# Patient Record
Sex: Male | Born: 1985
Health system: Southern US, Community
[De-identification: ages and names within clinical notes are randomized; demographics above are authoritative.]

## PROBLEM LIST (undated history)

## (undated) ENCOUNTER — Emergency Department (HOSPITAL_COMMUNITY): Admission: EM | Payer: Self-pay | Source: Home / Self Care

## (undated) DIAGNOSIS — S3992XA Unspecified injury of lower back, initial encounter: Secondary | ICD-10-CM

## (undated) DIAGNOSIS — R569 Unspecified convulsions: Secondary | ICD-10-CM

## (undated) HISTORY — DX: Unspecified convulsions: R56.9

---
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
## 2010-07-06 ENCOUNTER — Encounter: Payer: Self-pay | Admitting: Sports Medicine

## 2011-05-10 ENCOUNTER — Emergency Department (HOSPITAL_BASED_OUTPATIENT_CLINIC_OR_DEPARTMENT_OTHER)
Admission: EM | Admit: 2011-05-10 | Discharge: 2011-05-10 | Disposition: A | Discharge status: Home / Self Care | Payer: BC Managed Care – PPO | Attending: Emergency Medicine | Admitting: Emergency Medicine

## 2011-05-10 ENCOUNTER — Encounter: Payer: Self-pay | Admitting: *Deleted

## 2011-05-10 DIAGNOSIS — R112 Nausea with vomiting, unspecified: Secondary | ICD-10-CM | POA: Insufficient documentation

## 2011-05-10 DIAGNOSIS — R109 Unspecified abdominal pain: Secondary | ICD-10-CM | POA: Insufficient documentation

## 2011-05-10 LAB — URINALYSIS, ROUTINE W REFLEX MICROSCOPIC
Bilirubin Urine: NEGATIVE
Glucose, UA: NEGATIVE mg/dL
Ketones, ur: NEGATIVE mg/dL
Leukocytes, UA: NEGATIVE
Nitrite: NEGATIVE
Specific Gravity, Urine: 1.028 (ref 1.005–1.030)
pH: 7.5 (ref 5.0–8.0)

## 2011-05-10 LAB — BASIC METABOLIC PANEL
GFR calc Af Amer: >90 mL/min (ref >90)
GFR calc non Af Amer: >90 mL/min (ref >90)
Potassium: 3.8 mEq/L (ref 3.5–5.1)
Sodium: 135 mEq/L (ref 135–145)

## 2011-05-10 MED ORDER — SODIUM CHLORIDE 0.9 % IV BOLUS (SEPSIS)
1000.0000 mL | Freq: Once | INTRAVENOUS | Status: AC
  Administered 2011-05-10: 1000 mL via INTRAVENOUS

## 2011-05-10 MED ORDER — ONDANSETRON HCL 4 MG/2ML IJ SOLN
4.0000 mg | Freq: Once | INTRAMUSCULAR | Status: AC
  Administered 2011-05-10: 4 mg via INTRAVENOUS
  Filled 2011-05-10: qty 2

## 2011-05-10 MED ORDER — ONDANSETRON 4 MG PO TBDP: 4.0000 mg | ORAL_TABLET | Freq: Three times a day (TID) | ORAL | Status: AC | PRN

## 2011-05-10 NOTE — ED Notes (Signed)
Patient states that the nausea has subsided and does not feel dizzy

## 2011-05-10 NOTE — ED Notes (Signed)
Patient drinking ginger ale w/o nausea or vomiting

## 2011-05-10 NOTE — ED Provider Notes (Signed)
Medical screening examination/treatment/procedure(s) were performed by non-physician practitioner and as supervising physician I was immediately available for consultation/collaboration.   Geoffery Lyons, MD 05/10/11 [phone]

## 2011-05-10 NOTE — ED Provider Notes (Signed)
History     CSN: [id number] Arrival date & time: 05/10/2011  3:57 PM   First MD Initiated Contact with Patient 05/10/11 1601      Chief Complaint  Patient presents with  . Abdominal Pain    (Consider location/radiation/quality/duration/timing/severity/associated sxs/prior treatment) HPI Comments: Pt states that she is having some generalized abdominal cramping:pt states that he is having decreased vomiting over the last couple of hours  Patient is a 25 y.o. male presenting with abdominal pain. The history is provided by the patient. No language interpreter was used.  Abdominal Pain The primary symptoms of the illness include abdominal pain, nausea, vomiting and diarrhea. The primary symptoms of the illness do not include fever or dysuria. The current episode started yesterday. The onset of the illness was sudden. The problem has been gradually improving.  The patient has not had a change in bowel habit. Symptoms associated with the illness do not include chills, frequency or back pain.    History reviewed. No pertinent past medical history.  History reviewed. No pertinent past surgical history.  History reviewed. No pertinent family history.  History  Substance Use Topics  . Smoking status: Not on file  . Smokeless tobacco: Not on file  . Alcohol Use: Not on file      Review of Systems  Constitutional: Negative for fever and chills.  Gastrointestinal: Positive for nausea, vomiting, abdominal pain and diarrhea.  Genitourinary: Negative for dysuria and frequency.  Musculoskeletal: Negative for back pain.  All other systems reviewed and are negative.    Allergies  Review of patient's allergies indicates no known allergies.  Home Medications  No current outpatient prescriptions on file.  BP 139/82  Pulse 96  Temp(Src) 99 F (37.2 C) (Oral)  Resp 20  Ht 6\' 2"  (1.88 m)  Wt 281 lb 9 oz (127.716 kg)  BMI 36.15 kg/m2  SpO2 99%  Physical Exam  Nursing note and  vitals reviewed. Constitutional: He is oriented to person, place, and time. He appears well-developed and well-nourished.  HENT:  Head: Normocephalic and atraumatic.  Neck: Normal range of motion.  Cardiovascular: Normal rate and regular rhythm.   Pulmonary/Chest: Effort normal and breath sounds normal.  Abdominal: Soft. Bowel sounds are normal.  Musculoskeletal: Normal range of motion.  Neurological: He is alert and oriented to person, place, and time.  Skin: Skin is warm and dry.  Psychiatric: He has a normal mood and affect.    ED Course  Procedures (including critical care time)   Labs Reviewed  URINALYSIS, ROUTINE W REFLEX MICROSCOPIC  BASIC METABOLIC PANEL   No results found.   1. Nausea vomiting and diarrhea       MDM  Pt is tolerating po at this time:pt feeling better:abdomen non tender with palpation:symptoms likely viral        Teressa Lower, NP 05/10/11 1813

## 2011-05-10 NOTE — ED Notes (Signed)
Pt states he has had abd pain, N/V/D since last p.m.

## 2011-09-07 ENCOUNTER — Encounter (HOSPITAL_BASED_OUTPATIENT_CLINIC_OR_DEPARTMENT_OTHER): Payer: Self-pay | Admitting: *Deleted

## 2011-09-07 ENCOUNTER — Emergency Department (HOSPITAL_BASED_OUTPATIENT_CLINIC_OR_DEPARTMENT_OTHER)
Admission: EM | Admit: 2011-09-07 | Discharge: 2011-09-07 | Disposition: A | Discharge status: Home / Self Care | Payer: Self-pay | Attending: Emergency Medicine | Admitting: Emergency Medicine

## 2011-09-07 DIAGNOSIS — K529 Noninfective gastroenteritis and colitis, unspecified: Secondary | ICD-10-CM

## 2011-09-07 DIAGNOSIS — K5289 Other specified noninfective gastroenteritis and colitis: Secondary | ICD-10-CM | POA: Insufficient documentation

## 2011-09-07 MED ORDER — ONDANSETRON 8 MG PO TBDP
8.0000 mg | ORAL_TABLET | Freq: Once | ORAL | Status: AC
  Administered 2011-09-07: 8 mg via ORAL
  Filled 2011-09-07: qty 1

## 2011-09-07 MED ORDER — ONDANSETRON 8 MG PO TBDP: 8.0000 mg | ORAL_TABLET | Freq: Three times a day (TID) | ORAL | Status: AC | PRN

## 2011-09-07 MED ORDER — DIPHENOXYLATE-ATROPINE 2.5-0.025 MG PO TABS
2.0000 | ORAL_TABLET | Freq: Once | ORAL | Status: AC
  Administered 2011-09-07: 2 via ORAL
  Filled 2011-09-07: qty 2

## 2011-09-07 MED ORDER — DIPHENOXYLATE-ATROPINE 2.5-0.025 MG PO TABS: 1.0000 | ORAL_TABLET | Freq: Four times a day (QID) | ORAL | Status: AC | PRN

## 2011-09-07 NOTE — ED Notes (Signed)
Patient states he has had N/V/D since yesterday. Unable to keep anything down.

## 2011-09-07 NOTE — ED Provider Notes (Signed)
History     CSN: [id number]  Arrival date & time 09/07/11  0304   First MD Initiated Contact with Patient 09/07/11 [phone]      Chief Complaint  Patient presents with  . Nausea, vomiting and diarrhea     (Consider location/radiation/quality/duration/timing/severity/associated sxs/prior treatment) HPI Is a 26 year old black male with a history of nausea, vomiting and diarrhea since yesterday. He has not been able to keep anything down. He has not taken anything to treat this. He has had some abdominal cramping but this is improved. The symptoms overall are moderate in severity. He states the symptoms have kept him from sleeping this morning and that is why he is here. He requests only treatment by mouth and does not wish an IV.  History reviewed. No pertinent past medical history.  History reviewed. No pertinent past surgical history.  No family history on file.  History  Substance Use Topics  . Smoking status: Not on file  . Smokeless tobacco: Not on file  . Alcohol Use: Not on file      Review of Systems  All other systems reviewed and are negative.    Allergies  Review of patient's allergies indicates no known allergies.  Home Medications  No current outpatient prescriptions on file.  BP 142/75  Pulse 96  Temp(Src) 98.3 F (36.8 C) (Oral)  Resp 20  SpO2 98%  Physical Exam General: Well-developed, well-nourished male in no acute distress; appearance consistent with age of record HENT: normocephalic, atraumatic; mucous membranes moist Eyes: pupils equal round and reactive to light; extraocular muscles intact Neck: supple Heart: regular rate and rhythm Lungs: clear to auscultation bilaterally Abdomen: soft; nondistended; nontender; no masses or hepatosplenomegaly; bowel sounds present Extremities: No deformity; full range of motion; pulses normal Neurologic: Awake, alert and oriented; motor function intact in all extremities and symmetric; no facial  droop Skin: Warm and dry     ED Course  Procedures (including critical care time)     MDM  4:28 AM Drinking fluids without emesis.        Hanley Seamen, MD 09/07/11 [phone]

## 2011-09-19 ENCOUNTER — Encounter (HOSPITAL_BASED_OUTPATIENT_CLINIC_OR_DEPARTMENT_OTHER): Payer: Self-pay | Admitting: Emergency Medicine

## 2011-09-19 ENCOUNTER — Emergency Department (HOSPITAL_BASED_OUTPATIENT_CLINIC_OR_DEPARTMENT_OTHER)
Admission: EM | Admit: 2011-09-19 | Discharge: 2011-09-19 | Disposition: A | Discharge status: Home / Self Care | Payer: Self-pay | Attending: Emergency Medicine | Admitting: Emergency Medicine

## 2011-09-19 ENCOUNTER — Emergency Department (INDEPENDENT_AMBULATORY_CARE_PROVIDER_SITE_OTHER): Payer: Self-pay

## 2011-09-19 DIAGNOSIS — M546 Pain in thoracic spine: Secondary | ICD-10-CM | POA: Insufficient documentation

## 2011-09-19 DIAGNOSIS — M549 Dorsalgia, unspecified: Secondary | ICD-10-CM

## 2011-09-19 DIAGNOSIS — M545 Low back pain, unspecified: Secondary | ICD-10-CM | POA: Insufficient documentation

## 2011-09-19 DIAGNOSIS — M8448XA Pathological fracture, other site, initial encounter for fracture: Secondary | ICD-10-CM

## 2011-09-19 HISTORY — DX: Unspecified injury of lower back, initial encounter: S39.92XA

## 2011-09-19 MED ORDER — NAPROXEN 500 MG PO TABS: 500.0000 mg | ORAL_TABLET | Freq: Two times a day (BID) | ORAL | Status: AC

## 2011-09-19 MED ORDER — CYCLOBENZAPRINE HCL 5 MG PO TABS: 5.0000 mg | ORAL_TABLET | Freq: Three times a day (TID) | ORAL | Status: AC | PRN

## 2011-09-19 NOTE — ED Provider Notes (Signed)
History     CSN: [id number]  Arrival date & time 09/19/11  0865   First MD Initiated Contact with Patient 09/19/11 [phone]      Chief Complaint  Patient presents with  . Back Pain    (Consider location/radiation/quality/duration/timing/severity/associated sxs/prior treatment) HPI Pt states he has had pain in his back four days ago.  No known recent injury.  No numbness or tingling.  No difficulty with elimination.  The pain increases with movement.  NO trouble breathing, no  Shortness of breath or cough.  Pt has history of stress fractures in his vertebrae.   Pt was told it could have been from an accident in the past.  He had been seeing a chiropractor who gave him the diagnosis.  The pain is a 6/10 Past Medical History  Diagnosis Date  . Back injuries     History reviewed. No pertinent past surgical history.  History reviewed. No pertinent family history.  History  Substance Use Topics  . Smoking status: Current Some Day Smoker    Types: Cigars  . Smokeless tobacco: Not on file  . Alcohol Use: Yes     occasional      Review of Systems  All other systems reviewed and are negative.    Allergies  Review of patient's allergies indicates no known allergies.  Home Medications  No current outpatient prescriptions on file.  BP 132/82  Pulse 83  Temp(Src) 98.4 F (36.9 C) (Oral)  Resp 18  SpO2 99%  Physical Exam  Nursing note and vitals reviewed. Constitutional: He appears well-developed and well-nourished. No distress.  HENT:  Head: Normocephalic and atraumatic. Head is without raccoon's eyes and without Battle's sign.  Right Ear: External ear normal.  Left Ear: External ear normal.  Eyes: Conjunctivae and lids are normal. Right eye exhibits no discharge. Left eye exhibits no discharge. Right conjunctiva has no hemorrhage. Left conjunctiva has no hemorrhage. No scleral icterus.  Neck: Neck supple. No spinous process tenderness present. No tracheal deviation and no  edema present.  Pulmonary/Chest: Effort normal and breath sounds normal. No stridor. No respiratory distress. He exhibits no tenderness, no crepitus and no deformity.  Abdominal: Soft. Normal appearance and bowel sounds are normal. He exhibits no distension and no mass. There is no tenderness.  Musculoskeletal: He exhibits no edema and no tenderness.       Cervical back: He exhibits no tenderness, no swelling and no deformity.       Thoracic back: He exhibits tenderness. He exhibits no swelling and no deformity.       Lumbar back: He exhibits tenderness. He exhibits no swelling.       Back:       Pelvis stable, no ttp  Neurological: He is alert. He has normal strength. No cranial nerve deficit (no gross deficits) or sensory deficit. He exhibits normal muscle tone. GCS eye subscore is 4. GCS verbal subscore is 5. GCS motor subscore is 6.       Able to move all extremities, sensation intact throughout  Skin: Skin is warm and dry. No rash noted. He is not diaphoretic.  Psychiatric: He has a normal mood and affect. His speech is normal and behavior is normal.    ED Course  Procedures (including critical care time)  Labs Reviewed - No data to display Dg Thoracic Spine 2 View  09/19/2011  *RADIOLOGY REPORT*  Clinical Data: Back pain for 4 days, no recent injury  THORACIC SPINE - 2 VIEW  Comparison:  None.  Findings: Four views of thoracic spine submitted. Alignment and disc space are preserved.  There is mild decreased height of probable T8 and T10 vertebral body of indeterminate age.  Clinical correlation is necessary.  If recent compression deformities are suspected further evaluation with MRI is recommended.  IMPRESSION: Alignment and disc space are preserved.  There is mild decreased height of probable T8 and T10 vertebral body of indeterminate age. Clinical correlation is necessary.  If recent compression deformities are suspected further evaluation with MRI is recommended.  Original Report  Authenticated By: Natasha Mead, M.D.   Dg Lumbar Spine Complete  09/19/2011  *RADIOLOGY REPORT*  Clinical Data: 26 year old male with mid and low back pain.  Prior stress fractures.  LUMBAR SPINE - COMPLETE 4+ VIEW  Comparison: [facility] MRI 08/13/2005. Lumbar radiographs 07/30/2005.  Findings: Normal lumbar segmentation.  Chronic bilateral L5 pars fractures.  Stable lumbar height and alignment, no significant spondylolisthesis at L5-S1.  Relatively preserved disc space. Bone mineralization is within normal limits.  No other pars fracture. Negative SI joints.  IMPRESSION: No acute osseous abnormality in the lumbar spine.  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  Original Report Authenticated By: Harley Hallmark, M.D.     No diagnosis found.    MDM  The patient is noted to have compression fractures of T8 and T10. Patient is somewhat tenderness area every he states he has history of compression fractures in the same spot. I suspect these findings on the plain films are related to his prior diagnosis. Patient does have a sports medicine doctor that he sees.  I recommended he followup with his doctor so that they can review his old films and compared them to the ones performed today.       Celene Kras, MD 09/19/11 1102

## 2011-09-19 NOTE — ED Notes (Signed)
Pt having mid back pain x 4 days.  No known injury.  No difficulty with elimination.  No numbness or tingling.

## 2011-09-19 NOTE — Discharge Instructions (Signed)
Back, Compression Fracture A compression fracture happens when a force is put upon the length of your spine. Slipping and falling on your bottom are examples of such a force. When this happens, sometimes the force is great enough to compress the building blocks (vertebral bodies) of your spine. Although this causes a lot of pain, this can usually be treated at home, unless your caregiver feels hospitalization is needed for pain control. Your backbone (spinal column) is made up of 24 main vertebral bodies in addition to the sacrum and coccyx (see illustration). These are held together by tough fibrous tissues (ligaments) and by support of your muscles. Nerve roots pass through the openings between the vertebrae. A sudden wrenching move, injury, or a fall may cause a compression fracture of one of the vertebral bodies. This may result in back pain or spread of pain into the belly (abdomen), the buttocks, and down the leg into the foot. Pain may also be created by muscle spasm alone. Large studies have been undertaken to determine the best possible course of action to help your back following injury and also to prevent future problems. The recommendations are as follows. FOLLOWING A COMPRESSION FRACTURE: Do the following only if advised by your caregiver.   If a back brace has been suggested or provided, wear it as directed.   DO NOT stop wearing the back brace unless instructed by your caregiver.   When allowed to return to regular activities, avoid a sedentary life style. Actively exercise. Sporadic weekend binges of tennis, racquetball, water skiing, may actually aggravate or create problems, especially if you are not in condition for that activity.   Avoid sports requiring sudden body movements until you are in condition for them. Swimming and walking are safer activities.   Maintain good posture.   Avoid obesity.   If not already done, you should have a DEXA scan. Based on the results, be  treated for osteoporosis.  FOLLOWING ACUTE (SUDDEN) INJURY:  Only take over-the-counter or prescription medicines for pain, discomfort, or fever as directed by your caregiver.   Use bed rest for only the most extreme acute episode. Prolonged bed rest may aggravate your condition. Ice used for acute conditions is effective. Use a large plastic bag filled with ice. Wrap it in a towel. This also provides excellent pain relief. This may be continuous. Or use it for 30 minutes every 2 hours during acute phase, then as needed. Heat for 30 minutes prior to activities is helpful.   As soon as the acute phase (the time when your back is too painful for you to do normal activities) is over, it is important to resume normal activities and work Arboriculturist. Back injuries can cause potentially marked changes in lifestyle. So it is important to attack these problems aggressively.   See your caregiver for continued problems. He or she can help or refer you for appropriate exercises, physical therapy and work hardening if needed.   If you are given narcotic medications for your condition, for the next 24 hours DO NOT:   Drive   Operate machinery or power tools.   Sign legal documents.   DO NOT drink alcohol, take sleeping pills or other medications that may interfere with treatment.  If your caregiver has given you a follow-up appointment, it is very important to keep that appointment. Not keeping the appointment could result in a chronic or permanent injury, pain, and disability. If there is any problem keeping the appointment, you must call  back to this facility for assistance.  SEEK IMMEDIATE MEDICAL CARE IF:  You develop numbness, tingling, weakness, or problems with the use of your arms or legs.   You develop severe back pain not relieved with medications.   You have changes in bowel or bladder control.   You have increasing pain in any areas of the body.  Document Released: 06/01/2005  Document Revised: 05/21/2011 Document Reviewed: 01/04/2008 [facility] Patient Information 2012 [place], [place].

## 2011-11-03 ENCOUNTER — Emergency Department (HOSPITAL_BASED_OUTPATIENT_CLINIC_OR_DEPARTMENT_OTHER)
Admission: EM | Admit: 2011-11-03 | Discharge: 2011-11-03 | Disposition: A | Discharge status: Home / Self Care | Payer: Self-pay | Attending: Emergency Medicine | Admitting: Emergency Medicine

## 2011-11-03 ENCOUNTER — Encounter (HOSPITAL_BASED_OUTPATIENT_CLINIC_OR_DEPARTMENT_OTHER): Payer: Self-pay | Admitting: *Deleted

## 2011-11-03 DIAGNOSIS — S0502XA Injury of conjunctiva and corneal abrasion without foreign body, left eye, initial encounter: Secondary | ICD-10-CM

## 2011-11-03 DIAGNOSIS — S058X9A Other injuries of unspecified eye and orbit, initial encounter: Secondary | ICD-10-CM | POA: Insufficient documentation

## 2011-11-03 DIAGNOSIS — X58XXXA Exposure to other specified factors, initial encounter: Secondary | ICD-10-CM | POA: Insufficient documentation

## 2011-11-03 MED ORDER — FLUORESCEIN SODIUM 1 MG OP STRP
1.0000 | ORAL_STRIP | Freq: Once | OPHTHALMIC | Status: AC
  Administered 2011-11-03: 1 via OPHTHALMIC
  Filled 2011-11-03: qty 1

## 2011-11-03 MED ORDER — TRAMADOL HCL 50 MG PO TABS: 50.0000 mg | ORAL_TABLET | Freq: Four times a day (QID) | ORAL | Status: AC | PRN

## 2011-11-03 MED ORDER — TETRACAINE HCL 0.5 % OP SOLN
2.0000 [drp] | Freq: Once | OPHTHALMIC | Status: AC
  Administered 2011-11-03: 2 [drp] via OPHTHALMIC
  Filled 2011-11-03: qty 2

## 2011-11-03 MED ORDER — ERYTHROMYCIN 5 MG/GM OP OINT: TOPICAL_OINTMENT | OPHTHALMIC | Status: AC

## 2011-11-03 NOTE — Discharge Instructions (Signed)
Corneal Abrasion The cornea is the clear covering at the front and center of the eye. It is a thin tissue made up of layers. The top layer is the most sensitive layer. A corneal abrasion happens if this layer is scratched or an injury causes it to come off.  HOME CARE  You may be given drops or a medicated cream. Use the medicine as told by your doctor.   A pressure patch may be put over the eye. If this is done, follow your doctor's instructions for when to remove the patch. Do not drive or use machines while the eye patch is on. Judging distances is hard to do with a patch on.   See your doctor for a follow-up exam if you are told to do so.  GET HELP RIGHT AWAY IF:   The pain is getting worse or is very bad.   The eye is very sensitive to light.   Any liquid comes out of the injured eye after treatment.   Your vision suddenly gets worse.   You have a sudden loss of vision or blindness.  MAKE SURE YOU:   Understand these instructions.   Will watch your condition.   Will get help right away if you are not doing well or get worse.  Document Released: 11/18/2007 Document Revised: 05/21/2011 Document Reviewed: 11/18/2007 [employer] Patient Information 2012 [employer]. 

## 2011-11-03 NOTE — ED Notes (Signed)
Patient states he developed right eye redness 2 days ago.  Started as both eyes being sensitive to light, now right is worse than left.  Denies any drainage or injury

## 2011-11-03 NOTE — ED Provider Notes (Addendum)
History     CSN: [id number]  Arrival date & time 11/03/11  1012   First MD Initiated Contact with Patient 11/03/11 1057      Chief Complaint  Patient presents with  . Conjunctivitis    (Consider location/radiation/quality/duration/timing/severity/associated sxs/prior treatment) HPI Pt p/w l eye redness, pain, photophobia x 2 days. No fever chills, discharge, visual changes, trauma. No URI symptoms Past Medical History  Diagnosis Date  . Back injuries     History reviewed. No pertinent past surgical history.  No family history on file.  History  Substance Use Topics  . Smoking status: Former Games developer  . Smokeless tobacco: Not on file  . Alcohol Use: Not on file     occasional      Review of Systems  Constitutional: Negative for fever and chills.  HENT: Negative for congestion, sore throat, rhinorrhea, neck pain and sinus pressure.   Eyes: Positive for photophobia, pain and redness. Negative for discharge, itching and visual disturbance.    Allergies  Review of patient's allergies indicates no known allergies.  Home Medications   Current Outpatient Rx  Name Route Sig Dispense Refill  . ACYCLOVIR 200 MG PO CAPS Oral Take by mouth 3 (three) times daily.    . ERYTHROMYCIN 5 MG/GM OP OINT  Place a 1/2 inch ribbon of ointment into the lower eyelid 4 times daily for 5 days 1 g 0  . NAPROXEN 500 MG PO TABS Oral Take 1 tablet (500 mg total) by mouth 2 (two) times daily with a meal. As needed for pain 20 tablet 0  . TRAMADOL HCL 50 MG PO TABS Oral Take 1 tablet (50 mg total) by mouth every 6 (six) hours as needed for pain. 15 tablet 0    BP 136/86  Pulse 77  Temp(Src) 98.2 F (36.8 C) (Oral)  Resp 20  Ht 6\' 2"  (1.88 m)  Wt 313 lb (141.976 kg)  BMI 40.19 kg/m2  SpO2 98%  Physical Exam  Nursing note and vitals reviewed. Constitutional: He appears well-developed and well-nourished.  HENT:  Head: Normocephalic and atraumatic.  Mouth/Throat: Oropharynx is clear and  moist.  Eyes: EOM are normal. Pupils are equal, round, and reactive to light. Right eye exhibits no discharge. Left eye exhibits no discharge.       Conjunctival erythema L eye. Consensual photophobia. PERRL. EOMI, No visible FB.    L scleral uptake of flourocein stain possibly indicating abrasion  Neck: Normal range of motion. Neck supple.    ED Course  Procedures (including critical care time)  Labs Reviewed - No data to display No results found.   1. Corneal abrasion, left       MDM  Will treat with abx eye drops, pain meds and opthalmology f/u         Loren Racer, MD 11/03/11 1514  Loren Racer, MD 11/03/11 1515

## 2011-11-05 ENCOUNTER — Emergency Department (HOSPITAL_BASED_OUTPATIENT_CLINIC_OR_DEPARTMENT_OTHER)
Admission: EM | Admit: 2011-11-05 | Discharge: 2011-11-05 | Disposition: A | Discharge status: Home / Self Care | Payer: Self-pay | Attending: Emergency Medicine | Admitting: Emergency Medicine

## 2011-11-05 ENCOUNTER — Encounter (HOSPITAL_BASED_OUTPATIENT_CLINIC_OR_DEPARTMENT_OTHER): Payer: Self-pay | Admitting: *Deleted

## 2011-11-05 DIAGNOSIS — H109 Unspecified conjunctivitis: Secondary | ICD-10-CM | POA: Insufficient documentation

## 2011-11-05 DIAGNOSIS — H1589 Other disorders of sclera: Secondary | ICD-10-CM | POA: Insufficient documentation

## 2011-11-05 DIAGNOSIS — H159 Unspecified disorder of sclera: Secondary | ICD-10-CM

## 2011-11-05 MED ORDER — TOBRAMYCIN 0.3 % OP SOLN: 1.0000 [drp] | OPHTHALMIC | Status: AC

## 2011-11-05 MED ORDER — FLUORESCEIN SODIUM 1 MG OP STRP
1.0000 | ORAL_STRIP | Freq: Once | OPHTHALMIC | Status: AC
  Administered 2011-11-05: 1 via OPHTHALMIC
  Filled 2011-11-05: qty 1

## 2011-11-05 MED ORDER — NAPROXEN 375 MG PO TABS: 375.0000 mg | ORAL_TABLET | Freq: Two times a day (BID) | ORAL | Status: AC

## 2011-11-05 MED ORDER — TETRACAINE HCL 0.5 % OP SOLN
2.0000 [drp] | Freq: Once | OPHTHALMIC | Status: AC
  Administered 2011-11-05: 2 [drp] via OPHTHALMIC
  Filled 2011-11-05: qty 2

## 2011-11-05 NOTE — ED Provider Notes (Signed)
History     CSN: [id number]  Arrival date & time 11/05/11  0235   First MD Initiated Contact with Patient 11/05/11 0244      Chief Complaint  Patient presents with  . Eye Pain    (Consider location/radiation/quality/duration/timing/severity/associated sxs/prior treatment) Patient is a 26 y.o. male presenting with eye pain and eye problem. The history is provided by the patient and a relative. No language interpreter was used.  Eye Pain This is a chronic problem. The current episode started more than 2 days ago. The problem occurs constantly. The problem has not changed since onset.Pertinent negatives include no headaches. The symptoms are aggravated by nothing. The symptoms are relieved by nothing. Treatments tried: erythromycin and ultram. The treatment provided no relief.  Eye Problem  This is a chronic problem. The current episode started more than 2 days ago. The problem occurs constantly. The problem has not changed since onset.There is pain in the left eye. There was no injury mechanism. The pain is at a severity of 10/10. The pain is severe. There is no history of trauma to the eye. There is no known exposure to pink eye. He does not wear contacts. Associated symptoms include photophobia and eye redness. Pertinent negatives include no discharge, no double vision and no vomiting. Treatments tried: erythromycin and ultram. The treatment provided no relief.    Past Medical History  Diagnosis Date  . Back injuries     History reviewed. No pertinent past surgical history.  History reviewed. No pertinent family history.  History  Substance Use Topics  . Smoking status: Former Games developer  . Smokeless tobacco: Not on file  . Alcohol Use: Not on file     occasional      Review of Systems  Constitutional: Negative for fever.  HENT: Negative for neck pain.   Eyes: Positive for photophobia, pain and redness. Negative for double vision, discharge and visual disturbance.    Gastrointestinal: Negative for vomiting.  Skin: Negative.   Neurological: Negative for headaches.  All other systems reviewed and are negative.    Allergies  Review of patient's allergies indicates no known allergies.  Home Medications   Current Outpatient Rx  Name Route Sig Dispense Refill  . ACYCLOVIR 200 MG PO CAPS Oral Take by mouth 3 (three) times daily.    . ERYTHROMYCIN 5 MG/GM OP OINT  Place a 1/2 inch ribbon of ointment into the lower eyelid 4 times daily for 5 days 1 g 0  . NAPROXEN 500 MG PO TABS Oral Take 1 tablet (500 mg total) by mouth 2 (two) times daily with a meal. As needed for pain 20 tablet 0  . TRAMADOL HCL 50 MG PO TABS Oral Take 1 tablet (50 mg total) by mouth every 6 (six) hours as needed for pain. 15 tablet 0    BP 137/70  Pulse 81  Temp(Src) 98.3 F (36.8 C) (Oral)  Resp 18  SpO2 98%  Physical Exam  Constitutional: He is oriented to person, place, and time. He appears well-developed and well-nourished.  HENT:  Head: Normocephalic and atraumatic.  Eyes: EOM are normal. Pupils are equal, round, and reactive to light. Right eye exhibits no chemosis, no discharge and no exudate. Left eye exhibits no chemosis, no discharge and no exudate. No foreign body present in the left eye. Left conjunctiva is injected. Left conjunctiva has no hemorrhage. No scleral icterus. Left eye exhibits normal extraocular motion. Left pupil is reactive.  Fundoscopic exam:  The left eye shows no arteriolar narrowing and no exudate.  Slit lamp exam:      The left eye shows no corneal flare, no corneal ulcer and no hyphema.       Abrasion at 4 oclock position but on the sclera and not corneal.  Cornea not hazy pupil not middilated.  Tonopen reading 7  Neck: Normal range of motion. Neck supple.  Cardiovascular: Normal rate and regular rhythm.   Pulmonary/Chest: Effort normal and breath sounds normal.  Abdominal: Soft. Bowel sounds are normal.  Neurological: He is alert and  oriented to person, place, and time.  Skin: Skin is warm and dry.  Psychiatric: He has a normal mood and affect.    ED Course  Procedures (including critical care time)  Labs Reviewed - No data to display No results found.   No diagnosis found.    MDM  Scleral abrasion.  Conjunctivitis.  Will change antibiotics.  Patient informed it is imperative that he follow up with ophtholmology (eye doctor) for further evaluation and care as this is a specialist and has testing not available in the ED.  Patient and family verbalize understanding and agree to follow up        Avory Mimbs Smitty Cords, MD 11/05/11 [phone]

## 2011-11-05 NOTE — ED Notes (Signed)
Pt c/o continued pain after being diagnosed with corneal abrasion

## 2011-11-05 NOTE — Discharge Instructions (Signed)
Conjunctivitis Conjunctivitis is commonly called "pink eye." Conjunctivitis can be caused by bacterial or viral infection, allergies, or injuries. There is usually redness of the lining of the eye, itching, discomfort, and sometimes discharge. There may be deposits of matter along the eyelids. A viral infection usually causes a watery discharge, while a bacterial infection causes a yellowish, thick discharge. Pink eye is very contagious and spreads by direct contact. You may be given antibiotic eyedrops as part of your treatment. Before using your eye medicine, remove all drainage from the eye by washing gently with warm water and cotton balls. Continue to use the medication until you have awakened 2 mornings in a row without discharge from the eye. Do not rub your eye. This increases the irritation and helps spread infection. Use separate towels from other household members. Wash your hands with soap and water before and after touching your eyes. Use cold compresses to reduce pain and sunglasses to relieve irritation from light. Do not wear contact lenses or wear eye makeup until the infection is gone. SEEK MEDICAL CARE IF:   Your symptoms are not better after 3 days of treatment.   You have increased pain or trouble seeing.   The outer eyelids become very red or swollen.  Document Released: 07/09/2004 Document Revised: 05/21/2011 Document Reviewed: 06/01/2005 Exit[employer] Patient Information 2012 [employer]. 

## 2011-11-05 NOTE — ED Notes (Signed)
MD at bedside, eye exams performed using woods lamp and slit lamp. Pt tolerated well.

## 2011-11-05 NOTE — ED Notes (Signed)
Performed an eye acuity exam as results were: Left Eye: 20/50 Right Eye: 20/25 Both Eyes: 20/30

## 2012-08-07 ENCOUNTER — Emergency Department (HOSPITAL_BASED_OUTPATIENT_CLINIC_OR_DEPARTMENT_OTHER)
Admission: EM | Admit: 2012-08-07 | Discharge: 2012-08-07 | Disposition: A | Discharge status: Home / Self Care | Payer: No Typology Code available for payment source | Attending: Emergency Medicine | Admitting: Emergency Medicine

## 2012-08-07 ENCOUNTER — Encounter (HOSPITAL_BASED_OUTPATIENT_CLINIC_OR_DEPARTMENT_OTHER): Payer: Self-pay | Admitting: *Deleted

## 2012-08-07 DIAGNOSIS — Z87828 Personal history of other (healed) physical injury and trauma: Secondary | ICD-10-CM | POA: Insufficient documentation

## 2012-08-07 DIAGNOSIS — B9789 Other viral agents as the cause of diseases classified elsewhere: Secondary | ICD-10-CM | POA: Insufficient documentation

## 2012-08-07 DIAGNOSIS — R112 Nausea with vomiting, unspecified: Secondary | ICD-10-CM | POA: Insufficient documentation

## 2012-08-07 DIAGNOSIS — R059 Cough, unspecified: Secondary | ICD-10-CM | POA: Insufficient documentation

## 2012-08-07 DIAGNOSIS — R05 Cough: Secondary | ICD-10-CM | POA: Insufficient documentation

## 2012-08-07 DIAGNOSIS — B349 Viral infection, unspecified: Secondary | ICD-10-CM

## 2012-08-07 DIAGNOSIS — IMO0001 Reserved for inherently not codable concepts without codable children: Secondary | ICD-10-CM | POA: Insufficient documentation

## 2012-08-07 DIAGNOSIS — R6889 Other general symptoms and signs: Secondary | ICD-10-CM | POA: Insufficient documentation

## 2012-08-07 DIAGNOSIS — J069 Acute upper respiratory infection, unspecified: Secondary | ICD-10-CM | POA: Insufficient documentation

## 2012-08-07 DIAGNOSIS — R5381 Other malaise: Secondary | ICD-10-CM | POA: Insufficient documentation

## 2012-08-07 DIAGNOSIS — J029 Acute pharyngitis, unspecified: Secondary | ICD-10-CM | POA: Insufficient documentation

## 2012-08-07 DIAGNOSIS — M2550 Pain in unspecified joint: Secondary | ICD-10-CM | POA: Insufficient documentation

## 2012-08-07 DIAGNOSIS — Z87891 Personal history of nicotine dependence: Secondary | ICD-10-CM | POA: Insufficient documentation

## 2012-08-07 DIAGNOSIS — Z79899 Other long term (current) drug therapy: Secondary | ICD-10-CM | POA: Insufficient documentation

## 2012-08-07 DIAGNOSIS — R509 Fever, unspecified: Secondary | ICD-10-CM | POA: Insufficient documentation

## 2012-08-07 DIAGNOSIS — R197 Diarrhea, unspecified: Secondary | ICD-10-CM | POA: Insufficient documentation

## 2012-08-07 MED ORDER — PROMETHAZINE HCL 25 MG/ML IJ SOLN
25.0000 mg | Freq: Once | INTRAMUSCULAR | Status: DC
  Filled 2012-08-07: qty 1

## 2012-08-07 MED ORDER — PROMETHAZINE HCL 25 MG PO TABS: 25.0000 mg | ORAL_TABLET | Freq: Four times a day (QID) | ORAL | Status: DC | PRN

## 2012-08-07 MED ORDER — KETOROLAC TROMETHAMINE 60 MG/2ML IM SOLN
60.0000 mg | Freq: Once | INTRAMUSCULAR | Status: AC
  Administered 2012-08-07: 60 mg via INTRAMUSCULAR
  Filled 2012-08-07: qty 2

## 2012-08-07 NOTE — ED Provider Notes (Signed)
History     CSN: [id number]  Arrival date & time 08/07/12  1111   First MD Initiated Contact with Patient 08/07/12 1148      Chief Complaint  Patient presents with  . Headache    (Consider location/radiation/quality/duration/timing/severity/associated sxs/prior treatment) HPI Comments: Patient presents here with a several day history of headaches, body aches, chills, n/v/d, sore throat, and cough that is non-productive.  He denies ill contacts.  Low grade fevers at home.    Patient is a 27 y.o. male presenting with URI. The history is provided by the patient.  URI Presenting symptoms: cough, fatigue, fever and sore throat   Severity:  Moderate Duration:  4 days Timing:  Constant Progression:  Worsening Chronicity:  New Relieved by:  Nothing Worsened by:  Nothing tried Ineffective treatments:  None tried Associated symptoms: arthralgias, headaches and myalgias   Associated symptoms: no neck pain and no sinus pain     Past Medical History  Diagnosis Date  . Back injuries     History reviewed. No pertinent past surgical history.  History reviewed. No pertinent family history.  History  Substance Use Topics  . Smoking status: Former Games developer  . Smokeless tobacco: Not on file  . Alcohol Use: Not on file     Comment: occasional      Review of Systems  Constitutional: Positive for fever, chills and fatigue.  HENT: Positive for sore throat. Negative for neck pain.   Respiratory: Positive for cough.   Gastrointestinal: Positive for nausea, vomiting and diarrhea.  Genitourinary: Negative for dysuria.  Musculoskeletal: Positive for myalgias and arthralgias.  Neurological: Positive for headaches.  All other systems reviewed and are negative.    Allergies  Review of patient's allergies indicates no known allergies.  Home Medications   Current Outpatient Rx  Name  Route  Sig  Dispense  Refill  . acyclovir (ZOVIRAX) 200 MG capsule   Oral   Take by mouth 3  (three) times daily.         . naproxen (NAPROSYN) 375 MG tablet   Oral   Take 1 tablet (375 mg total) by mouth 2 (two) times daily with a meal.   14 tablet   0   . naproxen (NAPROSYN) 500 MG tablet   Oral   Take 1 tablet (500 mg total) by mouth 2 (two) times daily with a meal. As needed for pain   20 tablet   0     BP 158/58  Pulse 77  Temp(Src) 98.2 F (36.8 C) (Oral)  Resp 18  Ht 6\' 2"  (1.88 m)  Wt 308 lb (139.708 kg)  BMI 39.53 kg/m2  SpO2 98%  Physical Exam  Nursing note and vitals reviewed. Constitutional: He is oriented to person, place, and time. He appears well-developed and well-nourished. No distress.  HENT:  Head: Normocephalic and atraumatic.  Mouth/Throat: Oropharynx is clear and moist. No oropharyngeal exudate.  Neck: Normal range of motion. Neck supple.  Cardiovascular: Normal rate and regular rhythm.   No murmur heard. Pulmonary/Chest: Effort normal and breath sounds normal. No respiratory distress. He has no wheezes.  Abdominal: Soft. Bowel sounds are normal. He exhibits no distension. There is no tenderness.  Musculoskeletal: Normal range of motion. He exhibits no edema.  Neurological: He is alert and oriented to person, place, and time.  Skin: Skin is warm and dry. He is not diaphoretic.    ED Course  Procedures (including critical care time)  Labs Reviewed - No data to display  No results found.   No diagnosis found.    MDM  Seems viral in nature.  Offered IV hydration and labs, however patient does no want this.  He says it made him feel worse the last time he had this.  Will give toradol and phenergan, discharge with po phenergan.  Return prn.        Geoffery Lyons, MD 08/07/12 1158

## 2012-08-07 NOTE — ED Notes (Signed)
Pt describes H/A, V/D, sneezing, coughing, sore throat, weakness since Tuesday

## 2012-08-07 NOTE — ED Notes (Signed)
Patient lives in [place] and drove himself to the ER. Phenergan not given.

## 2012-12-15 ENCOUNTER — Inpatient Hospital Stay: Admit: 2012-12-15 | Payer: Self-pay | Admitting: Orthopedic Surgery

## 2012-12-15 ENCOUNTER — Encounter (HOSPITAL_COMMUNITY): Admission: EM | Disposition: A | Discharge status: Home / Self Care | Payer: Self-pay | Source: Home / Self Care

## 2012-12-15 ENCOUNTER — Emergency Department (HOSPITAL_BASED_OUTPATIENT_CLINIC_OR_DEPARTMENT_OTHER)
Admission: EM | Admit: 2012-12-15 | Discharge: 2012-12-15 | Disposition: A | Discharge status: Home / Self Care | Payer: BC Managed Care – PPO | Source: Home / Self Care | Attending: Emergency Medicine | Admitting: Emergency Medicine

## 2012-12-15 ENCOUNTER — Emergency Department (HOSPITAL_BASED_OUTPATIENT_CLINIC_OR_DEPARTMENT_OTHER): Payer: BC Managed Care – PPO

## 2012-12-15 ENCOUNTER — Observation Stay (HOSPITAL_COMMUNITY)
Admission: EM | Admit: 2012-12-15 | Discharge: 2012-12-16 | Disposition: A | Discharge status: Home / Self Care | Payer: BC Managed Care – PPO | Attending: Orthopedic Surgery | Admitting: Orthopedic Surgery

## 2012-12-15 ENCOUNTER — Encounter (HOSPITAL_COMMUNITY): Payer: Self-pay | Admitting: Anesthesiology

## 2012-12-15 ENCOUNTER — Encounter (HOSPITAL_COMMUNITY): Payer: Self-pay | Admitting: *Deleted

## 2012-12-15 ENCOUNTER — Encounter (HOSPITAL_BASED_OUTPATIENT_CLINIC_OR_DEPARTMENT_OTHER): Payer: Self-pay | Admitting: *Deleted

## 2012-12-15 ENCOUNTER — Emergency Department (HOSPITAL_COMMUNITY): Payer: BC Managed Care – PPO | Admitting: Anesthesiology

## 2012-12-15 ENCOUNTER — Encounter (HOSPITAL_BASED_OUTPATIENT_CLINIC_OR_DEPARTMENT_OTHER)
Admission: EM | Disposition: A | Discharge status: Home / Self Care | Payer: Self-pay | Source: Home / Self Care | Attending: Emergency Medicine

## 2012-12-15 DIAGNOSIS — S62109A Fracture of unspecified carpal bone, unspecified wrist, initial encounter for closed fracture: Secondary | ICD-10-CM | POA: Insufficient documentation

## 2012-12-15 DIAGNOSIS — Z87828 Personal history of other (healed) physical injury and trauma: Secondary | ICD-10-CM | POA: Insufficient documentation

## 2012-12-15 DIAGNOSIS — S5410XA Injury of median nerve at forearm level, unspecified arm, initial encounter: Secondary | ICD-10-CM | POA: Insufficient documentation

## 2012-12-15 DIAGNOSIS — Z87891 Personal history of nicotine dependence: Secondary | ICD-10-CM | POA: Insufficient documentation

## 2012-12-15 DIAGNOSIS — G5600 Carpal tunnel syndrome, unspecified upper limb: Secondary | ICD-10-CM | POA: Insufficient documentation

## 2012-12-15 DIAGNOSIS — S63095A Other dislocation of left wrist and hand, initial encounter: Secondary | ICD-10-CM

## 2012-12-15 DIAGNOSIS — R296 Repeated falls: Secondary | ICD-10-CM | POA: Insufficient documentation

## 2012-12-15 DIAGNOSIS — Y9367 Activity, basketball: Secondary | ICD-10-CM | POA: Insufficient documentation

## 2012-12-15 DIAGNOSIS — S62123A Displaced fracture of lunate [semilunar], unspecified wrist, initial encounter for closed fracture: Principal | ICD-10-CM | POA: Insufficient documentation

## 2012-12-15 DIAGNOSIS — W19XXXA Unspecified fall, initial encounter: Secondary | ICD-10-CM | POA: Insufficient documentation

## 2012-12-15 DIAGNOSIS — Y929 Unspecified place or not applicable: Secondary | ICD-10-CM | POA: Insufficient documentation

## 2012-12-15 HISTORY — PX: ORIF WRIST FRACTURE: SHX2133

## 2012-12-15 LAB — CBC
Hemoglobin: 15 g/dL (ref 13.0–17.0)
MCHC: 35.7 g/dL (ref 30.0–36.0)
RBC: 4.92 MIL/uL (ref 4.22–5.81)
WBC: 9.5 10*3/uL (ref 4.0–10.5)

## 2012-12-15 LAB — BASIC METABOLIC PANEL
CO2: 27 mEq/L (ref 19–32)
GFR calc non Af Amer: 60 mL/min — ABNORMAL LOW (ref >90)
Glucose, Bld: 108 mg/dL — ABNORMAL HIGH (ref 70–99)
Potassium: 4.7 mEq/L (ref 3.5–5.1)
Sodium: 140 mEq/L (ref 135–145)

## 2012-12-15 SURGERY — OPEN REDUCTION INTERNAL FIXATION (ORIF) WRIST FRACTURE
Anesthesia: General | Site: Wrist | Laterality: Left | Wound class: Clean

## 2012-12-15 SURGERY — OPEN REDUCTION INTERNAL FIXATION (ORIF) WRIST FRACTURE
Anesthesia: General | Laterality: Left

## 2012-12-15 MED ORDER — METHOCARBAMOL 500 MG PO TABS
ORAL_TABLET | ORAL | Status: AC
  Filled 2012-12-15: qty 1

## 2012-12-15 MED ORDER — HYDROMORPHONE HCL PF 1 MG/ML IJ SOLN
0.2500 mg | INTRAMUSCULAR | Status: DC | PRN
  Administered 2012-12-15 (×4): 0.5 mg via INTRAVENOUS

## 2012-12-15 MED ORDER — METHOCARBAMOL 500 MG PO TABS
500.0000 mg | ORAL_TABLET | Freq: Four times a day (QID) | ORAL | Status: DC | PRN
  Administered 2012-12-15 – 2012-12-16 (×3): 500 mg via ORAL
  Filled 2012-12-15 (×3): qty 1

## 2012-12-15 MED ORDER — PROPOFOL 10 MG/ML IV BOLUS
INTRAVENOUS | Status: DC | PRN
  Administered 2012-12-15: 150 mg via INTRAVENOUS

## 2012-12-15 MED ORDER — HYDROMORPHONE HCL PF 1 MG/ML IJ SOLN
0.5000 mg | Freq: Once | INTRAMUSCULAR | Status: AC
  Administered 2012-12-15: 0.5 mg via INTRAVENOUS
  Filled 2012-12-15: qty 1

## 2012-12-15 MED ORDER — ONDANSETRON HCL 4 MG/2ML IJ SOLN: 4.0000 mg | Freq: Once | INTRAMUSCULAR | Status: AC | PRN

## 2012-12-15 MED ORDER — CEFAZOLIN SODIUM-DEXTROSE 2-3 GM-% IV SOLR
INTRAVENOUS | Status: DC | PRN
  Administered 2012-12-15: 2 g via INTRAVENOUS

## 2012-12-15 MED ORDER — OXYCODONE HCL 5 MG PO TABS: 5.0000 mg | ORAL_TABLET | Freq: Once | ORAL | Status: AC | PRN

## 2012-12-15 MED ORDER — METHOCARBAMOL 100 MG/ML IJ SOLN: 500.0000 mg | Freq: Four times a day (QID) | INTRAVENOUS | Status: DC | PRN

## 2012-12-15 MED ORDER — NEOSTIGMINE METHYLSULFATE 1 MG/ML IJ SOLN
INTRAMUSCULAR | Status: DC | PRN
  Administered 2012-12-15: 4.25 mg via INTRAVENOUS

## 2012-12-15 MED ORDER — SUCCINYLCHOLINE CHLORIDE 20 MG/ML IJ SOLN
INTRAMUSCULAR | Status: DC | PRN
  Administered 2012-12-15: 200 mg via INTRAVENOUS

## 2012-12-15 MED ORDER — HYDROMORPHONE HCL PF 1 MG/ML IJ SOLN
INTRAMUSCULAR | Status: AC
  Filled 2012-12-15: qty 2

## 2012-12-15 MED ORDER — FENTANYL CITRATE 0.05 MG/ML IJ SOLN
INTRAMUSCULAR | Status: DC | PRN
  Administered 2012-12-15: 50 ug via INTRAVENOUS
  Administered 2012-12-15: 100 ug via INTRAVENOUS
  Administered 2012-12-15: 50 ug via INTRAVENOUS

## 2012-12-15 MED ORDER — ONDANSETRON HCL 4 MG/2ML IJ SOLN
INTRAMUSCULAR | Status: DC | PRN
  Administered 2012-12-15: 4 mg via INTRAVENOUS

## 2012-12-15 MED ORDER — DEXTROSE 5 % IV SOLN
INTRAVENOUS | Status: DC | PRN
  Administered 2012-12-15: 21:00:00 via INTRAVENOUS

## 2012-12-15 MED ORDER — MIDAZOLAM HCL 5 MG/5ML IJ SOLN
INTRAMUSCULAR | Status: DC | PRN
  Administered 2012-12-15: 2 mg via INTRAVENOUS

## 2012-12-15 MED ORDER — ONDANSETRON HCL 4 MG/2ML IJ SOLN
4.0000 mg | Freq: Once | INTRAMUSCULAR | Status: AC
  Administered 2012-12-15: 4 mg via INTRAVENOUS
  Filled 2012-12-15: qty 2

## 2012-12-15 MED ORDER — BUPIVACAINE HCL (PF) 0.25 % IJ SOLN
INTRAMUSCULAR | Status: DC | PRN
  Administered 2012-12-15: 10 mL

## 2012-12-15 MED ORDER — MEPERIDINE HCL 25 MG/ML IJ SOLN: 6.2500 mg | INTRAMUSCULAR | Status: DC | PRN

## 2012-12-15 MED ORDER — ROCURONIUM BROMIDE 100 MG/10ML IV SOLN
INTRAVENOUS | Status: DC | PRN
  Administered 2012-12-15: 20 mg via INTRAVENOUS
  Administered 2012-12-15: 15 mg via INTRAVENOUS

## 2012-12-15 MED ORDER — 0.9 % SODIUM CHLORIDE (POUR BTL) OPTIME
TOPICAL | Status: DC | PRN
  Administered 2012-12-15: 1000 mL

## 2012-12-15 MED ORDER — LACTATED RINGERS IV SOLN
INTRAVENOUS | Status: DC | PRN
  Administered 2012-12-15 (×2): via INTRAVENOUS

## 2012-12-15 MED ORDER — SODIUM CHLORIDE 0.9 % IV SOLN
INTRAVENOUS | Status: DC
  Administered 2012-12-15: 17:00:00 via INTRAVENOUS

## 2012-12-15 MED ORDER — GLYCOPYRROLATE 0.2 MG/ML IJ SOLN
INTRAMUSCULAR | Status: DC | PRN
  Administered 2012-12-15: .6 mg via INTRAVENOUS

## 2012-12-15 MED ORDER — OXYCODONE HCL 5 MG/5ML PO SOLN: 5.0000 mg | Freq: Once | ORAL | Status: AC | PRN

## 2012-12-15 MED ORDER — OXYCODONE HCL 5 MG PO TABS
5.0000 mg | ORAL_TABLET | ORAL | Status: DC | PRN
  Administered 2012-12-15 – 2012-12-16 (×4): 10 mg via ORAL
  Filled 2012-12-15 (×3): qty 2

## 2012-12-15 MED ORDER — OXYCODONE HCL 5 MG PO TABS
ORAL_TABLET | ORAL | Status: AC
  Filled 2012-12-15: qty 2

## 2012-12-15 MED ORDER — LIDOCAINE HCL (CARDIAC) 20 MG/ML IV SOLN
INTRAVENOUS | Status: DC | PRN
  Administered 2012-12-15: 100 mg via INTRAVENOUS

## 2012-12-15 MED ORDER — LIDOCAINE HCL 4 % MT SOLN
OROMUCOSAL | Status: DC | PRN
  Administered 2012-12-15: 4 mL via TOPICAL

## 2012-12-15 SURGICAL SUPPLY — 61 items
ANCHOR JUGGERKNOT 1.0 1DR 2-0 (Anchor) ×2 IMPLANT
ANCHOR JUGGERKNOT SHT 1.4 SOFT (Anchor) IMPLANT
BANDAGE ELASTIC 3 VELCRO ST LF (GAUZE/BANDAGES/DRESSINGS) ×4 IMPLANT
BANDAGE ELASTIC 4 VELCRO ST LF (GAUZE/BANDAGES/DRESSINGS) ×6 IMPLANT
BANDAGE GAUZE ELAST BULKY 4 IN (GAUZE/BANDAGES/DRESSINGS) ×2 IMPLANT
BLADE SURG ROTATE 9660 (MISCELLANEOUS) IMPLANT
BNDG ESMARK 4X9 LF (GAUZE/BANDAGES/DRESSINGS) ×2 IMPLANT
CANISTER SUCTION 2500CC (MISCELLANEOUS) ×2 IMPLANT
CLOTH BEACON ORANGE TIMEOUT ST (SAFETY) ×2 IMPLANT
CORDS BIPOLAR (ELECTRODE) ×2 IMPLANT
COVER SURGICAL LIGHT HANDLE (MISCELLANEOUS) ×2 IMPLANT
CUFF TOURNIQUET SINGLE 18IN (TOURNIQUET CUFF) IMPLANT
CUFF TOURNIQUET SINGLE 24IN (TOURNIQUET CUFF) ×2 IMPLANT
DRAIN TLS ROUND 10FR (DRAIN) IMPLANT
DRAPE OEC MINIVIEW 54X84 (DRAPES) ×2 IMPLANT
DRAPE SURG 17X23 STRL (DRAPES) ×4 IMPLANT
DRSG ADAPTIC 3X8 NADH LF (GAUZE/BANDAGES/DRESSINGS) ×4 IMPLANT
GAUZE XEROFORM 1X8 LF (GAUZE/BANDAGES/DRESSINGS) ×2 IMPLANT
GAUZE XEROFORM 5X9 LF (GAUZE/BANDAGES/DRESSINGS) ×4 IMPLANT
GLOVE BIOGEL M STRL SZ7.5 (GLOVE) ×2 IMPLANT
GLOVE SS BIOGEL STRL SZ 8 (GLOVE) ×1 IMPLANT
GLOVE SUPERSENSE BIOGEL SZ 8 (GLOVE) ×1
GOWN PREVENTION PLUS XLARGE (GOWN DISPOSABLE) ×2 IMPLANT
GOWN STRL NON-REIN LRG LVL3 (GOWN DISPOSABLE) ×6 IMPLANT
GOWN STRL REIN XL XLG (GOWN DISPOSABLE) ×4 IMPLANT
JUGGERKNOT SHORT SET 1.4MM (KITS) IMPLANT
K-WIRE DBL TROCAR .045X4 ×10 IMPLANT
K-WIRE DBL TROCAR .062X4 ×6 IMPLANT
KIT BASIN OR (CUSTOM PROCEDURE TRAY) ×2 IMPLANT
KIT ROOM TURNOVER OR (KITS) ×2 IMPLANT
KWIRE DBL TROCAR .045X4 ×5 IMPLANT
KWIRE DBL TROCAR .062X4 ×3 IMPLANT
LOOP VESSEL MAXI BLUE (MISCELLANEOUS) IMPLANT
MANIFOLD NEPTUNE II (INSTRUMENTS) IMPLANT
NEEDLE 22X1 1/2 (OR ONLY) (NEEDLE) ×2 IMPLANT
NEEDLE HYPO 25GX1X1/2 BEV (NEEDLE) ×2 IMPLANT
NS IRRIG 1000ML POUR BTL (IV SOLUTION) ×2 IMPLANT
PACK ORTHO EXTREMITY (CUSTOM PROCEDURE TRAY) ×2 IMPLANT
PAD ARMBOARD 7.5X6 YLW CONV (MISCELLANEOUS) ×4 IMPLANT
PAD CAST 3X4 CTTN HI CHSV (CAST SUPPLIES) ×2 IMPLANT
PAD CAST 4YDX4 CTTN HI CHSV (CAST SUPPLIES) ×3 IMPLANT
PADDING CAST COTTON 3X4 STRL (CAST SUPPLIES) ×2
PADDING CAST COTTON 4X4 STRL (CAST SUPPLIES) ×3
SET JUGGERKNOT DISP 1.4MM ×2 IMPLANT
SPLINT FIBERGLASS 4X30 (CAST SUPPLIES) ×4 IMPLANT
SPONGE GAUZE 4X4 12PLY (GAUZE/BANDAGES/DRESSINGS) ×2 IMPLANT
SPONGE LAP 4X18 X RAY DECT (DISPOSABLE) ×2 IMPLANT
SUT FIBERWIRE 2-0 18 17.9 3/8 (SUTURE) ×2
SUT MNCRL AB 4-0 PS2 18 (SUTURE) ×2 IMPLANT
SUT PROLENE 3 0 PS 2 (SUTURE) IMPLANT
SUT PROLENE 4 0 P 3 18 (SUTURE) ×2 IMPLANT
SUT PROLENE 4 0 PS 2 18 (SUTURE) ×2 IMPLANT
SUT VIC AB 3-0 FS2 27 (SUTURE) IMPLANT
SUTURE FIBERWR 2-0 18 17.9 3/8 (SUTURE) ×1 IMPLANT
SYR CONTROL 10ML LL (SYRINGE) ×2 IMPLANT
SYSTEM CHEST DRAIN TLS 7FR (DRAIN) ×4 IMPLANT
TOWEL OR 17X24 6PK STRL BLUE (TOWEL DISPOSABLE) ×2 IMPLANT
TOWEL OR 17X26 10 PK STRL BLUE (TOWEL DISPOSABLE) ×2 IMPLANT
TUBE CONNECTING 12X1/4 (SUCTIONS) ×2 IMPLANT
TUBE EVACUATION TLS (MISCELLANEOUS) IMPLANT
WATER STERILE IRR 1000ML POUR (IV SOLUTION) IMPLANT

## 2012-12-15 NOTE — ED Notes (Signed)
Paged dr Amanda Pea, he is aware pt is in ed and will be coming to see the pt.

## 2012-12-15 NOTE — Anesthesia Procedure Notes (Signed)
Procedure Name: Intubation Date/Time: 12/15/2012 8:52 PM Performed by: Crit Obremski S Pre-anesthesia Checklist: Patient identified, Timeout performed, Emergency Drugs available, Suction available and Patient being monitored Patient Re-evaluated:Patient Re-evaluated prior to inductionOxygen Delivery Method: Circle system utilized Preoxygenation: Pre-oxygenation with 100% oxygen Intubation Type: IV induction, Rapid sequence and Cricoid Pressure applied Laryngoscope Size: Mac and 4 Grade View: Grade I Tube type: Oral Tube size: 7.5 mm Number of attempts: 1 Airway Equipment and Method: Stylet Placement Confirmation: ETT inserted through vocal cords under direct vision,  positive ETCO2 and breath sounds checked- equal and bilateral Secured at: 22 cm Tube secured with: Tape Dental Injury: Teeth and Oropharynx as per pre-operative assessment

## 2012-12-15 NOTE — Transfer of Care (Signed)
Immediate Anesthesia Transfer of Care Note  Patient: Donald Mayer  Procedure(s) Performed: Procedure(s): OPEN REDUCTION INTERNAL FIXATION (ORIF) WRIST FRACTURE; Left.  Reduction, repair, and reconnstruction of perilunate fracture dislocation (Left)  Patient Location: PACU  Anesthesia Type:General  Level of Consciousness: awake, alert  and oriented  Airway & Oxygen Therapy: Patient Spontanous Breathing and Patient connected to nasal cannula oxygen  Post-op Assessment: Report given to PACU RN and Post -op Vital signs reviewed and stable  Post vital signs: Reviewed and stable  Complications: No apparent anesthesia complications

## 2012-12-15 NOTE — Preoperative (Signed)
Beta Blockers   Reason not to administer Beta Blockers:Not Applicable 

## 2012-12-15 NOTE — ED Provider Notes (Signed)
History    CSN: [id number] Arrival date & time 12/15/12  1305  First MD Initiated Contact with Patient 12/15/12 1334     Chief Complaint  Patient presents with  . Wrist Pain   (Consider location/radiation/quality/duration/timing/severity/associated sxs/prior Treatment) Patient is a 27 y.o. male presenting with wrist pain. No language interpreter was used.  Wrist Pain This is a new problem. The current episode started today. The problem occurs constantly. The problem has been unchanged. Associated symptoms include myalgias. Nothing aggravates the symptoms. He has tried nothing for the symptoms.  Pt fell onto left hand and wrist.   Pt complains of swelling and pain. Past Medical History  Diagnosis Date  . Back injuries    History reviewed. No pertinent past surgical history. No family history on file. History  Substance Use Topics  . Smoking status: Former Games developer  . Smokeless tobacco: Not on file  . Alcohol Use: No     Comment: occasional    Review of Systems  Musculoskeletal: Positive for myalgias.  Skin: Negative for wound.  All other systems reviewed and are negative.    Allergies  Review of patient's allergies indicates no known allergies.  Home Medications   Current Outpatient Rx  Name  Route  Sig  Dispense  Refill  . acyclovir (ZOVIRAX) 200 MG capsule   Oral   Take by mouth 3 (three) times daily.         . promethazine (PHENERGAN) 25 MG tablet   Oral   Take 1 tablet (25 mg total) by mouth every 6 (six) hours as needed for nausea.   12 tablet   0    BP 156/89  Pulse 100  Temp(Src) 99.4 F (37.4 C) (Oral)  Resp 16  Ht 6\' 2"  (1.88 m)  Wt 315 lb 2 oz (142.94 kg)  BMI 40.44 kg/m2  SpO2 98% Physical Exam  Nursing note and vitals reviewed. Constitutional: He is oriented to person, place, and time. He appears well-developed and well-nourished.  Musculoskeletal: He exhibits tenderness.  Swollen tender left wrist  Pain with range of motion,  nv and ns  intact  Neurological: He is alert and oriented to person, place, and time. He has normal reflexes.  Skin: Skin is warm.  Psychiatric: He has a normal mood and affect.    ED Course  Procedures (including critical care time) Labs Reviewed - No data to display Dg Wrist Complete Left  12/15/2012   *RADIOLOGY REPORT*  Clinical Data: The wrist pain playing basketball.  LEFT WRIST - COMPLETE 3+ VIEW  Comparison: 12/15/2012  Findings: Four views study of the wrist shows evidence of perilunate dislocation.  Linear lucency associated with the proximal articular surface of the lunate raises the question of associated lunate fracture there is a tiny sliver of bone seen between the lunate and triquetrum which may be related to the associated avulsion fragment.  IMPRESSION: Imaging features consistent with perilunate dislocation.  Concern for associated lunate fracture and potentially avulsion fracture involving the triquetrum.  CT imaging could be used to further assess, as clinically warranted.  I personally called the results of the study to Rolan Bucco, in the emergency department, at 1350 hours on 12/15/2012.   Original Report Authenticated By: Kennith Center, M.D.   1. Wrist fracture, closed, left, closed, initial encounter     MDM  Call to Dr. Amanda Pea  He advised to have pt go to [facility] ED.   Nothing to eat or dring.    Pt will have  surgery.   Pt placed in a splint, ice,     Lonia Skinner [place], [place] 12/15/12 1433

## 2012-12-15 NOTE — ED Provider Notes (Signed)
Medical screening examination/treatment/procedure(s) were conducted as a shared visit with non-physician practitioner(s) and myself.  I personally evaluated the patient during the encounter   Rolan Bucco, MD 12/15/12 1437

## 2012-12-15 NOTE — ED Notes (Signed)
Pt sent here from [facility] for left wrist fx, was told he needed to have surgery. Pain reports severe pain to his wrist. Splint in place pta.

## 2012-12-15 NOTE — ED Notes (Signed)
Paged Dr. Amanda Pea to [phone]

## 2012-12-15 NOTE — Anesthesia Postprocedure Evaluation (Signed)
Anesthesia Post Note  Patient: Donald Mayer  Procedure(s) Performed: Procedure(s) (LRB): OPEN REDUCTION INTERNAL FIXATION (ORIF) WRIST FRACTURE; Left.  Reduction, repair, and reconnstruction of perilunate fracture dislocation (Left)  Anesthesia type: general  Patient location: PACU  Post pain: Pain level controlled  Post assessment: Patient's Cardiovascular Status Stable  Last Vitals:  Filed Vitals:   12/15/12 2345  BP: 165/98  Pulse: 87  Temp: 36.7 C  Resp: 17    Post vital signs: Reviewed and stable  Level of consciousness: sedated  Complications: No apparent anesthesia complications

## 2012-12-15 NOTE — ED Provider Notes (Signed)
MSE was initiated and I personally evaluated the patient and placed orders (if any) at  4:44 PM on December 15, 2012.  The patient appears stable so that the remainder of the MSE may be completed by another provider.  Transferred from [facility] to see hand surgery. Due to have surgery for a perilunate dislocation. Will give patient pain medicines. [employer]. Rubin Payor, MD 12/15/12 [phone]

## 2012-12-15 NOTE — Anesthesia Preprocedure Evaluation (Addendum)
Anesthesia Evaluation  Patient identified by MRN, date of birth, ID band Patient awake    Reviewed: Allergy & Precautions, H&P , NPO status , Patient's Chart, lab work & pertinent test results  History of Anesthesia Complications Negative for: history of anesthetic complications  Airway Mallampati: I TM Distance: >3 FB Neck ROM: Full    Dental  (+) Teeth Intact and Dental Advisory Given   Pulmonary Current Smoker,          Cardiovascular negative cardio ROS      Neuro/Psych negative neurological ROS     GI/Hepatic negative GI ROS, Neg liver ROS,   Endo/Other  Morbid obesity  Renal/GU negative Renal ROS     Musculoskeletal negative musculoskeletal ROS (+)   Abdominal   Peds  Hematology negative hematology ROS (+)   Anesthesia Other Findings   Reproductive/Obstetrics                         Anesthesia Physical Anesthesia Plan  ASA: II and emergent  Anesthesia Plan: General   Post-op Pain Management:    Induction: Intravenous, Rapid sequence and Cricoid pressure planned  Airway Management Planned: Oral ETT  Additional Equipment:   Intra-op Plan:   Post-operative Plan: Extubation in OR  Informed Consent: I have reviewed the patients History and Physical, chart, labs and discussed the procedure including the risks, benefits and alternatives for the proposed anesthesia with the patient or authorized representative who has indicated his/her understanding and acceptance.   Dental advisory given  Plan Discussed with: CRNA and Surgeon  Anesthesia Plan Comments:        Anesthesia Quick Evaluation

## 2012-12-15 NOTE — H&P (Signed)
Donald Mayer is an 27 y.o. male.   Chief Complaint: Fracture dislocation left wrist after a fall HPI: Patient presents with a perilunate fracture dislocation after a fall. He has numbness in the median nerve distribution. He notes pain with any movement. Seen [facility]. I reviewed his x-rays in the [facility] website and immediately asked him to be transferred to [facility]. I reviewed his examination at length and discussed with he and his family all issues. At present time I recommend open reduction, carpal tunnel release, internal/external fixation is necessary and repair reconstruction. We are trying to get to the operating room as soon as possible.  He denies neck back chest or abdominal pain.  Past Medical History  Diagnosis Date  . Back injuries     History reviewed. No pertinent past surgical history.  History reviewed. No pertinent family history. Social History:  reports that he has quit smoking. He does not have any smokeless tobacco history on file. He reports that he does not drink alcohol or use illicit drugs.  Allergies: No Known Allergies   (Not in a hospital admission)  Results for orders placed during the hospital encounter of 12/15/12 (from the past 48 hour(s))  CBC     Status: None   Collection Time    12/15/12  4:27 PM      Result Value Range   WBC 9.5  4.0 - 10.5 K/uL   RBC 4.92  4.22 - 5.81 MIL/uL   Hemoglobin 15.0  13.0 - 17.0 g/dL   HCT 09.8  11.9 - 14.7 %   MCV 85.4  78.0 - 100.0 fL   MCH 30.5  26.0 - 34.0 pg   MCHC 35.7  30.0 - 36.0 g/dL   RDW 82.9  56.2 - 13.0 %   Platelets 215  150 - 400 K/uL  BASIC METABOLIC PANEL     Status: Abnormal   Collection Time    12/15/12  4:27 PM      Result Value Range   Sodium 140  135 - 145 mEq/L   Potassium 4.7  3.5 - 5.1 mEq/L   Chloride 105  96 - 112 mEq/L   CO2 27  19 - 32 mEq/L   Glucose, Bld 108 (*) 70 - 99 mg/dL   BUN 13  6 - 23 mg/dL   Creatinine, Ser 8.65 (*) 0.50 - 1.35 mg/dL    Calcium 9.3  8.4 - 78.4 mg/dL   GFR calc non Af Amer 60 (*) >90 mL/min   GFR calc Af Amer 70 (*) >90 mL/min   Comment:            The eGFR has been calculated     using the CKD EPI equation.     This calculation has not been     validated in all clinical     situations.     eGFR's persistently     <90 mL/min signify     possible Chronic Kidney Disease.   Dg Wrist Complete Left  12/15/2012   *RADIOLOGY REPORT*  Clinical Data: The wrist pain playing basketball.  LEFT WRIST - COMPLETE 3+ VIEW  Comparison: 12/15/2012  Findings: Four views study of the wrist shows evidence of perilunate dislocation.  Linear lucency associated with the proximal articular surface of the lunate raises the question of associated lunate fracture there is a tiny sliver of bone seen between the lunate and triquetrum which may be related to the associated avulsion fragment.  IMPRESSION:  Imaging features consistent with perilunate dislocation.  Concern for associated lunate fracture and potentially avulsion fracture involving the triquetrum.  CT imaging could be used to further assess, as clinically warranted.  I personally called the results of the study to Rolan Bucco, in the emergency department, at 1350 hours on 12/15/2012.   Original Report Authenticated By: Kennith Center, M.D.   Dg Hand Complete Left  12/15/2012   *RADIOLOGY REPORT*  Clinical Data: Fall playing basketball.  Wrist pain.  LEFT HAND - COMPLETE 3+ VIEW  Comparison: None.  Findings: Three-view exam of the scan shows evidence of perilunate dislocation.  This was better evaluated on the wrist exam which was performed at the same time and reported separately.  No evidence for fracture or dislocation involving the bones of the hand.  IMPRESSION: Perilunate dislocation.  Please see the report for the wrist exam which was performed at the same time.   Original Report Authenticated By: Kennith Center, M.D.    Review of Systems  Constitutional: Negative.   HENT:  Negative.   Eyes: Negative.   Cardiovascular: Negative.   Gastrointestinal: Negative.   Genitourinary: Negative.   Skin: Negative.   Neurological: Negative.   Endo/Heme/Allergies: Negative.   Psychiatric/Behavioral: Negative.     Blood pressure 118/78, pulse 90, temperature 98.2 F (36.8 C), temperature source Oral, resp. rate 18, height 6\' 2"  (1.88 m), weight 142.883 kg (315 lb), SpO2 98.00%. Physical Exam patient has a left wrist with swelling deformity and numbness in the median nerve distribution. Ulnar nerve is intact. Elbow is nontender shoulder stable. Patient has a intact refill exam in terms of his vascular capabilities about the extremity. He notes no history of prior injury to the hand/wrist  ..The patient is alert and oriented in no acute distress the patient complains of pain in the affected upper extremity.  The patient is noted to have a normal HEENT exam.  Lung fields show equal chest expansion and no shortness of breath  abdomen exam is nontender without distention.  Lower extremity examination does not show any fracture dislocation or blood clot symptoms.  Pelvis is stable neck and back are stable and nontender  Assessment/Plan I would recommend open reduction, carpal tunnel release, repair reconstruction of ligamentous architecture and pinning of the associated midcarpal and radiocarpal joint is necessary. He understands this.  Marland Kitchen.We are planning surgery for your upper extremity. The risk and benefits of surgery include risk of bleeding infection anesthesia damage to normal structures and failure of the surgery to accomplish its intended goals of relieving symptoms and restoring function with this in mind we'll going to proceed. I have specifically discussed with the patient the pre-and postoperative regime and the does and don'ts and risk and benefits in great detail. Risk and benefits of surgery also include risk of dystrophy chronic nerve pain failure of the healing  process to go onto completion and other inherent risks of surgery The relavent the pathophysiology of the disease/injury process, as well as the alternatives for treatment and postoperative course of action has been discussed in great detail with the patient who desires to proceed.  We will do everything in our power to help you (the patient) restore function to the upper extremity. Is a pleasure to see this patient today.   Nyshawn Gowdy III,Dejia Ebron M 12/15/2012, 7:30 PM

## 2012-12-15 NOTE — ED Notes (Signed)
Donald Mayer while playing basketball and landed on his left arm. Pain in his wrist and hand.

## 2012-12-16 MED ORDER — CEPHALEXIN 500 MG PO CAPS: 500.0000 mg | ORAL_CAPSULE | Freq: Four times a day (QID) | ORAL | Status: DC

## 2012-12-16 MED ORDER — PROMETHAZINE HCL 25 MG RE SUPP: 12.5000 mg | Freq: Four times a day (QID) | RECTAL | Status: DC | PRN

## 2012-12-16 MED ORDER — DOCUSATE SODIUM 100 MG PO CAPS
100.0000 mg | ORAL_CAPSULE | Freq: Two times a day (BID) | ORAL | Status: DC
  Administered 2012-12-16 (×2): 100 mg via ORAL
  Filled 2012-12-16 (×2): qty 1

## 2012-12-16 MED ORDER — PROMETHAZINE HCL 12.5 MG RE SUPP: 25.0000 mg | Freq: Four times a day (QID) | RECTAL | Status: DC | PRN

## 2012-12-16 MED ORDER — FAMOTIDINE 20 MG PO TABS
20.0000 mg | ORAL_TABLET | Freq: Two times a day (BID) | ORAL | Status: DC | PRN
  Filled 2012-12-16: qty 1

## 2012-12-16 MED ORDER — OXYCODONE HCL 5 MG PO TABS: 5.0000 mg | ORAL_TABLET | ORAL | Status: DC | PRN

## 2012-12-16 MED ORDER — CEFAZOLIN SODIUM 1-5 GM-% IV SOLN
1.0000 g | Freq: Three times a day (TID) | INTRAVENOUS | Status: DC
  Administered 2012-12-16 (×2): 1 g via INTRAVENOUS
  Filled 2012-12-16 (×4): qty 50

## 2012-12-16 MED ORDER — ALPRAZOLAM 0.5 MG PO TABS
0.5000 mg | ORAL_TABLET | Freq: Four times a day (QID) | ORAL | Status: DC | PRN
  Administered 2012-12-16: 0.5 mg via ORAL
  Filled 2012-12-16: qty 1

## 2012-12-16 MED ORDER — METHOCARBAMOL 500 MG PO TABS: 500.0000 mg | ORAL_TABLET | Freq: Four times a day (QID) | ORAL | Status: DC | PRN

## 2012-12-16 MED ORDER — ONDANSETRON HCL 4 MG/2ML IJ SOLN: 4.0000 mg | Freq: Four times a day (QID) | INTRAMUSCULAR | Status: DC | PRN

## 2012-12-16 MED ORDER — ONDANSETRON HCL 4 MG PO TABS: 4.0000 mg | ORAL_TABLET | Freq: Four times a day (QID) | ORAL | Status: DC | PRN

## 2012-12-16 MED ORDER — LACTATED RINGERS IV SOLN
INTRAVENOUS | Status: DC
  Administered 2012-12-16: 02:00:00 via INTRAVENOUS

## 2012-12-16 MED ORDER — CEFAZOLIN SODIUM 1-5 GM-% IV SOLN
1.0000 g | INTRAVENOUS | Status: AC
  Administered 2012-12-16: 1 g via INTRAVENOUS
  Filled 2012-12-16: qty 50

## 2012-12-16 MED ORDER — VITAMIN C 500 MG PO TABS
1000.0000 mg | ORAL_TABLET | Freq: Every day | ORAL | Status: DC
  Administered 2012-12-16: 1000 mg via ORAL
  Filled 2012-12-16: qty 2

## 2012-12-16 MED ORDER — HYDROMORPHONE HCL PF 1 MG/ML IJ SOLN
0.5000 mg | INTRAMUSCULAR | Status: DC | PRN
  Administered 2012-12-16 (×3): 1 mg via INTRAVENOUS
  Filled 2012-12-16 (×3): qty 1

## 2012-12-16 NOTE — Op Note (Signed)
NAMEMASSON, NALEPA           ACCOUNT NO.:  [id number]  MEDICAL RECORD NO.:  [id number]  LOCATION:  5N08C                        FACILITY:  [facility]  PHYSICIAN:  Dionne Ano. Jullianna Gabor, M.D.DATE OF BIRTH:  February 06, 1986  DATE OF PROCEDURE:  12/15/2012 DATE OF DISCHARGE:                              OPERATIVE REPORT   PREOPERATIVE DIAGNOSES:  Left wrist perilunate fracture dislocation with median nerve contusion/acute carpal tunnel syndrome.  POSTOPERATIVE DIAGNOSES:  Left wrist perilunate fracture dislocation with median nerve contusion/acute carpal tunnel syndrome.  PROCEDURES: 1. Open reduction perilunate fracture dislocation, left wrist. 2. EPL decompression and anterior transposition, left wrist. 3. Internal and external fixation with Kirschner wire radiocarpal and     midcarpal joint, left wrist. 4. Scapholunate interosseous ligament reconstruction with capsulodesis     and capsulorrhaphy (interosseous ligament reconstruction,) left     wrist. 5. Stress radiography, left wrist. 6. Posterior interosseous nerve neurectomy, left wrist. 7. Left open carpal tunnel release.  SURGEON:  Dionne Ano. Amanda Pea, M.D.  ASSISTANT:  None.  COMPLICATIONS:  None.  ANESTHESIA:  General.  TOURNIQUET TIME:  Less than 90 minutes.  DRAINS:  One in the carpal tunnel release site, one in the dorsal wrist wound.  ESTIMATED BLOOD LOSS:  Minimal.  DESCRIPTION OF PROCEDURE:  The patient is a 27 year old male who fell sustaining a perilunate fracture dislocation.  This was a very severe injury.  He complains of obvious pain and deformity and has numbness and tingling in the median nerve distribution.  I have counseled him and his family in regard to risks and benefits of surgery and they desired to proceed.  All questions have been encouraged and answered preoperatively.  OPERATIVE PROCEDURE:  The patient was seen by myself and anesthesia, taken to the operative suite, underwent smooth induction  of general anesthetic, laid supine and fully padded, prepped and draped in a sterile fashion, Betadine scrub paint about the left upper extremity. Once this done, final time-out was called.  Pre and postop check list complete and the patient underwent an incision dorsally about the wrist. Incision was carried down to the area of the extensor retinaculum.  The patient tolerated this well.  The retinaculum was identified.  Following this, it was Z lengthen, opened and next the EPL tendon (extensor pollicis longus tendon),  underwent a decompression and transposition to the soft tissues.  Following this, we then identified the posterior interosseous nerve. This underwent a crushing cauterization technique with posterior interosseous nerve neurectomy.  Following this, interval between the fourth and second dorsal compartments was created.  I opened the capsule so as to create a capsular sleeve for the capsulodesis.  This was a reverse Blatt capsulodesis, which was pre planned.  I then had the joint reduced, the perilunate fracture dislocation, and then placed joysticks in the scaphoid and the lunate.  I then reduced this, irrigated, cleaned at the midcarpal and radiocarpal joints and then placed 2.062 K-wires from radial to ulna engaging the scaphoid and lunate.  This was done under direct vision with the reduction held.  Following this, the lunotriquetral joint was reduced and pinned with 2.045 K-wires.  In addition to this, I placed a scaphoid capitate pin to hold the midcarpal  joint still.  Total of 5 K-wires were placed.  These were placed in anatomic position to recreate the normal anatomy.  AP, lateral, and oblique x-rays were taken to ensure correct position and all looked well.  Following this, I irrigated copiously.  I then placed 2 JuggerKnot, one in the area just off of Lister's tubercle and one in the lunate.  I then repaired the scapholunate ligament.  This was a direct  repair of the scapholunate ligament without difficulty.  Following this, I imbricated this.  Once this was complete, I then performed reverse capsulodesis. This was done without difficulty utilizing the drug are not in the area just off of Lister's.  These were tied down nicely and there were no complicating features.  I was pleased with this.  We then performed capsular closure.  Following this, portion of the retinaculum was placed over the dorsal wrist capsule to provide a buffer between the wrist and the tendons.  Following this, we irrigated copiously again with tourniquet deflated and ultimately closed the wound with Prolene over TLS drain.  Stress radiography looked excellent.  The internal and external fixation apparatus looked excellent.  Pins were all clipped below the skin surface and I will remove these at a later date of course.  Following this, attention was turned towards the carpal tunnel.  A 1- inch incision was made, dissecting the carpal ligament coursing proximally.  Following this, palmar fascia was incised distally as the transcarpal ligament was identified, released, and this was confirmed. I very carefully protected the median nerve and the superficial palmar arch.  I then dissected in a distal proximal direction until I could slide the Jarit scissors.  The patient tolerated this well.  The area was copiously irrigated.  The median nerve was intact, hyperemic, and contused.  Following this, I deflated the tourniquet, which was set and secured hemostasis.  Small drain was placed in carpal tunnel release site followed by closed of the wound with 4-0 Prolene.  I should note that the dorsal wound was closed over TLS drain as well. I placed Adaptic Xeroform gauze, Webril, and a thumb spica splint on the area and the patient tolerated this well.  There were no complicating features.  The patient had a sugar-tong thumb spica splint applied.  There were  no complications.  Compartments were soft and all looked well.  The patient good refill, good radial pulse.  He will be admitted for IV antibiotics, general postop observation.  I have discussed with he and his family. Our goals are 50% of active wrist motion, no pain and hopefully a stable ligamentous reconstruction that will allow the prevention of arthritis in the future.  I have discussed him these are horrible injuries and often times there is permanent disability including loss of motion, as well as propensity towards arthritis in the future.  Nevertheless, we were able to get a good repair, solid, and sound.  He will be monitored closely.  We will see him back in 2 weeks.  Sutures to be removed.  Steri-Strips applied.  Sugar-tong cast, thumb spike in nature.  We are going to keep him completely still for 6 weeks.  Finger to short-arm and I consider pin removal between 7-8 weeks roughly depending on how he is looking. We may perform this earlier depending on many issues, but typically like to keep the pins in for up to 8 weeks.  Certainly, this patient had devastating injury.  After the pins removed, we will begin  motion around 10-12 weeks.  No strengthening before 14-16 weeks.  I have discussed with the patient all issues do's and don'ts etc.  He will be admitted overnight.  We went over all findings with he and his family, and questions were encouraged and answered.     Dionne Ano. Amanda Pea, M.D.     [facility]  D:  12/16/2012  T:  12/16/2012  Job:  [id number]

## 2012-12-16 NOTE — Progress Notes (Signed)
UR completed 

## 2012-12-16 NOTE — Progress Notes (Signed)
Pt discharged to home accompanied by family. Discharge instructions and rx given and explained and pt stated understanding. Pts IV was removed prior to discharge. Pt left unit in a stable condition via wheelchair.  

## 2012-12-16 NOTE — Op Note (Signed)
See Dictation# [id number] Dominica Severin MD

## 2012-12-16 NOTE — Evaluation (Addendum)
Occupational Therapy Evaluation Patient Details Name: Donald Mayer MRN: [id number] DOB: 01-20-1986 Today's Date: 12/16/2012 Time: 8295-6213 OT Time Calculation (min): 35 min  OT Assessment / Plan / Recommendation History of present illness 27 y.o. s/p ORIF left wrist fracture; Left.  Reduction, repair, and reconnstruction of perilunate fracture dislocation (Left)   Clinical Impression   Pt s/p ORIF left wrist fracture, reduction, repair, and reconstruction of perilunate fracture dislocation. Pt did well during session. OT reviewed importance of elevating LUE and moving digits to decrease edema. Applied ice at end of session. Pt planning to d/c home with family/friends to assist intermittently.     OT Assessment  Patient does not need any further OT services    Follow Up Recommendations  No OT follow up;Supervision - Intermittent    Barriers to Discharge      Equipment Recommendations  None recommended by OT    Recommendations for Other Services    Frequency       Precautions / Restrictions Precautions Precaution Comments: Elevation, Edema Control, ADLs Restrictions Weight Bearing Restrictions: Yes LUE Weight Bearing: Non weight bearing   Pertinent Vitals/Pain Pain 9/10 after activity at end of session. Iced, elevated.     ADL  Eating/Feeding: Independent Where Assessed - Eating/Feeding: Edge of bed Grooming: Wash/dry hands;Modified independent Where Assessed - Grooming: Unsupported standing Upper Body Dressing: Performed;Set up;Supervision/safety Where Assessed - Upper Body Dressing: Unsupported sitting Lower Body Dressing: Supervision/safety;Set up Where Assessed - Lower Body Dressing: Unsupported sitting Toilet Transfer: Modified independent Toilet Transfer Method: Sit to stand Toilet Transfer Equipment: Regular height toilet Toileting - Clothing Manipulation and Hygiene: Modified independent Where Assessed - Toileting Clothing Manipulation and Hygiene:  Standing (stood to urinate) Tub/Shower Transfer: Landscape architect Method: Science writer: Other (comment) (practiced stepping over) Equipment Used: Gait belt Transfers/Ambulation Related to ADLs: Supervision level for ambulation due to pt reporting dizziness. Mod I for transfers. Pt did slightly lose balance on 1 occassion but self corrected. ADL Comments: Pt donned socks while sitting on bed. Pt able to don shirt with supervision/setup. Practiced simualted shower transfer and pt at supervision level. Educated pt to use shower chair at home and also to have someone with him when he gets in/out of tub. Encouraged pt to massage fingers to decrease edema and also Ice and elevate LUE. Cues to not put weight on arm during session. Pt c/o dizziness at times during session. BP was 130/89 sitting EOB.    OT Diagnosis:    OT Problem List:   OT Treatment Interventions:     OT Goals(Current goals can be found in the care plan section)    Visit Information  Last OT Received On: 12/16/12 Assistance Needed: +1 History of Present Illness: 27 y.o. s/p ORIF left wrist fracture; Left.  Reduction, repair, and reconnstruction of perilunate fracture dislocation (Left)       Prior Functioning     Home Living Family/patient expects to be discharged to:: Private residence Living Arrangements: Parent Available Help at Discharge: Family;Friend(s);Available PRN/intermittently Type of Home: Apartment Home Access: Stairs to enter Entrance Stairs-Number of Steps: 3 Entrance Stairs-Rails: None Home Layout: One level Home Equipment: None Prior Function Level of Independence: Independent Communication Communication: No difficulties Dominant Hand: Left         Vision/Perception     Cognition  Cognition Arousal/Alertness: Awake/alert Behavior During Therapy: WFL for tasks assessed/performed Overall Cognitive Status: Within Functional Limits  for tasks assessed    Extremity/Trunk Assessment Upper Extremity Assessment Upper Extremity  Assessment: LUE deficits/detail LUE Deficits / Details: Unable to fully assess due to ORIF of left wrist. Left.  Reduction, repair, and reconnstruction of perilunate fracture dislocation (Left)     Mobility Bed Mobility Bed Mobility: Supine to Sit;Sit to Supine Supine to Sit: 5: Supervision;6: Modified independent (Device/Increase time) Sit to Supine: 5: Supervision;6: Modified independent (Device/Increase time) Details for Bed Mobility Assistance: Initially supervision/cues to maintain NWB status on LUE. Transfers Transfers: Sit to Stand;Stand to Sit Sit to Stand: From toilet;From bed;6: Modified independent (Device/Increase time);5: Supervision Stand to Sit: 6: Modified independent (Device/Increase time);To toilet;To bed;5: Supervision Details for Transfer Assistance: Initally supervision from bed due to dizziness.      Exercise     Balance     End of Session OT - End of Session Equipment Utilized During Treatment: Gait belt Activity Tolerance: Patient tolerated treatment well Patient left: in bed;with call bell/phone within reach Nurse Communication: Mobility status  GO Functional Assessment Tool Used: clinical judgment Functional Limitation: Self care Self Care Current Status (Z6109): At least 1 percent but less than 20 percent impaired, limited or restricted Self Care Goal Status (U0454): At least 1 percent but less than 20 percent impaired, limited or restricted Self Care Discharge Status [phone]): At least 1 percent but less than 20 percent impaired, limited or restricted   Earlie Raveling OTR/L [phone] 12/16/2012, 4:04 PM

## 2012-12-16 NOTE — Progress Notes (Signed)
Subjective:2 1 Day Post-Op Procedure(s) (LRB): OPEN REDUCTION INTERNAL FIXATION (ORIF) WRIST FRACTURE; Left.  Reduction, repair, and reconnstruction of perilunate fracture dislocation (Left) Patient reports pain as moderate.  Patient is tolerating his diet. Patient notes no new complaints. He is alert and oriented.  He is urinating without difficulty. He is been up and out of bed. His arm is in a dependent position. I elevated it and discussed with him range of motion and other issues which are very important to his recovery. We spent greater than 20-30 minutes discussing elevation move and massage fingers  He denies neck back chest or nominal pain. His HEENT is within normal limits.  Objective: Vital signs in last 24 hours: Temp:  [98 F (36.7 C)-99.4 F (37.4 C)] 98.3 F (36.8 C) (07/04 0526) Pulse Rate:  [84-103] 103 (07/04 0526) Resp:  [15-22] 18 (07/04 0526) BP: (118-189)/(68-109) 149/93 mmHg (07/04 0526) SpO2:  [96 %-99 %] 97 % (07/04 0526) Weight:  [142.883 kg (315 lb)-142.94 kg (315 lb 2 oz)] 142.883 kg (315 lb) (07/03 1540)  Intake/Output from previous day: 07/03 0701 - 07/04 0700 In: 2067.5 [I.V.:2017.5; IV Piggyback:50] Out: -  Intake/Output this shift:     Recent Labs  12/15/12 1627  HGB 15.0    Recent Labs  12/15/12 1627  WBC 9.5  RBC 4.92  HCT 42.0  PLT 215    Recent Labs  12/15/12 1627  NA 140  K 4.7  CL 105  CO2 27  BUN 13  CREATININE 1.57*  GLUCOSE 108*  CALCIUM 9.3   No results found for this basename: LABPT, INR,  in the last 72 hours Physical exam: Patient still has some slight numbness in the median nerve distribution due to the neuropraxia to the median nerve. We performed carpal tunnel release. His ulnar nerve is sensate. He can tell which finger sign touching but the median nerve feels somewhat less in terms of acuity as opposed the ulnar nerve which feels normal. This is consistent with his injury and the contusive injury to the median  nerve. We performed a carpal tunnel release at the time of surgery. He can move his fingers well thumb index middle ring and small finger have good flexion and extension after I worked with him at bedside. He has no evidence of compartment syndrome. He has no evidence of dystrophy.  HEENT stay normal limits. Neck and back are stable. Lower stem examination is benign. I discussed and the need for mobilization. I spent a long time with him at bedside going over that pertinent issues postop. ABD soft Intact pulses distally No cellulitis present Compartment soft  Assessment/Plan: 1 Day Post-Op Procedure(s) (LRB): OPEN REDUCTION INTERNAL FIXATION (ORIF) WRIST FRACTURE; Left.  Reduction, repair, and reconnstruction of perilunate fracture dislocation (Left) with carpal tunnel release and stabilization of the dislocation.  We will plan for IV antibiotics. We will plan tentatively for discharge this afternoon after the antibiotics. I discussed him the importance of elevation range of motion and other measures. Should any problems arise from notify us. At present juncture he is quite stable. We will have to wait and see how he does and to the future. I discussed with him the median nerve contusion issues as well as the expected loss of motion and a high propensity towards arthritis with this devastating injury to the wrist.  He'll be discharged home on Keflex, Phenergan, oxycodone, Robaxin.  He'll notify me should any problems occur. He will return to see [place] in our office  in 12-14 days and I discussed with him the followup care plan.  His mother and grandmother understand the plan of action and the necessary postop measures as does the patient.    Karen Chafe 12/16/2012, 9:40 AM

## 2012-12-16 NOTE — Discharge Summary (Signed)
  Please see postop day 1 progress note. There is some abnormal sensation in the median nerve distribution do to the contusive injury with his predicament.Marland KitchenMarland KitchenPatient has been seen and examined. Patient has pain appropriate to his injury/process. Patient denies new complaints at this present time. I have discussed his care with nursing staff. Patient is appropriate and alert.  We reviewed vital signs and intake output which are stable.  The upper extremity is neurovascularly intact. Refill is normal. There is no signs of compartment syndrome. There is no signs of dystrophy.    We have discussed techniques to decrease edema and promote flexion extension of the fingers. Patient understands the importance of elevation range of motion massage and other measures to lessen pain and prevent swelling.  We have discussed with the patient shoulder range of motion to prevent adhesive capsulitis.  The remainder of the examination is normal today without complicating feature.  Drain was removed without difficulty  Patient will be discharged home. Will plan to see the patient back in the off as per discharge instructions (please see discharge instructions).  Patient had an uneventful hospital course. At the time of discharge patient is stable awake alert and oriented in no acute distress. Regular diet will be continued and has been tolerated. Patient will notify should have problems occur. There is no signs of DVT infection or other complication at this juncture.  All questions have been incurred and answered.  Demar Shad MD

## 2012-12-16 NOTE — Progress Notes (Signed)
Orthopedic Tech Progress Note Patient Details:  Donald Mayer 01/26/1986 [id number]  Ortho Devices Type of Ortho Device: Arm sling Ortho Device/Splint Interventions: Application   Cammer, Mickie Bail 12/16/2012, 3:51 PM

## 2012-12-19 ENCOUNTER — Encounter (HOSPITAL_COMMUNITY): Payer: Self-pay | Admitting: Orthopedic Surgery

## 2013-06-22 ENCOUNTER — Encounter (HOSPITAL_BASED_OUTPATIENT_CLINIC_OR_DEPARTMENT_OTHER): Payer: Self-pay | Admitting: Emergency Medicine

## 2013-06-22 ENCOUNTER — Emergency Department (HOSPITAL_BASED_OUTPATIENT_CLINIC_OR_DEPARTMENT_OTHER)
Admission: EM | Admit: 2013-06-22 | Discharge: 2013-06-22 | Disposition: A | Discharge status: Home / Self Care | Payer: BC Managed Care – PPO | Attending: Emergency Medicine | Admitting: Emergency Medicine

## 2013-06-22 DIAGNOSIS — R1110 Vomiting, unspecified: Secondary | ICD-10-CM | POA: Insufficient documentation

## 2013-06-22 DIAGNOSIS — J111 Influenza due to unidentified influenza virus with other respiratory manifestations: Secondary | ICD-10-CM | POA: Insufficient documentation

## 2013-06-22 DIAGNOSIS — Z87891 Personal history of nicotine dependence: Secondary | ICD-10-CM | POA: Insufficient documentation

## 2013-06-22 DIAGNOSIS — R69 Illness, unspecified: Secondary | ICD-10-CM

## 2013-06-22 DIAGNOSIS — Z792 Long term (current) use of antibiotics: Secondary | ICD-10-CM | POA: Insufficient documentation

## 2013-06-22 DIAGNOSIS — Z87828 Personal history of other (healed) physical injury and trauma: Secondary | ICD-10-CM | POA: Insufficient documentation

## 2013-06-22 DIAGNOSIS — R197 Diarrhea, unspecified: Secondary | ICD-10-CM | POA: Insufficient documentation

## 2013-06-22 MED ORDER — OSELTAMIVIR PHOSPHATE 75 MG PO CAPS: 75.0000 mg | ORAL_CAPSULE | Freq: Two times a day (BID) | ORAL | Status: DC

## 2013-06-22 NOTE — Discharge Instructions (Signed)
Return to the ED with any concerns including difficulty breathing, vomiting and not able to keep down liquids, decreased urine output, decreased level of alertness/lethargy, or any other alarming symptoms  °

## 2013-06-22 NOTE — ED Provider Notes (Signed)
CSN: [id number]     Arrival date & time 06/22/13  1443 History   First MD Initiated Contact with Patient 06/22/13 1500     Chief Complaint  Patient presents with  . Influenza   (Consider location/radiation/quality/duration/timing/severity/associated sxs/prior Treatment) HPI Pt presenting with c/o diffuse body aches, vomiting, diarrhea, nasal congestion and mild cough.  Symptoms began yesterday.  No further vomiting today, continues to have loose bowel movements but is able to keep down liquids.  No difficulty breathing.  Had fever yesterday of 101.  No flu shot this season.  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  Has been taking ibuprofen, theraflu and mucinex with some relief.   Immunizations are up to date.  No recent travel. There are no other associated systemic symptoms, there are no other alleviating or modifying factors.   Past Medical History  Diagnosis Date  . Back injuries    Past Surgical History  Procedure Laterality Date  . Orif wrist fracture Left 12/15/2012    Procedure: OPEN REDUCTION INTERNAL FIXATION (ORIF) WRIST FRACTURE; Left.  Reduction, repair, and reconnstruction of perilunate fracture dislocation;  Surgeon: Roseanne Kaufman, MD;  Location: [place];  Service: Orthopedics;  Laterality: Left;   History reviewed. No pertinent family history. History  Substance Use Topics  . Smoking status: Former Research scientist (life sciences)  . Smokeless tobacco: Not on file  . Alcohol Use: No     Comment: occasional    Review of Systems ROS reviewed and all otherwise negative except for mentioned in HPI  Allergies  Review of patient's allergies indicates no known allergies.  Home Medications   Current Outpatient Rx  Name  Route  Sig  Dispense  Refill  . cephALEXin (KEFLEX) 500 MG capsule   Oral   Take 1 capsule (500 mg total) by mouth 4 (four) times daily.   40 capsule   0   . methocarbamol (ROBAXIN) 500 MG tablet   Oral   Take 1 tablet (500 mg total) by mouth  every 6 (six) hours as needed.   60 tablet   0   . oseltamivir (TAMIFLU) 75 MG capsule   Oral   Take 1 capsule (75 mg total) by mouth every 12 (twelve) hours.   10 capsule   0   . oxyCODONE (OXY IR/ROXICODONE) 5 MG immediate release tablet   Oral   Take 1-2 tablets (5-10 mg total) by mouth every 4 (four) hours as needed.   60 tablet   0   . promethazine (PHENERGAN) 12.5 MG suppository   Rectal   Place 2 suppositories (25 mg total) rectally every 6 (six) hours as needed.   12 each   0    BP 144/77  Pulse 93  Temp(Src) 98.7 F (37.1 C) (Oral)  Resp 18  Ht 6\' 2"  (1.88 m)  Wt 308 lb 4 oz (139.821 kg)  BMI 39.56 kg/m2  SpO2 98% Vitals reviewed Physical Exam Physical Examination: General appearance - alert, well appearing, and in no distress Mental status - alert, oriented to person, place, and time Eyes - no conjunctival injection, no scleral icterus Mouth - mucous membranes moist, pharynx normal without lesions Neck - supple, no significant adenopathy Chest - clear to auscultation, no wheezes, rales or rhonchi, symmetric air entry Heart - normal rate, regular rhythm, normal S1, S2, no murmurs, rubs, clicks or gallops Abdomen - soft, nontender, nondistended, no masses or organomegaly, nabs Extremities - peripheral pulses normal, no pedal edema, no clubbing or cyanosis  Skin - normal coloration and turgor, no rashes  ED Course  Procedures (including critical care time) Labs Review Labs Reviewed - No data to display Imaging Review No results found.  EKG Interpretation   None       MDM   1. Influenza-like illness    Pt presenting with c/o body aches, vomiting and diarrhea, fever.  Pt appears well hydrated and nontoxic.  Abdominal exam is benign.  Will start on tamiflu for flu like illness.  Discharged with strict return precautions.  Pt agreeable with plan.    Threasa Beards, MD 06/22/13 [phone]

## 2013-06-22 NOTE — ED Notes (Signed)
Pt c/o n/v/d and body aches x 1 day

## 2013-08-06 ENCOUNTER — Encounter (HOSPITAL_BASED_OUTPATIENT_CLINIC_OR_DEPARTMENT_OTHER): Payer: Self-pay | Admitting: Emergency Medicine

## 2013-08-06 ENCOUNTER — Emergency Department (HOSPITAL_BASED_OUTPATIENT_CLINIC_OR_DEPARTMENT_OTHER)
Admission: EM | Admit: 2013-08-06 | Discharge: 2013-08-06 | Disposition: A | Discharge status: Home / Self Care | Payer: BC Managed Care – PPO | Attending: Emergency Medicine | Admitting: Emergency Medicine

## 2013-08-06 ENCOUNTER — Emergency Department (HOSPITAL_BASED_OUTPATIENT_CLINIC_OR_DEPARTMENT_OTHER): Payer: BC Managed Care – PPO

## 2013-08-06 DIAGNOSIS — S6992XA Unspecified injury of left wrist, hand and finger(s), initial encounter: Secondary | ICD-10-CM

## 2013-08-06 DIAGNOSIS — M8708 Idiopathic aseptic necrosis of bone, other site: Secondary | ICD-10-CM | POA: Insufficient documentation

## 2013-08-06 DIAGNOSIS — S59909A Unspecified injury of unspecified elbow, initial encounter: Secondary | ICD-10-CM | POA: Insufficient documentation

## 2013-08-06 DIAGNOSIS — Y939 Activity, unspecified: Secondary | ICD-10-CM | POA: Insufficient documentation

## 2013-08-06 DIAGNOSIS — Z792 Long term (current) use of antibiotics: Secondary | ICD-10-CM | POA: Insufficient documentation

## 2013-08-06 DIAGNOSIS — F17200 Nicotine dependence, unspecified, uncomplicated: Secondary | ICD-10-CM | POA: Insufficient documentation

## 2013-08-06 DIAGNOSIS — W010XXA Fall on same level from slipping, tripping and stumbling without subsequent striking against object, initial encounter: Secondary | ICD-10-CM | POA: Insufficient documentation

## 2013-08-06 DIAGNOSIS — S59919A Unspecified injury of unspecified forearm, initial encounter: Principal | ICD-10-CM

## 2013-08-06 DIAGNOSIS — Y929 Unspecified place or not applicable: Secondary | ICD-10-CM | POA: Insufficient documentation

## 2013-08-06 DIAGNOSIS — M87039 Idiopathic aseptic necrosis of unspecified carpus: Secondary | ICD-10-CM

## 2013-08-06 DIAGNOSIS — S6990XA Unspecified injury of unspecified wrist, hand and finger(s), initial encounter: Principal | ICD-10-CM | POA: Insufficient documentation

## 2013-08-06 NOTE — Discharge Instructions (Signed)
Call Dr. Vanetta Shawl office tomorrow for further evaluation as it appears you may have avascular necrosis of a bone in your wrist.     Avascular Necrosis Avascular necrosis is a disease resulting from the temporary or permanent loss of the blood supply to the bones. Without blood, the bone tissue dies and causes the bone to become soft. If the process involves the bone near a joint, it may lead to collapse of the joint surface. This disease is also known as:  Osteonecrosis.  Aseptic necrosis.  Ischemic bone necrosis. Avascular necrosis most commonly affects the ends (epiphysis) of long bones. The femur, the bone extending from the knee joint to the hip joint, is the bone most commonly involved. The disease may affect 1 bone, more than 1 bone at the same time, more than 1 bone at different times. It affects men and women equally. Avascular necrosis occurs at any age. But it is more common between the ages of 25 and 17 years. SYMPTOMS  In early stages patients may not have any symptoms. But as the disease progresses, joint pain generally develops. At first there is pain when putting weight on the affected joint, and then when resting. Pain usually develops gradually. It may be mild or severe. As the disease progresses and the bone and surrounding joint surface collapses, pain may develop or increase dramatically. Pain may be severe enough to limit range of motion in the affected joint. The period of time between the first symptoms and loss of joint function is different for each patient. This can range from several months to more than a year. Disability depends on:  What part of the bone is affected.  How large an area is involved.  How effectively the bone repairs itself.  If other illnesses are present.  If you are being treated for cancer with medications (chemotherapy).  Radiation.  The cause of the avascular necrosis. DIAGNOSIS  The diagnosis of aseptic necrosis is usually made  by:  Taking a history.  Doing an exam.  Taking X-rays. (If X-rays are normal, an MRI may be required.)  Sometimes further blood work and specialized studies may be necessary. TREATMENT  Treatment for this disease is necessary to maintain joint function. If untreated, most patients will suffer severe pain and limitation in movement within 2 years. Several treatments are available that help prevent further bone and joint damage. They can also reduce pain. To determine the most appropriate treatment, the caregiver considers the following aspects of a patient's disease:  The age of the patient.  The stage of the disease (early or late).  The location and amount of bone affected. It may be a small or large area.  The underlying cause of avascular necrosis. The goals in treatment are to:  Improve the patient's use of the affected joint.  Stop further damage to the bone.  Improve bone and joint survival. Your caregiver may use one or more of the following treatments:  Reduced weight bearing. If avascular necrosis is diagnosed early, the caregiver may begin treatment by having the patient limit weight on the affected joint. The caregiver may recommend limiting activities or using crutches. In some cases, reduced weight bearing can slow the damage caused by the disease and permit natural healing. When combined with medication to reduce pain, reduced weight bearing can be an effective way to avoid or delay surgery for some patients. Most patients eventually will need surgery to reconstruct the joint.  Core decompression. Core decompression works best in  people who are in the earliest stages of avascular necrosis, before the collapse of the joint. This procedure often can reduce pain and slow the progression of bone and joint destruction in these patients. This surgical procedure removes the inner layer of bone, which:  Reduces pressure within the bone.  Increases blood flow to the  bone.  Allows more blood vessels to form.  Reduces pain.  Osteotomy. This surgical procedure re-shapes the bone to reduce stress on the affected area of the joint. There is a lengthy recovery period. The patient's activities are very limited for 3 to 12 months after an osteotomy. This procedure is most effective for younger patients with advanced avascular necrosis, and those with a large area of affected bone.  Bone Graft. A bone graft may be used to support a joint after core decompression. Bone grafting is surgery that transplants healthy bone from one part of the patient, such as the leg, to the diseased area. Sometimes the bone is taken with it's blood vessels which are attached to local blood vessels near the area of bone collapse. This is called a vascularized bone graft. There is a lengthy recovery period after a bone graft, usually from 6 to 12 months. This procedure is technically complex.  Arthroplasty. Arthroplasty is also known as total joint replacement. Total joint replacement is used in late-stage avascular necrosis, and when the joint is deformed. In this surgery, the diseased joint is replaced with artificial parts. It may be recommended for people who are not good candidates for other treatments, such as patients who may not do well with repeated attempts to preserve the joint. Various types of replacements are available, and patients should discuss specific needs with their caregiver. New treatments being tried include:  The use of medications.  Electrical stimulation.  Combination therapies to increase the growth of new bone and blood vessels. Document Released: 11/21/2001 Document Revised: 08/24/2011 Document Reviewed: 01/22/2009 [facility] Patient Information 2014 [place].

## 2013-08-06 NOTE — ED Provider Notes (Signed)
CSN: [id number]     Arrival date & time 08/06/13  1456 History  This chart was scribed for Shaune Pollack, MD by Vernell Barrier, ED scribe. This patient was seen in room MH10/MH10 and the patient's care was started at 3:25 PM.    Chief Complaint  Patient presents with  . Hand Injury   Patient is a 28 y.o. male presenting with hand injury. The history is provided by the patient. No language interpreter was used.  Hand Injury Location:  Wrist Injury: yes   Mechanism of injury: fall   Fall:    Fall occurred:  Tripped   Impact surface:  Hard floor   Point of impact:  Outstretched arms Wrist location:  L wrist Pain details:    Radiates to:  Does not radiate   Severity:  Moderate   Onset quality:  Gradual   Timing:  Constant   Progression:  Worsening Handedness:  Left-handed Prior injury to area:  Yes Relieved by:  Ice Worsened by:  Movement HPI Comments: Donald Mayer is a 28 y.o. male w/hx of left wrist fracture presents to the Emergency Department complaining of constant left wrist pain with associated swelling following a fall occuring this morning; pt states caught himself with his left hand. States he took ibuprofen and iced the site of injury earlier with mild relief. Pt had surgery July and September 2014 for a left wrist fracture. Pt works for [employer] and [employer]. Denies any allergies or regular medications. Pt is left handed. Denies numbness or tingling.  Past Medical History  Diagnosis Date  . Back injuries    Past Surgical History  Procedure Laterality Date  . Orif wrist fracture Left 12/15/2012    Procedure: OPEN REDUCTION INTERNAL FIXATION (ORIF) WRIST FRACTURE; Left.  Reduction, repair, and reconnstruction of perilunate fracture dislocation;  Surgeon: Roseanne Kaufman, MD;  Location: [facility];  Service: Orthopedics;  Laterality: Left;   No family history on file. History  Substance Use Topics  . Smoking status: Current Every Day Smoker  .  Smokeless tobacco: Not on file  . Alcohol Use: No     Comment: occasional    Review of Systems  Musculoskeletal: Positive for joint swelling.       L wrist pain  All other systems reviewed and are negative.   Allergies  Review of patient's allergies indicates no known allergies.  Home Medications   Current Outpatient Rx  Name  Route  Sig  Dispense  Refill  . cephALEXin (KEFLEX) 500 MG capsule   Oral   Take 1 capsule (500 mg total) by mouth 4 (four) times daily.   40 capsule   0   . methocarbamol (ROBAXIN) 500 MG tablet   Oral   Take 1 tablet (500 mg total) by mouth every 6 (six) hours as needed.   60 tablet   0   . oseltamivir (TAMIFLU) 75 MG capsule   Oral   Take 1 capsule (75 mg total) by mouth every 12 (twelve) hours.   10 capsule   0   . oxyCODONE (OXY IR/ROXICODONE) 5 MG immediate release tablet   Oral   Take 1-2 tablets (5-10 mg total) by mouth every 4 (four) hours as needed.   60 tablet   0   . promethazine (PHENERGAN) 12.5 MG suppository   Rectal   Place 2 suppositories (25 mg total) rectally every 6 (six) hours as needed.   12 each   0    Triage Vitals:  BP 144/78  Pulse 88  Temp(Src) 97.9 F (36.6 C) (Oral)  Resp 22  Ht 6\' 2"  (1.88 m)  Wt 306 lb (138.801 kg)  BMI 39.27 kg/m2  SpO2 98%  Physical Exam  Nursing note and vitals reviewed. Constitutional: He is oriented to person, place, and time. He appears well-developed and well-nourished. No distress.  HENT:  Head: Normocephalic and atraumatic.  Right Ear: Tympanic membrane and external ear normal.  Left Ear: Tympanic membrane and external ear normal.  Nose: Nose normal. Right sinus exhibits no maxillary sinus tenderness and no frontal sinus tenderness. Left sinus exhibits no maxillary sinus tenderness and no frontal sinus tenderness.  Eyes: Conjunctivae and EOM are normal. Pupils are equal, round, and reactive to light. Right eye exhibits no discharge. Left eye exhibits no discharge. Right  eye exhibits no nystagmus. Left eye exhibits no nystagmus.  Neck: Normal range of motion. Neck supple. No tracheal deviation present.  Cardiovascular: Normal rate, regular rhythm, normal heart sounds and intact distal pulses.   Pulmonary/Chest: Effort normal and breath sounds normal. No respiratory distress. He exhibits no tenderness.  Abdominal: Soft. Bowel sounds are normal. He exhibits no distension and no mass. There is no tenderness.  Musculoskeletal: Normal range of motion. He exhibits no edema and no tenderness.       Left wrist: He exhibits bony tenderness. He exhibits no swelling.       Arms: Neurological: He is alert and oriented to person, place, and time. He has normal strength and normal reflexes. No sensory deficit. He displays a negative Romberg sign. GCS eye subscore is 4. GCS verbal subscore is 5. GCS motor subscore is 6.  Reflex Scores:      Tricep reflexes are 2+ on the right side and 2+ on the left side.      Bicep reflexes are 2+ on the right side and 2+ on the left side.      Brachioradialis reflexes are 2+ on the right side and 2+ on the left side.      Patellar reflexes are 2+ on the right side and 2+ on the left side.      Achilles reflexes are 2+ on the right side and 2+ on the left side. Patient with normal gait without ataxia, shuffling, spasm, or antalgia. Speech is normal without dysarthria, dysphasia, or aphasia.   Skin: Skin is warm and dry. No rash noted.  Psychiatric: He has a normal mood and affect. His behavior is normal. Judgment and thought content normal.    ED Course  Procedures  DIAGNOSTIC STUDIES: Oxygen Saturation is 98% on room air, normal by my interpretation.    COORDINATION OF CARE: At 3:30 PM: Discussed treatment plan with patient which includes applying ice and a splint to the left wrist and immobilization of the left wrist as well. Pt is advised he needs to follow up with the doctor who repaired the initial fracture back in July 2014 due  to concerns of avascular necrosis. Patient agrees.   Labs Review Labs Reviewed - No data to display Imaging Review Dg Wrist Complete Left  08/06/2013   CLINICAL DATA:  Fall.  Injury  EXAM: LEFT WRIST - COMPLETE 3+ VIEW  COMPARISON:  12/15/2012  FINDINGS: Sclerotic and cystic changes in the proximal pole of the navicular suggestive of avascular necrosis which was not present previously. No acute fracture of the navicular.  Well corticated calcification posterior to the wrist may be due to old triquetral fracture.  Several small densities in  the soft tissues adjacent to the radial styloid may be foreign bodies. These were not present previously.  No definite acute fracture  IMPRESSION: Findings consistent with AVN of the proximal pole of the navicular. Consider MRI for further evaluation.  No definite acute fracture  Several small foreign bodies in the soft tissues as marked.   Electronically Signed   By: Franchot Gallo M.D.   On: 08/06/2013 15:25    EKG Interpretation   None       MDM   Final diagnoses:  Left wrist injury  Avascular necrosis of lunate   I personally performed the services described in this documentation, which was scribed in my presence. The recorded information has been reviewed and considered.     Shaune Pollack, MD 08/06/13 [phone]

## 2013-08-06 NOTE — ED Notes (Signed)
Patient fell and caught himself with his left hand. Already damaged from surgery in Sept. Now c/o mild swelling and pain in wrist area.

## 2014-05-23 ENCOUNTER — Encounter (HOSPITAL_BASED_OUTPATIENT_CLINIC_OR_DEPARTMENT_OTHER): Payer: Self-pay | Admitting: *Deleted

## 2014-05-23 ENCOUNTER — Emergency Department (HOSPITAL_BASED_OUTPATIENT_CLINIC_OR_DEPARTMENT_OTHER)
Admission: EM | Admit: 2014-05-23 | Discharge: 2014-05-23 | Disposition: A | Discharge status: Home / Self Care | Payer: BC Managed Care – PPO | Attending: Emergency Medicine | Admitting: Emergency Medicine

## 2014-05-23 DIAGNOSIS — Z87828 Personal history of other (healed) physical injury and trauma: Secondary | ICD-10-CM | POA: Insufficient documentation

## 2014-05-23 DIAGNOSIS — Z87891 Personal history of nicotine dependence: Secondary | ICD-10-CM | POA: Insufficient documentation

## 2014-05-23 DIAGNOSIS — J029 Acute pharyngitis, unspecified: Secondary | ICD-10-CM | POA: Insufficient documentation

## 2014-05-23 DIAGNOSIS — R51 Headache: Secondary | ICD-10-CM | POA: Insufficient documentation

## 2014-05-23 LAB — RAPID STREP SCREEN (MED CTR MEBANE ONLY): STREPTOCOCCUS, GROUP A SCREEN (DIRECT): NEGATIVE

## 2014-05-23 MED ORDER — LIDOCAINE VISCOUS 2 % MT SOLN
15.0000 mL | Freq: Once | OROMUCOSAL | Status: AC
  Administered 2014-05-23: 15 mL via OROMUCOSAL
  Filled 2014-05-23: qty 15

## 2014-05-23 MED ORDER — LIDOCAINE VISCOUS 2 % MT SOLN: 20.0000 mL | OROMUCOSAL | Status: DC | PRN

## 2014-05-23 MED ORDER — IBUPROFEN 800 MG PO TABS: 800.0000 mg | ORAL_TABLET | Freq: Three times a day (TID) | ORAL | Status: DC

## 2014-05-23 MED ORDER — ACETAMINOPHEN 500 MG PO TABS: 500.0000 mg | ORAL_TABLET | Freq: Four times a day (QID) | ORAL | Status: DC | PRN

## 2014-05-23 NOTE — ED Notes (Signed)
Sore throat x 2 days

## 2014-05-23 NOTE — Discharge Instructions (Signed)
Your rapid strep test was negative for a bacterial infection. Please follow up with your primary care physician in 1-2 days. If you do not have one please call the [facility] number listed above. Please alternate between Motrin and Tylenol every three hours for fevers and pain. Please read all discharge instructions and return precautions.   Pharyngitis Pharyngitis is redness, pain, and swelling (inflammation) of your pharynx.  CAUSES  Pharyngitis is usually caused by infection. Most of the time, these infections are from viruses (viral) and are part of a cold. However, sometimes pharyngitis is caused by bacteria (bacterial). Pharyngitis can also be caused by allergies. Viral pharyngitis may be spread from person to person by coughing, sneezing, and personal items or utensils (cups, forks, spoons, toothbrushes). Bacterial pharyngitis may be spread from person to person by more intimate contact, such as kissing.  SIGNS AND SYMPTOMS  Symptoms of pharyngitis include:   Sore throat.   Tiredness (fatigue).   Low-grade fever.   Headache.  Joint pain and muscle aches.  Skin rashes.  Swollen lymph nodes.  Plaque-like film on throat or tonsils (often seen with bacterial pharyngitis). DIAGNOSIS  Your health care provider will ask you questions about your illness and your symptoms. Your medical history, along with a physical exam, is often all that is needed to diagnose pharyngitis. Sometimes, a rapid strep test is done. Other lab tests may also be done, depending on the suspected cause.  TREATMENT  Viral pharyngitis will usually get better in 3-4 days without the use of medicine. Bacterial pharyngitis is treated with medicines that kill germs (antibiotics).  HOME CARE INSTRUCTIONS   Drink enough water and fluids to keep your urine clear or pale yellow.   Only take over-the-counter or prescription medicines as directed by your health care provider:   If you are  prescribed antibiotics, make sure you finish them even if you start to feel better.   Do not take aspirin.   Get lots of rest.   Gargle with 8 oz of salt water ( tsp of salt per 1 qt of water) as often as every 1-2 hours to soothe your throat.   Throat lozenges (if you are not at risk for choking) or sprays may be used to soothe your throat. SEEK MEDICAL CARE IF:   You have large, tender lumps in your neck.  You have a rash.  You cough up green, yellow-brown, or bloody spit. SEEK IMMEDIATE MEDICAL CARE IF:   Your neck becomes stiff.  You drool or are unable to swallow liquids.  You vomit or are unable to keep medicines or liquids down.  You have severe pain that does not go away with the use of recommended medicines.  You have trouble breathing (not caused by a stuffy nose). MAKE SURE YOU:   Understand these instructions.  Will watch your condition.  Will get help right away if you are not doing well or get worse. Document Released: 06/01/2005 Document Revised: 03/22/2013 Document Reviewed: 02/06/2013 [facility] Patient Information 2015 [place], [place]. This information is not intended to replace advice given to you by your health care provider. Make sure you discuss any questions you have with your health care provider.

## 2014-05-23 NOTE — ED Provider Notes (Signed)
CSN: [id number]     Arrival date & time 05/23/14  1940 History   First MD Initiated Contact with Patient 05/23/14 1946     Chief Complaint  Patient presents with  . Sore Throat     (Consider location/radiation/quality/duration/timing/severity/associated sxs/prior Treatment) HPI Comments: Patient is a 28 year old male presented to emergency department for 2 day history of sore throat generalized headache. Patient tried multiple over-the-counter medications with no improvement. No modifying factors identified. No sick contacts. Patient denies any fevers, chills, nausea, vomiting, cough, congestion, rhinorrhea, abdominal pain, diarrhea.    Past Medical History  Diagnosis Date  . Back injuries    Past Surgical History  Procedure Laterality Date  . Orif wrist fracture Left 12/15/2012    Procedure: OPEN REDUCTION INTERNAL FIXATION (ORIF) WRIST FRACTURE; Left.  Reduction, repair, and reconnstruction of perilunate fracture dislocation;  Surgeon: Roseanne Kaufman, MD;  Location: [facility];  Service: Orthopedics;  Laterality: Left;   History reviewed. No pertinent family history. History  Substance Use Topics  . Smoking status: Former Research scientist (life sciences)  . Smokeless tobacco: Not on file  . Alcohol Use: No     Comment: occasional    Review of Systems  Constitutional: Negative for fever and chills.  HENT: Positive for sore throat.   Neurological: Positive for headaches.  All other systems reviewed and are negative.     Allergies  Review of patient's allergies indicates no known allergies.  Home Medications   Prior to Admission medications   Medication Sig Start Date End Date Taking? Authorizing Provider  acetaminophen (TYLENOL) 500 MG tablet Take 1 tablet (500 mg total) by mouth every 6 (six) hours as needed. 05/23/14   Krisy Dix L Liz Pinho, PA-C  ibuprofen (ADVIL,MOTRIN) 800 MG tablet Take 1 tablet (800 mg total) by mouth 3 (three) times daily. 05/23/14   Rawlins Stuard L Jawuan Robb, PA-C  lidocaine  (XYLOCAINE) 2 % solution Use as directed 20 mLs in the mouth or throat as needed for mouth pain. 05/23/14   Steffany Schoenfelder L Sheneka Schrom, PA-C   BP 136/86 mmHg  Pulse 92  Temp(Src) 98.7 F (37.1 C) (Oral)  Resp 18  Ht 6\' 2"  (1.88 m)  Wt 325 lb (147.419 kg)  BMI 41.71 kg/m2  SpO2 99% Physical Exam  Constitutional: He is oriented to person, place, and time. He appears well-developed and well-nourished. No distress.  HENT:  Head: Normocephalic and atraumatic.  Right Ear: External ear normal.  Left Ear: External ear normal.  Nose: Nose normal.  Mouth/Throat: Oropharynx is clear and moist. No oropharyngeal exudate.  Pharynx erythematous w/o edema. No trismus.   Eyes: Conjunctivae are normal.  Neck: Normal range of motion. Neck supple.  Cardiovascular: Normal rate, regular rhythm and normal heart sounds.   Pulmonary/Chest: Effort normal and breath sounds normal.  Abdominal: Soft. There is no tenderness.  Musculoskeletal: Normal range of motion.  Lymphadenopathy:    He has cervical adenopathy.  Neurological: He is alert and oriented to person, place, and time.  Skin: Skin is warm and dry. He is not diaphoretic.  Psychiatric: He has a normal mood and affect.  Nursing note and vitals reviewed.   ED Course  Procedures (including critical care time) Medications  lidocaine (XYLOCAINE) 2 % viscous mouth solution 15 mL (15 mLs Mouth/Throat Given 05/23/14 2030)    Labs Review Labs Reviewed  RAPID STREP SCREEN  CULTURE, GROUP A STREP    Imaging Review No results found.   EKG Interpretation None      MDM   Final diagnoses:  Viral pharyngitis    Filed Vitals:   05/23/14 1949  BP: 136/86  Pulse: 92  Temp: 98.7 F (37.1 C)  Resp: 18   Afebrile, NAD, non-toxic appearing, AAOx4.  Pt afebrile without tonsillar exudate, negative strep. Presents with mild cervical lymphadenopathy, & dysphagia; diagnosis of viral pharyngitis. No abx indicated. DC w symptomatic tx for pain  Pt does  not appear dehydrated, but did discuss importance of water rehydration. Presentation non concerning for PTA or infxn spread to soft tissue. No trismus or uvula deviation. Specific return precautions discussed. Pt able to drink water in ED without difficulty with intact air way. Recommended PCP follow up. Patient is stable at time of discharge       Harlow Mares, PA-C 05/23/14 2052  Blanchie Dessert, MD 05/23/14 2311

## 2014-05-23 NOTE — ED Notes (Signed)
C/o sore throat x 2 days,  Denies cough, congestion or runny nose

## 2014-05-25 LAB — CULTURE, GROUP A STREP

## 2015-09-23 ENCOUNTER — Emergency Department (HOSPITAL_BASED_OUTPATIENT_CLINIC_OR_DEPARTMENT_OTHER)
Admission: EM | Admit: 2015-09-23 | Discharge: 2015-09-23 | Disposition: A | Discharge status: Home / Self Care | Payer: No Typology Code available for payment source | Attending: Emergency Medicine | Admitting: Emergency Medicine

## 2015-09-23 ENCOUNTER — Encounter (HOSPITAL_BASED_OUTPATIENT_CLINIC_OR_DEPARTMENT_OTHER): Payer: Self-pay | Admitting: Emergency Medicine

## 2015-09-23 DIAGNOSIS — Z87891 Personal history of nicotine dependence: Secondary | ICD-10-CM | POA: Insufficient documentation

## 2015-09-23 DIAGNOSIS — J029 Acute pharyngitis, unspecified: Secondary | ICD-10-CM | POA: Insufficient documentation

## 2015-09-23 LAB — RAPID STREP SCREEN (MED CTR MEBANE ONLY): Streptococcus, Group A Screen (Direct): NEGATIVE

## 2015-09-23 MED ORDER — IBUPROFEN 800 MG PO TABS: 800.0000 mg | ORAL_TABLET | Freq: Three times a day (TID) | ORAL | Status: DC | PRN

## 2015-09-23 MED ORDER — DEXAMETHASONE SODIUM PHOSPHATE 10 MG/ML IJ SOLN
10.0000 mg | Freq: Once | INTRAMUSCULAR | Status: AC
  Administered 2015-09-23: 10 mg via INTRAMUSCULAR
  Filled 2015-09-23: qty 1

## 2015-09-23 NOTE — Discharge Instructions (Signed)
1. Medications: Take ibuprofen as needed for sore throat, continue usual home medications 2. Treatment: rest, drink plenty of fluids, salt water gargles as needed for sore throat 3. Follow Up: Please follow up with your primary doctor in 3 days for discussion of your diagnoses and further evaluation after today's visit; Please return to the ER for fever, difficulty breathing or swallowing, new or worsening symptoms, any additional concerns.

## 2015-09-23 NOTE — ED Notes (Signed)
Patient is alert and oriented x3.  He was given DC instructions and follow up visit instructions.  Patient gave verbal understanding.  He was DC ambulatory under his own power to home.  V/S stable.  He was not showing any signs of distress on DC 

## 2015-09-23 NOTE — ED Notes (Signed)
Sore throat x 2 days

## 2015-09-23 NOTE — ED Notes (Signed)
PA at bedside.

## 2015-09-23 NOTE — ED Provider Notes (Signed)
CSN: [id number]     Arrival date & time 09/23/15  1015 History   First MD Initiated Contact with Patient 09/23/15 1022     Chief Complaint  Patient presents with  . Sore Throat     (Consider location/radiation/quality/duration/timing/severity/associated sxs/prior Treatment) Patient is a 30 y.o. male presenting with pharyngitis. The history is provided by the patient and medical records. No language interpreter was used.  Sore Throat Associated symptoms include a sore throat. Pertinent negatives include no abdominal pain, chills, congestion, coughing, fever, headaches, nausea, neck pain, rash or vomiting.  Donald Mayer is a 30 y.o. male  who presents to the Emergency Department complaining of sore throat x 2 days. Patient states two days ago, his throat was hurting, however yesterday he was symptom-free. He awoke today with similar throat pain and came to ED to be evaluated. Sore throat pain is worse with swallowing. Denies shortness of breath, fever, cough, congestion, chest pain, back pain. He endorses no symptoms other than sore throat. He has been able to handle secretions and swallow without difficulty. No medication taken prior to arrival for symptoms. Denies sick contacts.  Past Medical History  Diagnosis Date  . Back injuries    Past Surgical History  Procedure Laterality Date  . Orif wrist fracture Left 12/15/2012    Procedure: OPEN REDUCTION INTERNAL FIXATION (ORIF) WRIST FRACTURE; Left.  Reduction, repair, and reconnstruction of perilunate fracture dislocation;  Surgeon: Roseanne Kaufman, MD;  Location: [facility];  Service: Orthopedics;  Laterality: Left;   No family history on file. Social History  Substance Use Topics  . Smoking status: Former Research scientist (life sciences)  . Smokeless tobacco: None  . Alcohol Use: No     Comment: occasional    Review of Systems  Constitutional: Negative for fever and chills.  HENT: Positive for sore throat. Negative for congestion.   Eyes: Negative for  visual disturbance.  Respiratory: Negative for cough and shortness of breath.   Cardiovascular: Negative.   Gastrointestinal: Negative for nausea, vomiting and abdominal pain.  Musculoskeletal: Negative for back pain and neck pain.  Skin: Negative for rash.  Allergic/Immunologic: Negative for immunocompromised state.  Neurological: Negative for dizziness and headaches.      Allergies  Review of patient's allergies indicates no known allergies.  Home Medications   Prior to Admission medications   Medication Sig Start Date End Date Taking? Authorizing Provider  acetaminophen (TYLENOL) 500 MG tablet Take 1 tablet (500 mg total) by mouth every 6 (six) hours as needed. 05/23/14   Jennifer Piepenbrink, PA-C  ibuprofen (ADVIL,MOTRIN) 800 MG tablet Take 1 tablet (800 mg total) by mouth 3 (three) times daily as needed. 09/23/15   Ahliyah Nienow Pilcher Oral Hallgren, PA-C   BP 137/84 mmHg  Pulse 92  Temp(Src) 98.2 F (36.8 C) (Oral)  Resp 18  Ht 6\' 2"  (1.88 m)  Wt 154.223 kg  BMI 43.63 kg/m2  SpO2 100% Physical Exam  Constitutional: He is oriented to person, place, and time. He appears well-developed and well-nourished. No distress.  Speaking in full sentences.  HENT:  Head: Normocephalic and atraumatic.  OP with erythema and tonsillar swelling. No exudates. Airway is patent.  Neck: Normal range of motion. Neck supple.  No meningeal signs.   Cardiovascular: Normal rate, regular rhythm and normal heart sounds.   Pulmonary/Chest: Effort normal.  Lungs are clear to auscultation bilaterally - no w/r/r  Abdominal: Soft. He exhibits no distension. There is no tenderness.  Musculoskeletal: Normal range of motion.  Lymphadenopathy:  He has no cervical adenopathy.  Neurological: He is alert and oriented to person, place, and time.  Skin: Skin is warm and dry. He is not diaphoretic.  Nursing note and vitals reviewed.   ED Course  Procedures (including critical care time) Labs Review Labs Reviewed   RAPID STREP SCREEN (NOT AT [facility])  CULTURE, GROUP A STREP [facility])    Imaging Review No results found. I have personally reviewed and evaluated these images and lab results as part of my medical decision-making.   EKG Interpretation None      MDM   Final diagnoses:  Pharyngitis   Donald Mayer is a 30 y.o. male who presents to ED with 2 day history of sore throat. On exam, patient with tonsillar swelling and erythema, but no exudate. Airway patent. 100% O2 on RA. He is afebrile, handling secretions well, speaking in full sentences, and denies trouble breathing or swallowing. Vital signs were reassuring. Rapid strep negative. Decadron given in ED. Ibuprofen rx for home. Symptomatic home care was discussed. Return precautions were given including fever, difficulty breathing, difficulty swallowing, shortness of breath, any new or worsening symptoms, any additional concerns. PCP follow-up strongly encouraged. All questions answered.  [facility] Hamzeh Tall, PA-C 09/23/15 1204  Harvel Quale, MD 09/25/15 [phone]

## 2015-09-25 LAB — CULTURE, GROUP A STREP (THRC)

## 2016-04-28 ENCOUNTER — Emergency Department (HOSPITAL_BASED_OUTPATIENT_CLINIC_OR_DEPARTMENT_OTHER): Payer: Managed Care, Other (non HMO)

## 2016-04-28 ENCOUNTER — Encounter (HOSPITAL_BASED_OUTPATIENT_CLINIC_OR_DEPARTMENT_OTHER): Payer: Self-pay | Admitting: *Deleted

## 2016-04-28 ENCOUNTER — Emergency Department (HOSPITAL_BASED_OUTPATIENT_CLINIC_OR_DEPARTMENT_OTHER)
Admission: EM | Admit: 2016-04-28 | Discharge: 2016-04-28 | Disposition: A | Discharge status: Home / Self Care | Payer: Managed Care, Other (non HMO) | Source: Home / Self Care | Attending: Emergency Medicine | Admitting: Emergency Medicine

## 2016-04-28 DIAGNOSIS — Z87891 Personal history of nicotine dependence: Secondary | ICD-10-CM

## 2016-04-28 DIAGNOSIS — D330 Benign neoplasm of brain, supratentorial: Secondary | ICD-10-CM | POA: Diagnosis not present

## 2016-04-28 DIAGNOSIS — R531 Weakness: Secondary | ICD-10-CM

## 2016-04-28 DIAGNOSIS — R55 Syncope and collapse: Secondary | ICD-10-CM | POA: Insufficient documentation

## 2016-04-28 LAB — CBC WITH DIFFERENTIAL/PLATELET
BASOS ABS: 0 10*3/uL (ref 0.0–0.1)
BASOS PCT: 0 %
EOS PCT: 1 %
Eosinophils Absolute: 0.1 10*3/uL (ref 0.0–0.7)
HCT: 44.4 % (ref 39.0–52.0)
Hemoglobin: 14.4 g/dL (ref 13.0–17.0)
Lymphocytes Relative: 22 %
Lymphs Abs: 1.9 10*3/uL (ref 0.7–4.0)
MCH: 27.7 pg (ref 26.0–34.0)
MCHC: 32.4 g/dL (ref 30.0–36.0)
MCV: 85.5 fL (ref 78.0–100.0)
Monocytes Absolute: 0.5 10*3/uL (ref 0.1–1.0)
Monocytes Relative: 5 %
NEUTROS ABS: 6.2 10*3/uL (ref 1.7–7.7)
Neutrophils Relative %: 72 %
PLATELETS: 246 10*3/uL (ref 150–400)
RBC: 5.19 MIL/uL (ref 4.22–5.81)
RDW: 13.6 % (ref 11.5–15.5)
WBC: 8.7 10*3/uL (ref 4.0–10.5)

## 2016-04-28 LAB — BASIC METABOLIC PANEL
ANION GAP: 8 (ref 5–15)
BUN: 13 mg/dL (ref 6–20)
CALCIUM: 9.1 mg/dL (ref 8.9–10.3)
CO2: 25 mmol/L (ref 22–32)
CREATININE: 1.32 mg/dL — AB (ref 0.61–1.24)
Chloride: 102 mmol/L (ref 101–111)
GFR calc Af Amer: >60 mL/min (ref >60)
Glucose, Bld: 95 mg/dL (ref 65–99)
Potassium: 4.3 mmol/L (ref 3.5–5.1)
SODIUM: 135 mmol/L (ref 135–145)

## 2016-04-28 LAB — URINALYSIS, ROUTINE W REFLEX MICROSCOPIC
BILIRUBIN URINE: NEGATIVE
Glucose, UA: NEGATIVE mg/dL
Hgb urine dipstick: NEGATIVE
KETONES UR: NEGATIVE mg/dL
LEUKOCYTES UA: NEGATIVE
NITRITE: NEGATIVE
PH: 7 (ref 5.0–8.0)
PROTEIN: NEGATIVE mg/dL
Specific Gravity, Urine: 1.025 (ref 1.005–1.030)

## 2016-04-28 LAB — TROPONIN I: Troponin I: <0.03 ng/mL (ref <0.03)

## 2016-04-28 MED ORDER — SODIUM CHLORIDE 0.9 % IV BOLUS (SEPSIS)
1000.0000 mL | Freq: Once | INTRAVENOUS | Status: AC
  Administered 2016-04-28: 1000 mL via INTRAVENOUS

## 2016-04-28 NOTE — ED Notes (Signed)
Patient transported to X-ray 

## 2016-04-28 NOTE — Discharge Instructions (Signed)
Recommend to follow-up with a primary care office in the area for re-check. Return here for new concerns.

## 2016-04-28 NOTE — ED Triage Notes (Signed)
Pt reports that he had 2 syncopal episodes over the last 2 days.  reportss tjat it has happened when going from sitting to standing position.  Did hit head yesterday.

## 2016-04-28 NOTE — ED Provider Notes (Signed)
[name] Provider Note   CSN: [id number] Arrival date & time: 04/28/16  1600     History   Chief Complaint Chief Complaint  Patient presents with  . Near Syncope    HPI Donald Mayer is a 30 y.o. male.  The history is provided by the patient and medical records.  Near Syncope   30 y.o. M with hx of back injuries, presenting to the ED for syncope.Patient states he has passed out twice a day. He states the first time happened this morning when he was going with his brother to get something to eat. States it happened again this afternoon.  Denies head injury. He states when he stands up he gets very lightheaded. States overall he feels generally weak, this is been ongoing for the past 2 days. He denies any known illness. No cough, fever, nasal congestion, chest pain, shortness of breath, abdominal pain, nausea, vomiting or diarrhea. He states he has not eaten today but he has no appetite.  States no history of syncope in the past. He has no known cardiac issues or history of arrhythmia. No family cardiac history.  He is not a smoker.  Denies illicit drug use.  Past Medical History:  Diagnosis Date  . Back injuries     There are no active problems to display for this patient.   Past Surgical History:  Procedure Laterality Date  . ORIF WRIST FRACTURE Left 12/15/2012   Procedure: OPEN REDUCTION INTERNAL FIXATION (ORIF) WRIST FRACTURE; Left.  Reduction, repair, and reconnstruction of perilunate fracture dislocation;  Surgeon: Roseanne Kaufman, MD;  Location: [facility];  Service: Orthopedics;  Laterality: Left;       Home Medications    Prior to Admission medications   Medication Sig Start Date End Date Taking? Authorizing Provider  acetaminophen (TYLENOL) 500 MG tablet Take 1 tablet (500 mg total) by mouth every 6 (six) hours as needed. 05/23/14   Jennifer Piepenbrink, PA-C  ibuprofen (ADVIL,MOTRIN) 800 MG tablet Take 1 tablet (800 mg total) by mouth 3 (three)  times daily as needed. 09/23/15   Morton, PA-C    Family History History reviewed. No pertinent family history.  Social History Social History  Substance Use Topics  . Smoking status: Former Research scientist (life sciences)  . Smokeless tobacco: Not on file  . Alcohol use No     Comment: occasional     Allergies   Patient has no known allergies.   Review of Systems Review of Systems  Cardiovascular: Positive for near-syncope.  Neurological: Positive for syncope and weakness (generalized).  All other systems reviewed and are negative.    Physical Exam Updated Vital Signs Ht 6\' 2"  (1.88 m)   Wt (!) 156.5 kg   BMI 44.30 kg/m   Physical Exam  Constitutional: He is oriented to person, place, and time. He appears well-developed and well-nourished.  Morbidly obese  HENT:  Head: Normocephalic and atraumatic.  Mouth/Throat: Oropharynx is clear and moist.  Mucous membranes are moist  Eyes: Conjunctivae and EOM are normal. Pupils are equal, round, and reactive to light.  Neck: Normal range of motion.  Cardiovascular: Normal rate, regular rhythm and normal heart sounds.   Pulmonary/Chest: Effort normal and breath sounds normal. No respiratory distress. He has no wheezes.  Abdominal: Soft. Bowel sounds are normal. There is no tenderness. There is no rebound.  Musculoskeletal: Normal range of motion.  Neurological: He is alert and oriented to person, place, and time.  AAOx3, answering questions and following commands  appropriately; equal strength UE and LE bilaterally; CN grossly intact; moves all extremities appropriately without ataxia; no focal neuro deficits or facial asymmetry appreciated  Skin: Skin is warm and dry.  Psychiatric: He has a normal mood and affect.  Nursing note and vitals reviewed.    ED Treatments / Results  Labs (all labs ordered are listed, but only abnormal results are displayed) Labs Reviewed  BASIC METABOLIC PANEL - Abnormal; Notable for the following:        Result Value   Creatinine, Ser 1.32 (*)    All other components within normal limits  CBC WITH DIFFERENTIAL/PLATELET  URINALYSIS, ROUTINE W REFLEX MICROSCOPIC (NOT AT [facility])  TROPONIN I    EKG  EKG Interpretation  Date/Time:  Tuesday April 28 2016 16:17:31 EST Ventricular Rate:  74 PR Interval:    QRS Duration: 98 QT Interval:  378 QTC Calculation: 420 R Axis:   87 Text Interpretation:  Sinus rhythm Borderline repolarization abnormality since last tracing no significant change Confirmed by BELFI  MD, MELANIE ([id number]) on 04/28/2016 4:22:45 PM Also confirmed by BELFI  MD, MELANIE ([id number]), editor [place], Leda Gauze [phone])  on 04/28/2016 4:33:02 PM       Radiology Dg Chest 2 View  Result Date: 04/28/2016 CLINICAL DATA:  30 y/o  M; syncope and headache. EXAM: CHEST  2 VIEW COMPARISON:  None available. FINDINGS: Normal cardiomediastinal silhouette. Clear lungs. No pneumothorax or pleural effusion. Bones are unremarkable. IMPRESSION: No active cardiopulmonary disease. Electronically Signed   By: Kristine Garbe M.D.   On: 04/28/2016 16:36    Procedures Procedures (including critical care time)  Medications Ordered in ED Medications - No data to display   Initial Impression / Assessment and Plan / ED Course  I have reviewed the triage vital signs and the nursing notes.  Pertinent labs & imaging results that were available during my care of the patient were reviewed by me and considered in my medical decision making (see chart for details).  Clinical Course    30 y.o. M Here with syncope. Reports she has not eaten or had anything to drink all day. He is afebrile and nontoxic here. He is neurologically intact without any noted deficits. States he just feels really tired. EKG is nonischemic. Labwork is reassuring. Chest x-ray is clear. Patient was able to stand for his orthostatic, however could not be completed as he felt lightheaded. He was given a liter of IV fluids. Has  continued resting comfortably here. Patient does not have any chest pain or shortness of breath. He is PERC negative, low suspicion for PE is the etiology of his syncope.  Remains neurologically intact here-- head CT deferred at this time.  Has remained NSR on monitor here, no arrhythmias noted.  I have offered him food and oral fluids several times during the ED, he has refused stating he just wants to sleep. Given his benign exam and reassuring workup, feel he is stable for discharge. Symptoms may be due to hypoglycemic episodes as he has not eaten today.  I have recommended that he follow-up closely with a primary care physician in the area. Given resource guide to help with this.  Discussed plan with patient, he acknowledged understanding and agreed with plan of care.  Return precautions given for new or worsening symptoms.  Final Clinical Impressions(s) / ED Diagnoses   Final diagnoses:  Syncope, unspecified syncope type    New Prescriptions Discharge Medication List as of 04/28/2016  7:04 PM  Larene Pickett, PA-C 04/28/16 [place], MD 04/28/16 [phone]

## 2016-04-28 NOTE — ED Notes (Signed)
Pt became dizzy and was unable to stand for orthostatic vitals

## 2016-04-28 NOTE — ED Notes (Signed)
Pt mother unsure of pt ability to stay at home in current condition. Upon discharge pt was able to dress and stand to sit in wheelchair with no difficulty. Pt and family instructed to schedule follow-up tomorrow and to watch for worsening sx and to call 911 when appropriate. Verbalized understanding.

## 2016-04-30 ENCOUNTER — Telehealth: Payer: Self-pay | Admitting: Medical

## 2016-04-30 ENCOUNTER — Inpatient Hospital Stay (HOSPITAL_COMMUNITY)
Admission: EM | Admit: 2016-04-30 | Discharge: 2016-05-10 | DRG: 025 | Disposition: A | Discharge status: Home Health | Payer: Managed Care, Other (non HMO) | Attending: Internal Medicine | Admitting: Internal Medicine

## 2016-04-30 ENCOUNTER — Ambulatory Visit (INDEPENDENT_AMBULATORY_CARE_PROVIDER_SITE_OTHER): Payer: Managed Care, Other (non HMO) | Admitting: Medical

## 2016-04-30 ENCOUNTER — Encounter: Payer: Self-pay | Admitting: Medical

## 2016-04-30 ENCOUNTER — Emergency Department (HOSPITAL_COMMUNITY): Payer: Managed Care, Other (non HMO)

## 2016-04-30 ENCOUNTER — Encounter (HOSPITAL_COMMUNITY): Payer: Self-pay | Admitting: Internal Medicine

## 2016-04-30 ENCOUNTER — Encounter (HOSPITAL_COMMUNITY): Payer: Self-pay | Admitting: Emergency Medicine

## 2016-04-30 VITALS — BP 124/84 | HR 77 | Temp 98.1°F | Ht 73.0 in | Wt 343.6 lb

## 2016-04-30 DIAGNOSIS — R531 Weakness: Secondary | ICD-10-CM | POA: Diagnosis present

## 2016-04-30 DIAGNOSIS — L568 Other specified acute skin changes due to ultraviolet radiation: Secondary | ICD-10-CM

## 2016-04-30 DIAGNOSIS — R5383 Other fatigue: Secondary | ICD-10-CM | POA: Diagnosis not present

## 2016-04-30 DIAGNOSIS — Z803 Family history of malignant neoplasm of breast: Secondary | ICD-10-CM

## 2016-04-30 DIAGNOSIS — R51 Headache: Secondary | ICD-10-CM

## 2016-04-30 DIAGNOSIS — Z6841 Body Mass Index (BMI) 40.0 and over, adult: Secondary | ICD-10-CM | POA: Diagnosis not present

## 2016-04-30 DIAGNOSIS — Z801 Family history of malignant neoplasm of trachea, bronchus and lung: Secondary | ICD-10-CM

## 2016-04-30 DIAGNOSIS — R930 Abnormal findings on diagnostic imaging of skull and head, not elsewhere classified: Secondary | ICD-10-CM | POA: Diagnosis not present

## 2016-04-30 DIAGNOSIS — D330 Benign neoplasm of brain, supratentorial: Secondary | ICD-10-CM | POA: Diagnosis present

## 2016-04-30 DIAGNOSIS — D496 Neoplasm of unspecified behavior of brain: Secondary | ICD-10-CM

## 2016-04-30 DIAGNOSIS — R42 Dizziness and giddiness: Secondary | ICD-10-CM

## 2016-04-30 DIAGNOSIS — Z452 Encounter for adjustment and management of vascular access device: Secondary | ICD-10-CM

## 2016-04-30 DIAGNOSIS — G936 Cerebral edema: Secondary | ICD-10-CM | POA: Diagnosis present

## 2016-04-30 DIAGNOSIS — R0600 Dyspnea, unspecified: Secondary | ICD-10-CM | POA: Diagnosis not present

## 2016-04-30 DIAGNOSIS — G939 Disorder of brain, unspecified: Secondary | ICD-10-CM | POA: Diagnosis not present

## 2016-04-30 DIAGNOSIS — R6889 Other general symptoms and signs: Secondary | ICD-10-CM

## 2016-04-30 DIAGNOSIS — G9389 Other specified disorders of brain: Secondary | ICD-10-CM | POA: Diagnosis present

## 2016-04-30 DIAGNOSIS — R519 Headache, unspecified: Secondary | ICD-10-CM

## 2016-04-30 DIAGNOSIS — R55 Syncope and collapse: Secondary | ICD-10-CM | POA: Diagnosis not present

## 2016-04-30 LAB — RAPID URINE DRUG SCREEN, HOSP PERFORMED
AMPHETAMINES: NOT DETECTED
BENZODIAZEPINES: NOT DETECTED
Barbiturates: NOT DETECTED
Cocaine: NOT DETECTED
OPIATES: NOT DETECTED
Tetrahydrocannabinol: NOT DETECTED

## 2016-04-30 LAB — URINALYSIS, ROUTINE W REFLEX MICROSCOPIC
Bilirubin Urine: NEGATIVE
Glucose, UA: NEGATIVE mg/dL
Hgb urine dipstick: NEGATIVE
Ketones, ur: NEGATIVE mg/dL
Leukocytes, UA: NEGATIVE
NITRITE: NEGATIVE
PH: 6 (ref 5.0–8.0)
Protein, ur: NEGATIVE mg/dL
SPECIFIC GRAVITY, URINE: 1.027 (ref 1.005–1.030)

## 2016-04-30 LAB — BASIC METABOLIC PANEL
ANION GAP: 8 (ref 5–15)
BUN: 12 mg/dL (ref 6–20)
CALCIUM: 9.2 mg/dL (ref 8.9–10.3)
CO2: 22 mmol/L (ref 22–32)
Chloride: 107 mmol/L (ref 101–111)
Creatinine, Ser: 1.26 mg/dL — ABNORMAL HIGH (ref 0.61–1.24)
Glucose, Bld: 99 mg/dL (ref 65–99)
POTASSIUM: 4.4 mmol/L (ref 3.5–5.1)
Sodium: 137 mmol/L (ref 135–145)

## 2016-04-30 LAB — CBG MONITORING, ED: GLUCOSE-CAPILLARY: 98 mg/dL (ref 65–99)

## 2016-04-30 LAB — CBC
HEMATOCRIT: 44.2 % (ref 39.0–52.0)
HEMOGLOBIN: 15 g/dL (ref 13.0–17.0)
MCH: 28.7 pg (ref 26.0–34.0)
MCHC: 33.9 g/dL (ref 30.0–36.0)
MCV: 84.7 fL (ref 78.0–100.0)
Platelets: 243 10*3/uL (ref 150–400)
RBC: 5.22 MIL/uL (ref 4.22–5.81)
RDW: 13.4 % (ref 11.5–15.5)
WBC: 7.6 10*3/uL (ref 4.0–10.5)

## 2016-04-30 LAB — CK: Total CK: 317 U/L (ref 49–397)

## 2016-04-30 LAB — TSH: TSH: 1.203 u[IU]/mL (ref 0.350–4.500)

## 2016-04-30 LAB — ETHANOL: Alcohol, Ethyl (B): <5 mg/dL (ref <5)

## 2016-04-30 MED ORDER — ACETAMINOPHEN 325 MG PO TABS
650.0000 mg | ORAL_TABLET | Freq: Four times a day (QID) | ORAL | Status: DC | PRN
  Administered 2016-05-03 – 2016-05-08 (×3): 650 mg via ORAL
  Filled 2016-04-30 (×3): qty 2

## 2016-04-30 MED ORDER — SODIUM CHLORIDE 0.9 % IV SOLN
INTRAVENOUS | Status: AC
  Administered 2016-04-30 – 2016-05-01 (×2): via INTRAVENOUS
  Administered 2016-05-01: 1000 mL via INTRAVENOUS

## 2016-04-30 MED ORDER — ONDANSETRON HCL 4 MG/2ML IJ SOLN
4.0000 mg | Freq: Four times a day (QID) | INTRAMUSCULAR | Status: DC | PRN
  Administered 2016-05-04: 4 mg via INTRAVENOUS
  Filled 2016-04-30 (×2): qty 2

## 2016-04-30 MED ORDER — ONDANSETRON HCL 4 MG PO TABS: 4.0000 mg | ORAL_TABLET | Freq: Four times a day (QID) | ORAL | Status: DC | PRN

## 2016-04-30 MED ORDER — ACETAMINOPHEN 650 MG RE SUPP: 650.0000 mg | Freq: Four times a day (QID) | RECTAL | Status: DC | PRN

## 2016-04-30 MED ORDER — GADOBENATE DIMEGLUMINE 529 MG/ML IV SOLN
20.0000 mL | Freq: Once | INTRAVENOUS | Status: AC
  Administered 2016-04-30: 20 mL via INTRAVENOUS

## 2016-04-30 MED ORDER — OXYCODONE-ACETAMINOPHEN 5-325 MG PO TABS
1.0000 | ORAL_TABLET | ORAL | Status: DC | PRN
  Administered 2016-04-30: 1 via ORAL
  Filled 2016-04-30: qty 1

## 2016-04-30 NOTE — Progress Notes (Signed)
Pre visit review using our clinic review tool, if applicable. No additional management support is needed unless otherwise documented below in the visit note./hsm  

## 2016-04-30 NOTE — Telephone Encounter (Signed)
Pt seen the today first time. I sent to ED. 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 He was with his mom. She used to work at old [employer] office per pt mom. Not sure if mom on his [employer] form. She did come with him to visit and went to ED with him. Can you call and see how they are on this coming Monday? That is if they don't follow up with me on that day.

## 2016-04-30 NOTE — Progress Notes (Signed)
MRI has been completed. There is an intra-axial lesion within the anterior right frontal lobe with central necrotic features and peripheral vasogenic edema; there is mild mass effect and no enhancement. I agree with the differential diagnosis documented in the radiology report. Also on the DDx would be mid- to high grade astrocytoma given central necrotic features. The patient will need a neurosurgical evaluation for biopsy. An EEG has been ordered given possibility of an atypical seizure as the etiology of the patient's episode of LOC, although this is felt to be less likely than orthostasis or hypoglycemia. Discussed with ED attending.   Electronically signed: Dr. Kerney Elbe

## 2016-04-30 NOTE — H&P (Signed)
History and Physical    Donald Mayer [id number] DOB: 09-23-1985 DOA: 04/30/2016  PCP: Mackie Pai, PA-C  Patient coming from: Home.  Chief Complaint: Weakness and loss of consciousness.  HPI: Donald Mayer is a 30 y.o. male with no significant past medical history presents to the ER because of weakness and lightheadedness and syncopal episode 2 days ago. Patient states 2 days ago he was trying to go to a restaurant to eat when patient had a brief episode of syncopal episode. Since then patient has been having global headache lightheadedness and weakness. Patient also has been having photophobia. CT scan of the head done in the ER shows right frontotemporal mass versus infarct which was followed by MRI brain which shows right frontal mass concerning for neoplasm. Neurologist and neurosurgery has been consulted and patient has been admitted for further management.   ED Course: MRI brain was showing - 3.1 x 4.3 x 3.4 cm RIGHT frontal lobe nonenhancing mass. Neurosurgeon Dr. Kathyrn Sheriff has been consulted.  Review of Systems: As per HPI, rest all negative.   Past Medical History:  Diagnosis Date  . Back injuries     Past Surgical History:  Procedure Laterality Date  . ORIF WRIST FRACTURE Left 12/15/2012   Procedure: OPEN REDUCTION INTERNAL FIXATION (ORIF) WRIST FRACTURE; Left.  Reduction, repair, and reconnstruction of perilunate fracture dislocation;  Surgeon: Roseanne Kaufman, MD;  Location: [facility];  Service: Orthopedics;  Laterality: Left;     has an unknown smoking status. He has never used smokeless tobacco. He reports that he drinks alcohol. He reports that he does not use drugs.  No Known Allergies  Family History  Problem Relation Age of Onset  . Breast cancer Maternal Grandmother   . Lung cancer Maternal Grandfather   . Brain cancer Neg Hx     Prior to Admission medications   Medication Sig Start Date End Date Taking? Authorizing Provider  acetaminophen  (TYLENOL) 500 MG tablet Take 1 tablet (500 mg total) by mouth every 6 (six) hours as needed. Patient not taking: Reported on 04/30/2016 05/23/14   Baron Sane, PA-C  ibuprofen (ADVIL,MOTRIN) 800 MG tablet Take 1 tablet (800 mg total) by mouth 3 (three) times daily as needed. Patient not taking: Reported on 04/30/2016 09/23/15   Ozella Almond Ward, PA-C    Physical Exam: Vitals:   04/30/16 1940 04/30/16 2000 04/30/16 2030 04/30/16 2101  BP: 138/96 147/90 105/78 (!) 146/69  Pulse: 75 76 78 74  Resp:  17 16 18   Temp:    98.2 F (36.8 C)  TempSrc:    Oral  SpO2: 99% 98% 97% 97%  Weight:    (!) 155.4 kg (342 lb 8 oz)  Height:    6\' 1"  (1.854 m)      Constitutional: Obese not in distress. Vitals:   04/30/16 1940 04/30/16 2000 04/30/16 2030 04/30/16 2101  BP: 138/96 147/90 105/78 (!) 146/69  Pulse: 75 76 78 74  Resp:  17 16 18   Temp:    98.2 F (36.8 C)  TempSrc:    Oral  SpO2: 99% 98% 97% 97%  Weight:    (!) 155.4 kg (342 lb 8 oz)  Height:    6\' 1"  (1.854 m)   Eyes: Anicteric no pallor. ENMT: No discharge from the ears eyes nose or mouth. Neck: No neck rigidity no mass felt. Respiratory: No rhonchi or crepitations. Cardiovascular: S1-S2 no murmurs appreciated. Abdomen: Soft nontender bowel sounds present. No guarding or rigidity. Musculoskeletal: No  edema. No joint effusion. Skin: No rash. Skin appears warm. Neurologic: Alert awake oriented to time place and person. Moves all extremities 585. Psychiatric: Appears normal. Normal affect.   Labs on Admission: I have personally reviewed following labs and imaging studies  CBC:  Recent Labs Lab 04/28/16 1648 04/30/16 1106  WBC 8.7 7.6  NEUTROABS 6.2  --   HGB 14.4 15.0  HCT 44.4 44.2  MCV 85.5 84.7  PLT 246 0000000   Basic Metabolic Panel:  Recent Labs Lab 04/28/16 1648 04/30/16 1106  NA 135 137  K 4.3 4.4  CL 102 107  CO2 25 22  GLUCOSE 95 99  BUN 13 12  CREATININE 1.32* 1.26*  CALCIUM 9.1 9.2    GFR: Estimated Creatinine Clearance: 135.9 mL/min (by C-G formula based on SCr of 1.26 mg/dL (H)). Liver Function Tests: No results for input(s): AST, ALT, ALKPHOS, BILITOT, PROT, ALBUMIN in the last 168 hours. No results for input(s): LIPASE, AMYLASE in the last 168 hours. No results for input(s): AMMONIA in the last 168 hours. Coagulation Profile: No results for input(s): INR, PROTIME in the last 168 hours. Cardiac Enzymes:  Recent Labs Lab 04/28/16 1648 04/30/16 1335  CKTOTAL  --  317  TROPONINI <0.03  --    BNP (last 3 results) No results for input(s): PROBNP in the last 8760 hours. HbA1C: No results for input(s): HGBA1C in the last 72 hours. CBG:  Recent Labs Lab 04/30/16 1207  GLUCAP 98   Lipid Profile: No results for input(s): CHOL, HDL, LDLCALC, TRIG, CHOLHDL, LDLDIRECT in the last 72 hours. Thyroid Function Tests:  Recent Labs  04/30/16 1335  TSH 1.203   Anemia Panel: No results for input(s): VITAMINB12, FOLATE, FERRITIN, TIBC, IRON, RETICCTPCT in the last 72 hours. Urine analysis:    Component Value Date/Time   COLORURINE AMBER (A) 04/30/2016 1209   APPEARANCEUR CLEAR 04/30/2016 1209   LABSPEC 1.027 04/30/2016 1209   PHURINE 6.0 04/30/2016 1209   GLUCOSEU NEGATIVE 04/30/2016 1209   HGBUR NEGATIVE 04/30/2016 [place] 04/30/2016 [place] 04/30/2016 1209   PROTEINUR NEGATIVE 04/30/2016 1209   UROBILINOGEN 1.0 05/10/2011 1635   NITRITE NEGATIVE 04/30/2016 1209   LEUKOCYTESUR NEGATIVE 04/30/2016 1209   Sepsis Labs: @LABRCNTIP (procalcitonin:4,lacticidven:4) )No results found for this or any previous visit (from the past 240 hour(s)).   Radiological Exams on Admission: Ct Head Wo Contrast  Result Date: 04/30/2016 CLINICAL DATA:  [place] 3 days ago and hit head. Complaining of weakness. EXAM: CT HEAD WITHOUT CONTRAST TECHNIQUE: Contiguous axial images were obtained from the base of the skull through the vertex  without intravenous contrast. COMPARISON:  None. FINDINGS: Brain: Area of low attenuation in the frontotemporal area on the right side of uncertain etiology. It does not have the typical appearance of an old infarct. A subacute infarct is possible. There is no encephalomalacia or ex vacuo dilatation of the frontal horn of the right lateral ventricle. There is no mass effect on the adjacent brain parenchyma. It does not have the appearance of an arachnoid cyst. Neoplasm needs to be excluded. Recommend MRI brain without and with contrast for further evaluation. No intracranial hemorrhage or extra-axial fluid collections. The brainstem and cerebellum are unremarkable. Vascular: No vascular calcifications for hyperdense vessels. Skull: There is thinning of the calvarium in the region of the right frontal lesion. No bone lesion or fracture. Sinuses/Orbits: The paranasal sinuses and mastoid air cells are clear. The globes are intact. Other: No scalp lesion  nodes or hematoma. IMPRESSION: Indeterminate area of low attenuation in the right frontotemporal area. Subacute infarct versus neoplasm. Recommend MRI brain without and with contrast for further evaluation. These results were called by telephone at the time of interpretation on 04/30/2016 at 1:33 pm to Dr. Merrily Pew , who verbally acknowledged these results. Electronically Signed   By: Marijo Sanes M.D.   On: 04/30/2016 13:33   Mr [name] And Wo Contrast  Result Date: 04/30/2016 CLINICAL DATA:  Generalized weakness, lightheadedness and fatigue. Syncopal episode 2 days ago. EXAM: MRI HEAD WITHOUT AND WITH CONTRAST TECHNIQUE: Multiplanar, multiecho pulse sequences of the brain and surrounding structures were obtained without and with intravenous contrast. CONTRAST:  28mL MULTIHANCE GADOBENATE DIMEGLUMINE 529 MG/ML IV SOLN COMPARISON:  CT HEAD April 30, 2016 at 1257 hours FINDINGS: BRAIN: RIGHT frontal lobe 3.1 x 4.3 x 3.4 cm (transverse by AP by CC) RIGHT  inferior frontal lobe low T1, heterogeneously bright T2 mass without enhancement extending to the cortex with local mass effect and surrounding vasogenic edema. Faint central linear susceptibility artifact and consistent with vascularity. T2 shine through without reduced diffusion. No midline shift. Ventricles and sulci are otherwise normal for patient's age. No abnormal extra-axial fluid collections. 8 mm pineal cyst. VASCULAR: Normal major intracranial vascular flow voids present at skull base. SKULL AND UPPER CERVICAL SPINE: No abnormal sellar expansion. No suspicious calvarial bone marrow signal. Craniocervical junction maintained. SINUSES/ORBITS: The mastoid air-cells and included paranasal sinuses are well-aerated. The included ocular globes and orbital contents are non-suspicious. OTHER: None. 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 Local mass effect without midline shift. Acute findings discussed with and reconfirmed by Dr.Wentz on 04/30/2016 at 7:05 pm. Electronically Signed   By: Elon Alas M.D.   On: 04/30/2016 19:01    EKG: Independently reviewed. Normal sinus rhythm with nonspecific T-wave changes.  Assessment/Plan Principal Problem:   Brain mass Active Problems:   Generalized weakness   Nonintractable headache    1. Right frontal lobe mass - I have discussed with neurologist on call who had advised to get EEG. At this time no indication for steroids or antiepileptics. Neurosurgery, Dr. Kathyrn Sheriff, will be seeing patient in consult for further recommendations. 2. Generalized weakness - patient states he feels weak on walking. Probably orthostatic. Will gently hydrate. Since patient also has some T-wave changes will check troponin. 3. Headache probably from fall and syncope - when necessary pain medications for now. 4. Recent syncope - per  neurology patient may have had an atypical seizure. Follow EEG.   DVT prophylaxis: SCDs. Code Status: Full code.  Family Communication: Patient's family at the bedside.  Disposition Plan: Home.  Consults called: Neurology and neurosurgery.  Admission status: Inpatient.    Rise Patience MD [facility] Pager [phone].  If 7PM-7AM, please contact night-coverage [email or web address] Password [email or web address]  04/30/2016, 10:13 PM

## 2016-04-30 NOTE — ED Provider Notes (Signed)
[facility] DEPT Provider Note   CSN: [id number] Arrival date & time: 04/30/16  1040     History   Chief Complaint Chief Complaint  Patient presents with  . Loss of Consciousness    HPI Donald Mayer is a 30 y.o. male who presents with lightheadedness, generalized weakness, and fatigue. No significant past medical history. Patient states that he passed out 2 days ago. He was feeling lightheaded and weak that day. His brother took him out to get some food and when he got out of the car he had a syncopal episode and hit his head. He continued to feel lightheaded and weak after the incident. He was evaluated at [facility] ED that day. Labs were unremarkable and urine was clean. Troponin was normal. Chest x-ray and EKG were normal. Patient was evaluated at his primary care doctor office today. Due to concern of inability to walk long distances he was sent to the ED for repeat evaluation. Patient reports currently headache, photophobia. Denies fever, chills, syncope, neck pain, chest pain, shortness of breath, abdominal pain, nausea, vomiting, melena/hematochezia. He denies any rash or recent tick bite. Denies URI symptoms or sore throat. Denies seizure like activity history of seizure.  HPI  Past Medical History:  Diagnosis Date  . Back injuries     There are no active problems to display for this patient.   Past Surgical History:  Procedure Laterality Date  . ORIF WRIST FRACTURE Left 12/15/2012   Procedure: OPEN REDUCTION INTERNAL FIXATION (ORIF) WRIST FRACTURE; Left.  Reduction, repair, and reconnstruction of perilunate fracture dislocation;  Surgeon: Roseanne Kaufman, MD;  Location: [facility];  Service: Orthopedics;  Laterality: Left;       Home Medications    Prior to Admission medications   Medication Sig Start Date End Date Taking? Authorizing Provider  acetaminophen (TYLENOL) 500 MG tablet Take 1 tablet (500 mg total) by mouth every 6 (six) hours as needed.  05/23/14   Jennifer Piepenbrink, PA-C  ibuprofen (ADVIL,MOTRIN) 800 MG tablet Take 1 tablet (800 mg total) by mouth 3 (three) times daily as needed. 09/23/15   [place], PA-C    Family History History reviewed. No pertinent family history.  Social History Social History  Substance Use Topics  . Smoking status: Former Research scientist (life sciences)  . Smokeless tobacco: Never Used  . Alcohol use Yes     Comment: occasional     Allergies   Patient has no known allergies.   Review of Systems Review of Systems  Constitutional: Positive for fatigue. Negative for chills and fever.  HENT: Negative for sore throat.   Respiratory: Negative for shortness of breath.   Cardiovascular: Negative for chest pain.  Gastrointestinal: Negative for abdominal pain, nausea and vomiting.  Musculoskeletal: Positive for myalgias. Negative for neck pain and neck stiffness.  Neurological: Positive for syncope, weakness, light-headedness and headaches. Negative for dizziness, seizures, speech difficulty and numbness.     Physical Exam Updated Vital Signs BP 140/88   Pulse 78   Temp 98.2 F (36.8 C) (Oral)   Resp 19   Ht 6\' 2"  (1.88 m)   Wt (!) 155.6 kg   SpO2 100%   BMI 44.04 kg/m   Physical Exam  Constitutional: He is oriented to person, place, and time. He appears well-developed and well-nourished. No distress.  Obese, NAD  HENT:  Head: Normocephalic and atraumatic.  Eyes: Conjunctivae are normal. Pupils are equal, round, and reactive to light. Right eye exhibits no discharge. Left eye exhibits  no discharge. No scleral icterus.  Neck: Normal range of motion.  Cardiovascular: Normal rate and regular rhythm.  Exam reveals no gallop and no friction rub.   No murmur heard. Pulmonary/Chest: Effort normal and breath sounds normal. No respiratory distress. He has no wheezes. He has no rales. He exhibits no tenderness.  Abdominal: Soft. Bowel sounds are normal. He exhibits no distension and no mass. There is  no tenderness. There is no rebound and no guarding. No hernia.  Neurological: He is alert and oriented to person, place, and time.  Mental Status:  Alert, oriented, thought content appropriate, able to give a coherent history. Speech fluent without evidence of aphasia. Able to follow 2 step commands without difficulty.  Cranial Nerves:  II:  Peripheral visual fields grossly normal, pupils equal, round, reactive to light. Marked photophobia. III,IV, VI: ptosis not present, extra-ocular motions intact bilaterally  V,VII: smile symmetric, facial light touch sensation equal VIII: hearing grossly normal to voice  X: uvula elevates symmetrically  XI: bilateral shoulder shrug symmetric and strong XII: midline tongue extension without fassiculations Motor:  Normal tone. 3/5 in upper extremities bilaterally with weak grip. 4/5 in lower extremities bilaterally. Sensory: Pinprick and light touch normal in all extremities.  Cerebellar: normal finger-to-nose with bilateral upper extremities Gait: Able to stand - got too lightheaded to walk CV: distal pulses palpable throughout    Skin: Skin is warm and dry.  Psychiatric: He has a normal mood and affect. His behavior is normal.  Nursing note and vitals reviewed.    ED Treatments / Results  Labs (all labs ordered are listed, but only abnormal results are displayed) Labs Reviewed  BASIC METABOLIC PANEL - Abnormal; Notable for the following:       Result Value   Creatinine, Ser 1.26 (*)    All other components within normal limits  URINALYSIS, ROUTINE W REFLEX MICROSCOPIC (NOT AT [facility]) - Abnormal; Notable for the following:    Color, Urine AMBER (*)    All other components within normal limits  CBC  RAPID URINE DRUG SCREEN, HOSP PERFORMED  ETHANOL  TSH  CBG MONITORING, ED    EKG  EKG Interpretation  Date/Time:  Thursday April 30 2016 11:04:37 EST Ventricular Rate:  91 PR Interval:  146 QRS Duration: 86 QT Interval:  368 QTC  Calculation: 452 R Axis:   70 Text Interpretation:  Normal sinus rhythm Nonspecific T wave abnormality Abnormal ECG No significant change since last tracing Confirmed by [facility] MD, Corene Cornea [id number]) on 04/30/2016 11:04:29 AM       Radiology Dg Chest 2 View  Result Date: 04/28/2016 CLINICAL DATA:  30 y/o  M; syncope and headache. EXAM: CHEST  2 VIEW COMPARISON:  None available. FINDINGS: Normal cardiomediastinal silhouette. Clear lungs. No pneumothorax or pleural effusion. Bones are unremarkable. IMPRESSION: No active cardiopulmonary disease. Electronically Signed   By: Kristine Garbe M.D.   On: 04/28/2016 16:36    Procedures Procedures (including critical care time)  Medications Ordered in ED Medications - No data to display   Initial Impression / Assessment and Plan / ED Course  I have reviewed the triage vital signs and the nursing notes.  Pertinent labs & imaging results that were available during my care of the patient were reviewed by me and considered in my medical decision making (see chart for details).  Clinical Course    30 year old male presents with generalized weakness, fatigue, lightheadedness with a syncopal episode. Patient is afebrile, not tachycardic or tachypneic,  normotensive, and not hypoxic. Orthostatics are negative. Neuro exam notable for marked upper extremity weakness and significant lightheadedness with standing. Labs are unremarkable. UA clean, UDS negative. ETOH <5. CT remarkable for mass vs subacute infarct.  Shared visit with Dr. Dayna Barker. MRI with and without contrast ordered to further evaluate. Spoke with Neurology who will come to see and evaluate patient. Likely he will need admission for profound symptoms. Patient signed out to A. Meyer PA-C who will follow up on MRI results.  Final Clinical Impressions(s) / ED Diagnoses   Final diagnoses:  Generalized weakness    New Prescriptions New Prescriptions   No medications on file     Recardo Evangelist, PA-C 04/30/16 [place], MD 04/30/16 [phone]

## 2016-04-30 NOTE — ED Provider Notes (Signed)
4:17 PM: Care assumed from [facility], PA-C.   Donald Mayer is a 30 y.o. male with no significant PMHx presents to ED with lightheadedness, generalized weakness, and fatigue. Syncopal event 2 days ago seen at [facility] work up. Follow up with PCP today, sent to ED for re-eval secondary to increasing weakness. Assocaated headache and photophobia. Upper extremity weakness on neuro exam. Found to be symptomatic with standing. Labs grossly nml today. CT head shows mass vs. Subacute infarct. MRI pending for further evaluation. Neurology consulted - agree to see patient. Pt likely to need admission given functional decline.   Blood pressure 121/68, pulse 76, temperature 98.2 F (36.8 C), temperature source Oral, resp. rate 21, height 6\' 2"  (1.88 m), weight (!) 155.6 kg, SpO2 99 %.  Lungs are CTABL. Heart RRR. Abdomen +BS, soft, non-distended, non-tender. Patient is very photophobic on exam. 3/5 strength in UE b/l. Sensation in UE intact. Strength sensation in LE intact. Nml finger-to-nose.   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 Spoke with Dr. Cheral Marker of Neurology, greatly appreciate his time. Recommend medical admission and consult to neurosurgery for biopsy. Order EEG. Hold on anti-convulsants at this time. Discussed results and plan with pt and family at bedside. Agreeable with plan. Consult to neurosurgery and hospitalist placed.   7:51 PM: Spoke with Dr. Kathyrn Sheriff, appreciate his time an input. Agrees to see patient, will likely need mass removed.    7:53 PM: Spoke with Dr. Hal Hope, appreciate his time and input. Agrees to admit patient for further evaluation and management of brain mass. Thank you for your continued care of this patient.    Roxanna Mew, PA-C 04/30/16 1956    Daleen Bo, MD 04/30/16 [phone]

## 2016-04-30 NOTE — Consult Note (Signed)
Neurology Consult Note  Reason for Consultation: Abnormal head CT  Requesting provider: Janetta Hora, PA  CC: lightheadedness when upright, headache  HPI: this is a 30 year old right-handed man who presents to the emergency department for evaluation of an episode where he passed out. History is obtained directly from the patient. Corroborating information is obtained from review of available medical records. Family members were present for part of the interview and offered details as well.  The patient reports that he was in his usual state of normal health until 04/27/16. He states that he was very tired that day and slept most of the day. The next day, he remained very tired. His brother called and picked him up taken up to get something to eat for his birthday. He states that when they arrived at [employer], he was getting out of the car when he apparently passed out and fell to the ground. He states that he struck the back of his head when he hit the ground. He has had some tenderness to the right posterior head with nonspecific headache since this episode. In addition, he states that anytime he sits up or tries to stand, he gets very lightheaded and feels as if he will pass out again. He describes generalized weakness, no focal or lateralizing symptoms. He has never had a significant problem with headaches in the past. He denies any similar problems with lightheadedness.  E presented to the [facility] emergency department on 04/28/16 following this episode where he passed out.he again complained of feeling generalized weakness and getting lightheaded when he stands up. He stated at that time that the symptoms been present for the previous 2 days.he was not noted to have any obvious neurological deficits.he had an EKG which was unrevealing. Labs are unremarkable, as well as chest x-ray. They attempted to check orthostatic vital signs but these were unable to be completed because it became too  symptomatic when they were attempted. He was given IV fluids for his orthostasis. No imaging of the head was performed the time. It was felt the symptoms may have been related to hypoglycemic episodes due to not eating. He was discharged with a resource guide directed at hypoglycemia.  He presents to the ED today because of persistent headache and light sensitivity since his fall on 11/14. On his examination today, he was noted to have generalized weakness with the PA documenting 3/5 strength in the arms and 4/5 in the legs bilaterally. He was again too lightheaded to stand up.CT scan of the head was obtained and showed a focal area of hypodensity in the right frontal region, interpreted by the radiologist as a possible subacute infarction versus mass lesion. Neurology consultation is now requested for further evaluation.  With regards to his headache, the patient reports that he has a headache that is holocephalic but worst in the right parieto-occipital region. This is associated with photophobia. He denies any sensitivity to sound. He is not endorsing any nausea or vomiting with this headache. Again, this headache is been present since he passed out and fell, striking his head on some cement on 11/14. He has no previous history of migraine headache or other significant headache types. Headaches been persistent for the past 2 days. Regarding his lightheadedness, as long as he is lying flat he feels fine. He seems he starts to sit up or stand up he becomes very lightheaded and feels as if he is going to pass out.  PMH:  Past Medical History:  Diagnosis Date  . Back injuries     PSH:  Past Surgical History:  Procedure Laterality Date  . ORIF WRIST FRACTURE Left 12/15/2012   Procedure: OPEN REDUCTION INTERNAL FIXATION (ORIF) WRIST FRACTURE; Left.  Reduction, repair, and reconnstruction of perilunate fracture dislocation;  Surgeon: Roseanne Kaufman, MD;  Location: [place];  Service: Orthopedics;  Laterality:  Left;    Family history: History reviewed. No pertinent family history.  Social history:  Social History   Social History  . Marital status: Single    Spouse name: N/A  . Number of children: N/A  . Years of education: N/A   Occupational History  . Not on file.   Social History Main Topics  . Smoking status: Unknown If Ever Smoked  . Smokeless tobacco: Never Used  . Alcohol use Yes     Comment: occasional  . Drug use: No  . Sexual activity: Not on file   Other Topics Concern  . Not on file   Social History Narrative  . No narrative on file    Current outpatient meds: No outpatient prescriptions have been marked as taking for the 04/30/16 encounter [facility] Encounter).    Current inpatient meds:  No current facility-administered medications for this encounter.    Current Outpatient Prescriptions  Medication Sig Dispense Refill  . acetaminophen (TYLENOL) 500 MG tablet Take 1 tablet (500 mg total) by mouth every 6 (six) hours as needed. (Patient not taking: Reported on 04/30/2016) 30 tablet 0  . ibuprofen (ADVIL,MOTRIN) 800 MG tablet Take 1 tablet (800 mg total) by mouth 3 (three) times daily as needed. (Patient not taking: Reported on 04/30/2016) 21 tablet 0    Allergies: No Known Allergies  ROS: As per HPI. A full 14-point review of systems was performed and is otherwise notable for some occasional shortness of breath. He has some occasional discomfort in his left wrist related to his previous fracture and orthopedic repair, typically with weather changes. The remainder of his review of systems is unremarkable.  PE:  BP 109/64   Pulse 76   Temp 98.2 F (36.8 C) (Oral)   Resp 14   Ht 6\' 2"  (1.88 m)   Wt (!) 155.6 kg (343 lb)   SpO2 98%   BMI 44.04 kg/m   General:,Obese African-American young man lying on ED gurney. He has the room darkened because of his sensitivity to light. He appears to be in no acute distress. AAO x4. Speech clear, no dysarthria. No  aphasia. Follows commands briskly. Affect is mildly restricted. Comportment is normal. He is photophobic. HEENT: Normocephalic. Neck supple without LAD. MMM, OP clear. Dentition good. Sclerae anicteric. No conjunctival injection.  CV: Distant,regular, no murmur. Carotid pulses full and symmetric, no bruits. Distal pulses 2+ and symmetric.  Lungs: CTAB on anterior examination, though somewhat difficult to auscultate due to body habitus.  Abdomen: Soft, obese, non-distended, non-tender. Bowel sounds present x4.  Extremities: No C/C/E. Neuro:  CN: Pupils are difficult to assess because of his photophobia but appear equal and reactive. Visual fields are full. EOMI without nystagmus. No reported diplopia. Facial sensation is intact to light touch. Face is symmetric at rest with normal strength and mobility. Hearing is intact to conversational voice. Palate elevates symmetrically and uvula is midline. Voice is normal in tone, pitch and quality. Bilateral SCM and trapezii are 5/5. Tongue is midline with normal bulk and mobility.  Motor: Normal bulk, tone. He has mild generalized weakness that seems effort driven as he complains of feeling  too tired to perform tasks.No tremor or other abnormal movements. No drift.  Sensation: Intact to light touch.  DTRs: 2+, symmetric. Toes downgoing bilaterally. No pathologic reflexes.  Coordination: Finger-to-nose and heel-to-shin are without dysmetria. Finger taps are normal in amplitude and speed, no decrement.  Gait: Deferred due to orthostasis.  Labs:  Lab Results  Component Value Date   WBC 7.6 04/30/2016   HGB 15.0 04/30/2016   HCT 44.2 04/30/2016   PLT 243 04/30/2016   GLUCOSE 99 04/30/2016   NA 137 04/30/2016   K 4.4 04/30/2016   CL 107 04/30/2016   CREATININE 1.26 (H) 04/30/2016   BUN 12 04/30/2016   CO2 22 04/30/2016   TSH 1.203 04/30/2016   CK 317 Serum ethanol less than 5 Urine drug screen negative Urinalysis unremarkable  Imaging:  I have  personally and independently reviewed the CT scan of the head without contrast from today.this shows a focal rounded area of hypodensity in the right anterior fossa involving the inferolateral aspect of the right frontal lobe.on some cuts, this appears to be extra-axial and location with displacement of surrounding gyri, though this is difficult to ascertain on this study and it may well be intraparenchymal.it does not have a typical appearance of arterial stroke as it is an unusual location and distribution. It is well demarcated without evidence of significant vasogenic edema. He does not have the appearance of typical chronic encephalomalacia as it has intermediate density. There is some thinning of the skull adjacent to this lesion.  Assessment and Plan:  1. Abnormal head CT: imaging demonstrates an area of hypodensity in the right anterior cranial fossa involving the inferolateral right frontal lobe. This lesion is atypical for an ischemic stroke, which would be unlikely anyway given that the patient is 30 years old with no significant risk factors for cerebrovascular disease. It is somewhat difficult to ascertain whether this lesion is extra or intra-axial and location so differential diagnosis is quite broad.This could represent a mass lesion, with considerations including sarcoidosis, meningioma, craniopharyngioma, a bone-based tumor such as osteoma or osteoblastoma, osteofibrous, or possibly glioma. Less likely considerations could include fungal infection, TB, other granulomatous processes such as Wegener's granulomatosis.MRI scan of the brain with and without contrast was recommended by radiology for further evaluation. I agree with this study and this is pending at this time. MRI will be reviewed upon its completion with further recommendations to follow.  2. Headache: His headache is somewhat nonspecific. It does not clearly fit migraine or any other primary headache type. This may represent  postconcussive headache given that it is only been present since his fall and blow to the head 2 days ago. Recommend symptomatically management with nonsteroidal anti-inflammatories as needed.  3. Orthostatic lightheadedness: Again, this is only been present since his fall. This could also represent postconcussive change. Ensure adequate fluid resuscitation. Headache does not increase with standing per patient, so this does not suggest CSF leak.  4. Generalized weakness: This is quite nonspecific. This is not explained by the finding on CT scan. This seems to be more related to overall fatigue and malaise as well as lightheadedness.  This was discussed with the patient and his family. They are in agreement with the plan as noted. They were given the opportunity to ask any questions and these were addressed to their satisfaction.

## 2016-04-30 NOTE — ED Triage Notes (Signed)
Pt states that on Tuesday he felt tired and just wanted to sleep. His brother took him in car to get some food- when he tried to get out of the car he passed out. Pt denies CP/SOB prior to it. Pt hit head on cement. Pt has had headaches/light sensitivity since the incident. Denies vomiting. Pt states he has felt weak since the syncopal episoide as well.

## 2016-04-30 NOTE — ED Notes (Signed)
Patient transported to MRI 

## 2016-04-30 NOTE — Progress Notes (Signed)
Subjective:    Patient ID: Donald Mayer, male    DOB: Nov 27, 1985, 30 y.o.   MRN: [id number]  HPI  I have reviewed pt PMH, PSH, FH, Social History and Surgical History   Pt works for [employer], no regular exercise. Hx of lumbar fx and ankle injury prevents exercise per pt.  Pt eats one meal a day and not very healthy. No smoke. Rare alcohol.(1 shot of liquor 1-2 times a month)  Pt in states seen in ED. He states his friend was taking him to restaurant. He felt tired that day. He got out of car and passed out. No preceding ha or chest pain.   Since yesterday still feels week and mild sob.Ha today and feel very weak. Can barely stand per his report.  Pt has mild ha presently with some light sensitivity. No hx of migraine.   Pt went to ED and evaluated. Pt had ekg. Showed nsr. CXR was clear as well. Troponin. cmp was done. Cr was mild elevated. CBC was normal.   Pt was not eating for about day and half before syncope event.  Pt does snore very loudly per mom. Pt sleeps 4-5 hours at night. He wakes up a lot at night. No hx of asthma.  Pt feels so weak today he can barely walk.    Review of Systems  Constitutional: Negative for chills, fatigue and fever.  Respiratory: Positive for shortness of breath. Negative for chest tightness and wheezing.        On ambulation.  Hx of snoring.  Cardiovascular: Negative for chest pain and palpitations.  Gastrointestinal: Negative for abdominal distention and abdominal pain.  Genitourinary: Negative for difficulty urinating, flank pain, frequency and penile pain.  Musculoskeletal: Negative for back pain.  Neurological: Positive for dizziness and headaches. Negative for tremors, seizures, syncope, speech difficulty, weakness, light-headedness and numbness.  Hematological: Negative for adenopathy. Does not bruise/bleed easily.  Psychiatric/Behavioral: Positive for sleep disturbance. Negative for behavioral problems and confusion. The  patient is not nervous/anxious.     Past Medical History:  Diagnosis Date  . Back injuries      Social History   Social History  . Marital status: Single    Spouse name: N/A  . Number of children: N/A  . Years of education: N/A   Occupational History  . Not on file.   Social History Main Topics  . Smoking status: Former Research scientist (life sciences)  . Smokeless tobacco: Never Used  . Alcohol use No     Comment: occasional  . Drug use: No  . Sexual activity: Not on file   Other Topics Concern  . Not on file   Social History Narrative  . No narrative on file    Past Surgical History:  Procedure Laterality Date  . ORIF WRIST FRACTURE Left 12/15/2012   Procedure: OPEN REDUCTION INTERNAL FIXATION (ORIF) WRIST FRACTURE; Left.  Reduction, repair, and reconnstruction of perilunate fracture dislocation;  Surgeon: Roseanne Kaufman, MD;  Location: [place] [facility] [place];  Service: Orthopedics;  Laterality: Left;    History reviewed. No pertinent family history.  No Known Allergies  Current Outpatient Prescriptions on File Prior to Visit  Medication Sig Dispense Refill  . acetaminophen (TYLENOL) 500 MG tablet Take 1 tablet (500 mg total) by mouth every 6 (six) hours as needed. 30 tablet 0  . ibuprofen (ADVIL,MOTRIN) 800 MG tablet Take 1 tablet (800 mg total) by mouth 3 (three) times daily as needed. 21 tablet 0   No current facility-administered medications  on file prior to visit.     BP 124/84 (BP Location: Left Arm, Patient Position: Sitting)   Pulse 77   Temp 98.1 F (36.7 C) (Oral)   Ht 6\' 1"  (1.854 m)   Wt (!) 343 lb 9.6 oz (155.9 kg)   SpO2 98%   BMI 45.33 kg/m       Objective:   Physical Exam   General Mental Status- Alert. General Appearance- Not in acute distress.   Skin General: Color- Normal Color. Moisture- Normal Moisture.  Neck Carotid Arteries- Normal color. Moisture- Normal Moisture. No carotid bruits. No JVD.  Chest and Lung Exam Auscultation: Breath  Sounds:-Normal.  Cardiovascular Auscultation:Rythm- Regular. Murmurs & Other Heart Sounds:Auscultation of the heart reveals- No Murmurs.  Abdomen Inspection:-Inspeection Normal. Palpation/Percussion:Note:No mass. Palpation and Percussion of the abdomen reveal- Non Tender, Non Distended + BS, no rebound or guarding.    Neurologic Cranial Nerve exam:- CN III-XII intact(No nystagmus), symmetric smile. Drift Test:- No drift. Finger to Nose:- Normal/Intact Strength:- 3.5/5 equal and symmetric strength both upper and lower extremities. Difficult to ambulate due to weakness.  Lower ext- no pedal edema and no homans signs.  Cranium- rt side parietal area pain on palpation.      Assessment & Plan:  For your history of syncope, head trauma, light sensitivity and profound weakness, I do want you to get ct of the head and if study negative then MRI.   Since insurance authrorization may be issue emergently and you may not fit in closed scanner I do want you to be seen at [facility] main ED.  Other studies I considered today area cbc, cmp, d-dimer, and bnp.   My number is [phone] if delay being seen at main ED.  Follow up with coming Monday or as needed  Explained to mom I will proceed outpt studies or can go to [facility] ED. She wanted to go to [place]. Mom called back later stated they were waiting to be seen. I talked to charge nurse who expidited finding a room and got pt back quicker.

## 2016-04-30 NOTE — ED Notes (Signed)
Patient alert and oriented x 4. Patient had c/o fall and having headache and light sensitivity. Please see patient's MRI results. Family with patient. Patient to have EEG in the AM. No neuro deficits, so no need for swallow screen. Patient ambulatory.

## 2016-04-30 NOTE — Patient Instructions (Addendum)
For your history of syncope, head trauma,  Ha, light sensitivity and profound weakness, I do want you to get ct of the head and if study negative then MRI.   Since insurance authrorization may be issue emergently and you may not fit in closed scanner I do want you to be seen at [facility] main ED.  Other studies I considered today area cbc, cmp, d-dimer, and bnp.   My number is [phone] if delay being seen at main ED.  Follow up with coming Monday or as needed

## 2016-05-01 ENCOUNTER — Inpatient Hospital Stay (HOSPITAL_COMMUNITY)
Admit: 2016-05-01 | Discharge: 2016-05-01 | Disposition: A | Discharge status: Home / Self Care | Payer: Managed Care, Other (non HMO) | Attending: Neurology | Admitting: Neurology

## 2016-05-01 ENCOUNTER — Encounter (HOSPITAL_COMMUNITY): Payer: Self-pay | Admitting: General Practice

## 2016-05-01 DIAGNOSIS — G939 Disorder of brain, unspecified: Secondary | ICD-10-CM

## 2016-05-01 DIAGNOSIS — R55 Syncope and collapse: Secondary | ICD-10-CM

## 2016-05-01 LAB — COMPREHENSIVE METABOLIC PANEL
ALK PHOS: 77 U/L (ref 38–126)
ALT: 18 U/L (ref 17–63)
ANION GAP: 8 (ref 5–15)
AST: 21 U/L (ref 15–41)
Albumin: 3.3 g/dL — ABNORMAL LOW (ref 3.5–5.0)
BUN: 11 mg/dL (ref 6–20)
CALCIUM: 9 mg/dL (ref 8.9–10.3)
CHLORIDE: 105 mmol/L (ref 101–111)
CO2: 25 mmol/L (ref 22–32)
CREATININE: 1.48 mg/dL — AB (ref 0.61–1.24)
GFR calc Af Amer: >60 mL/min (ref >60)
Glucose, Bld: 132 mg/dL — ABNORMAL HIGH (ref 65–99)
Potassium: 3.6 mmol/L (ref 3.5–5.1)
Sodium: 138 mmol/L (ref 135–145)
Total Bilirubin: 0.5 mg/dL (ref 0.3–1.2)
Total Protein: 7 g/dL (ref 6.5–8.1)

## 2016-05-01 LAB — CBC WITH DIFFERENTIAL/PLATELET
BASOS ABS: 0 10*3/uL (ref 0.0–0.1)
Basophils Relative: 0 %
EOS ABS: 0.1 10*3/uL (ref 0.0–0.7)
EOS PCT: 1 %
HCT: 42.9 % (ref 39.0–52.0)
Hemoglobin: 14.4 g/dL (ref 13.0–17.0)
LYMPHS PCT: 24 %
Lymphs Abs: 2.1 10*3/uL (ref 0.7–4.0)
MCH: 28.6 pg (ref 26.0–34.0)
MCHC: 33.6 g/dL (ref 30.0–36.0)
MCV: 85.3 fL (ref 78.0–100.0)
MONO ABS: 0.3 10*3/uL (ref 0.1–1.0)
Monocytes Relative: 4 %
Neutro Abs: 6.4 10*3/uL (ref 1.7–7.7)
Neutrophils Relative %: 71 %
PLATELETS: 257 10*3/uL (ref 150–400)
RBC: 5.03 MIL/uL (ref 4.22–5.81)
RDW: 13.4 % (ref 11.5–15.5)
WBC: 9 10*3/uL (ref 4.0–10.5)

## 2016-05-01 LAB — TROPONIN I: Troponin I: <0.03 ng/mL (ref <0.03)

## 2016-05-01 MED ORDER — ONDANSETRON HCL 4 MG/2ML IJ SOLN
4.0000 mg | Freq: Once | INTRAMUSCULAR | Status: AC | PRN
  Administered 2016-05-02: 4 mg via INTRAVENOUS
  Filled 2016-05-01: qty 2

## 2016-05-01 NOTE — Telephone Encounter (Signed)
Noted. Pt has been admitted.  Received Disability paperwork for him.  Paperwork was forwarded to PCP for review and completion.

## 2016-05-01 NOTE — Consult Note (Signed)
CC:  Chief Complaint  Patient presents with  . Loss of Consciousness    HPI: Donald Mayer is a 30 y.o. male admitted through the ED last night with 2 syncopal episodes this past week. He was normal until this past Tuesday when he was getting a ride to go eat. He says he got out of the car and then doesn't remember what happened until he came to at [facility]. He apparently was told he may have orthostatic hypotension, and when they tried to get him up in the ED he nearly passed out again. He was ultimately discharged home, but he and his mother say that since then he has been very weak generally, and having a very difficult time walking.   He does not report focal weakness, numbness, tingling. No history of any cancers. He is left-handed.  PMH: Past Medical History:  Diagnosis Date  . Back injuries     PSH: Past Surgical History:  Procedure Laterality Date  . ORIF WRIST FRACTURE Left 12/15/2012   Procedure: OPEN REDUCTION INTERNAL FIXATION (ORIF) WRIST FRACTURE; Left.  Reduction, repair, and reconnstruction of perilunate fracture dislocation;  Surgeon: Roseanne Kaufman, MD;  Location: [place];  Service: Orthopedics;  Laterality: Left;    SH: Social History  Substance Use Topics  . Smoking status: Unknown If Ever Smoked  . Smokeless tobacco: Never Used  . Alcohol use Yes     Comment: occasional    MEDS: Prior to Admission medications   Medication Sig Start Date End Date Taking? Authorizing Provider  acetaminophen (TYLENOL) 500 MG tablet Take 1 tablet (500 mg total) by mouth every 6 (six) hours as needed. Patient not taking: Reported on 04/30/2016 05/23/14   Baron Sane, PA-C  ibuprofen (ADVIL,MOTRIN) 800 MG tablet Take 1 tablet (800 mg total) by mouth 3 (three) times daily as needed. Patient not taking: Reported on 04/30/2016 09/23/15   [facility] Ward, PA-C    ALLERGY: No Known Allergies  ROS: ROS  NEUROLOGIC EXAM: Awake, alert, oriented Memory  and concentration grossly intact Speech fluent, appropriate CN grossly intact Motor exam: Upper Extremities Deltoid Bicep Tricep Grip  Right 5/5 5/5 5/5 5/5  Left 5/5 5/5 5/5 5/5   Lower Extremity IP Quad PF DF EHL  Right 5/5 5/5 5/5 5/5 5/5  Left 5/5 5/5 5/5 5/5 5/5   Sensation grossly intact to LT  [facility]: MRI brain reviewed with demonstrates an ~3x4x3cm inferior right frontal mass. There is minimal surrounding edema. The mass appears intra-axial, very hypointense on T1, bright T2. No real enhancement.  IMPRESSION: - 30 y.o. 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 Will need resection of the mass.  PLAN: - Will get MRI brain with stereotactic protocol - Plan on operative resection on Mon afternoon  I have reviewed the imaging findings with the patient and his mother. The differential diagnosis was discussed, and the need for definitive diagnosis by surgical resection was recommended. I did explain to them that for most low-grade tumors the treatment of choice is maximal resection. We discussed the risks of brain surgery including speech difficulty, left-sided numbness/tingling/weakness, paralysis, coma, death, SZ, HCP, bleeding and infection. We also discussed general risks of anesthesia including heart attack, stroke, and blood clots. The expected postoperative course was also reviewed. All their questions were answered and they are willing to proceed as above.

## 2016-05-01 NOTE — Progress Notes (Signed)
[facility] Progress Note  Patient: Donald Mayer [id number]   PCP: Mackie Pai, PA-C DOB: April 08, 1986   DOA: 04/30/2016   DOS: 05/01/2016   Date of Service: the patient was seen and examined on 05/01/2016  Brief hospital course: Pt. With no Significant PMH; admitted on 04/30/2016, with complaint of headache, was found to have intracranial mass. Currently further plan is further workup.  Assessment and Plan: 1. Brain mass Appreciate input from neurology as well as neurosurgery. EEG unremarkable. 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 Patient will get another MRI brain with stereotactic protocol. Operative resection on Monday afternoon.  2. Headache. Currently resolved. Continue to monitor.   Pain management: When necessary Tylenol Activity: no indication of physical therapy Bowel regimen: last BM prior to admission  Diet: regular diet DVT Prophylaxis: mechanical compression device.  Advance goals of care discussion: full code  Family Communication: family was present at bedside, at the time of interview. The pt provided permission to discuss medical plan with the family. Opportunity was given to ask question and all questions were answered satisfactorily.   Disposition:  Discharge to home. Expected discharge date: 05/06/2016, Postoperative recovery  Consultants: Neurology, neurosurgery Procedures: EEG  Antibiotics: Anti-infectives    None        Subjective: Denies any headache, dizziness, lightheadedness, nausea, vomiting  Objective: Physical Exam: Vitals:   05/01/16 0503 05/01/16 1000 05/01/16 1310 05/01/16 1647  BP: 126/72 138/77 137/76 (!) 145/88  Pulse: 83 79 90 90  Resp: 20 18 18 18   Temp: 98 F (36.7 C) 98.2 F (36.8 C) 98.8 F (37.1 C) 98.6 F (37 C)  TempSrc: Oral Oral Oral Oral  SpO2: 98% 100% 96% 98%  Weight:      Height:        Intake/Output Summary (Last 24 hours) at 05/01/16 1833 Last data  filed at 05/01/16 0912  Gross per 24 hour  Intake             1600 ml  Output                0 ml  Net             1600 ml   Filed Weights   04/30/16 1104 04/30/16 2101  Weight: (!) 155.6 kg (343 lb) (!) 155.4 kg (342 lb 8 oz)    General: Alert, Awake and Oriented to Time, Place and Person. Appear in mild distress, affect appropriate Eyes: PERRL, Conjunctiva normal ENT: Oral Mucosa clear moist. Neck: no JVD, no Abnormal Mass Or lumps Cardiovascular: S1 and S2 Present, no Murmur, Respiratory: Bilateral Air entry equal and Decreased, no use of accessory muscle, Clear to Auscultation, no Crackles, no wheezes Abdomen: Bowel Sound present, Soft and no tenderness Skin: no redness, no Rash, no induration Extremities: no Pedal edema, no calf tenderness Neurologic: Grossly no focal neuro deficit. Bilaterally Equal motor strength  Data Reviewed: CBC:  Recent Labs Lab 04/28/16 1648 04/30/16 1106 05/01/16 0302  WBC 8.7 7.6 9.0  NEUTROABS 6.2  --  6.4  HGB 14.4 15.0 14.4  HCT 44.4 44.2 42.9  MCV 85.5 84.7 85.3  PLT 246 243 [id number]   Basic Metabolic Panel:  Recent Labs Lab 04/28/16 1648 04/30/16 1106 05/01/16 0302  NA 135 137 138  K 4.3 4.4 3.6  CL 102 107 105  CO2 25 22 25   GLUCOSE 95 99 132*  BUN 13 12 11   CREATININE 1.32* 1.26* 1.48*  CALCIUM 9.1 9.2 9.0  Liver Function Tests:  Recent Labs Lab 05/01/16 0302  AST 21  ALT 18  ALKPHOS 77  BILITOT 0.5  PROT 7.0  ALBUMIN 3.3*   No results for input(s): LIPASE, AMYLASE in the last 168 hours. No results for input(s): AMMONIA in the last 168 hours. Coagulation Profile: No results for input(s): INR, PROTIME in the last 168 hours. Cardiac Enzymes:  Recent Labs Lab 04/28/16 1648 04/30/16 1335 05/01/16 0302 05/01/16 0851 05/01/16 1422  CKTOTAL  --  317  --   --   --   TROPONINI <0.03  --  <0.03 <0.03 <0.03   BNP (last 3 results) No results for input(s): PROBNP in the last 8760 hours.  CBG:  Recent  Labs Lab 04/30/16 1207  GLUCAP 98    Studies: Mr [name] And Wo Contrast  Result Date: 04/30/2016 CLINICAL DATA:  Generalized weakness, lightheadedness and fatigue. Syncopal episode 2 days ago. EXAM: MRI HEAD WITHOUT AND WITH CONTRAST TECHNIQUE: Multiplanar, multiecho pulse sequences of the brain and surrounding structures were obtained without and with intravenous contrast. CONTRAST:  56mL MULTIHANCE GADOBENATE DIMEGLUMINE 529 MG/ML IV SOLN COMPARISON:  CT HEAD April 30, 2016 at 1257 hours FINDINGS: BRAIN: RIGHT frontal lobe 3.1 x 4.3 x 3.4 cm (transverse by AP by CC) RIGHT inferior frontal lobe low T1, heterogeneously bright T2 mass without enhancement extending to the cortex with local mass effect and surrounding vasogenic edema. Faint central linear susceptibility artifact and consistent with vascularity. T2 shine through without reduced diffusion. No midline shift. Ventricles and sulci are otherwise normal for patient's age. No abnormal extra-axial fluid collections. 8 mm pineal cyst. VASCULAR: Normal major intracranial vascular flow voids present at skull base. SKULL AND UPPER CERVICAL SPINE: No abnormal sellar expansion. No suspicious calvarial bone marrow signal. Craniocervical junction maintained. SINUSES/ORBITS: The mastoid air-cells and included paranasal sinuses are well-aerated. The included ocular globes and orbital contents are non-suspicious. OTHER: None. 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 Local mass effect without midline shift. Acute findings discussed with and reconfirmed by Dr.Wentz on 04/30/2016 at 7:05 pm. Electronically Signed   By: Elon Alas M.D.   On: 04/30/2016 19:01     Scheduled Meds: Continuous Infusions: . sodium chloride 100 mL/hr at 05/01/16 0912   PRN Meds: acetaminophen **OR** acetaminophen, ondansetron **OR**  ondansetron (ZOFRAN) IV, oxyCODONE-acetaminophen  Time spent: 30 minutes  Author: Berle Mull, MD [facility] Pager: [phone] 05/01/2016 6:33 PM  If 7PM-7AM, please contact night-coverage at [email or web address], password [facility]

## 2016-05-01 NOTE — Procedures (Signed)
ELECTROENCEPHALOGRAM REPORT  Date of Study: 05/01/2016  Patient's Name: Donald Mayer MRN: [id number] Date of Birth: 08-04-1985  Referring Provider: Dr. Kerney Elbe  Clinical History: This is a 29 year old man with headache, syncope, found to have a right frontal mass.   Medications: acetaminophen (TYLENOL) tablet 650 mg  oxyCODONE-acetaminophen (PERCOCET/ROXICET) 5-325 MG per tablet 1 tablet   Technical Summary: A multichannel digital EEG recording measured by the international 10-20 system with electrodes applied with paste and impedances below 5000 ohms performed in our laboratory with EKG monitoring in an awake and asleep patient.  Hyperventilation and photic stimulation were performed.  The digital EEG was referentially recorded, reformatted, and digitally filtered in a variety of bipolar and referential montages for optimal display.    Description: The patient is awake and asleep during the recording.  During maximal wakefulness, there is a symmetric, medium voltage 10 Hz posterior dominant rhythm that attenuates with eye opening.  The record is symmetric.  During drowsiness and sleep, there is an increase in theta slowing of the background.  Vertex waves and symmetric sleep spindles were seen.  Hyperventilation and photic stimulation did not elicit any abnormalities.  There was EKG artifact throughout the recording. There were no epileptiform discharges or electrographic seizures seen.    EKG lead was unremarkable.  Impression: This awake and asleep EEG is normal.    Clinical Correlation: A normal EEG does not exclude a clinical diagnosis of epilepsy. Clinical correlation is advised.   Ellouise Newer, M.D.

## 2016-05-01 NOTE — Progress Notes (Addendum)
Subjective: No complaints, other than photophobia which she has had since his initial fall and struck his head.  Exam: Vitals:   05/01/16 0112 05/01/16 0503  BP: 128/77 126/72  Pulse: 90 83  Resp: 18 20  Temp: 98.1 F (36.7 C) 98 F (36.7 C)   Neuro:  CN: Pupils are difficult to assess because of his photophobia but appear equal and reactive. Visual fields are full. EOMI without nystagmus. No reported diplopia. Facial sensation is intact to light touch. Face is symmetric at rest with normal strength and mobility. Hearing is intact to conversational voice. Palate elevates symmetrically and uvula is midline. Voice is normal in tone, pitch and quality. Bilateral SCM and trapezii are 5/5. Tongue is midline with normal bulk and mobility.  Motor: Normal bulk, tone. 5/5 throughout. No tremor or other abnormal movements. No drift.  Sensation: Intact to light touch.  DTRs: 2+, symmetric. Toes downgoing bilaterally. No pathologic reflexes.  Coordination: Finger-to-nose and heel-to-shin are without dysmetria. Finger taps are normal in amplitude and speed, no decrement.  Gait: Deferred due to orthostasis.    Pertinent Labs/Diagnostics: FINDINGS of MRI  brain BRAIN: RIGHT frontal lobe 3.1 x 4.3 x 3.4 cm (transverse by AP by CC) RIGHT inferior frontal lobe low T1, heterogeneously bright T2 mass without enhancement extending to the cortex with local mass effect and surrounding vasogenic edema. Faint central linear susceptibility artifact and consistent with vascularity. T2 shine through without reduced diffusion. No midline shift. Ventricles and sulci are otherwise normal for patient's age. No abnormal extra-axial fluid collections. 8 mm pineal cyst.  VASCULAR: Normal major intracranial vascular flow voids present at skull base.  SKULL AND UPPER CERVICAL SPINE: No abnormal sellar expansion. No suspicious calvarial bone marrow signal. Craniocervical junction maintained.  SINUSES/ORBITS:  The mastoid air-cells and included paranasal sinuses are well-aerated. The included ocular globes and orbital contents are non-suspicious.  OTHER: None.  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 Local mass effect without midline shift.  Etta Quill PA-C [facility] [phone]  Impression: This is a 30 year old male presenting with multiple episodes of syncope and near syncopal events. No seizure activity was noted during these events. Patient followed up for a CT of his head which showed a lesion in the right frontal lobe. Follow-up MRI with contrast again showed a 3.1 x 4.3 x 3.47 the right frontal lobe nonenhancing mass with characteristics of a primary brain tumor. Patient has had no seizure activity while in the hospital. Patient does not complain of headache today however he still has photophobia secondary to what is most likely postconcussive.   Recommendations: 1) neurosurgical evaluation for biopsy of right frontal lobe mass 2) EEG pending  We will continue to follow along with EEG results.  05/01/2016, 10:31 AM    NEUROLOGY ATTENDING ADDENDUM Agree with PA note. 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 EEG normal, nothing to suggest seizure. Neurosurgery consult pending for recs regarding mass. Suspect HA, photophobia, and lightheadedness may be postconcussive in nature as previously noted. Treatment is symptomatic, avoiding opiates and anticholinergic agents as these can impair neurologic recovery from concussion and exacerbate symptoms.  No further recs. Will sign off. Call if any new issues arise.

## 2016-05-01 NOTE — Progress Notes (Signed)
EEG completed; results pending.    

## 2016-05-02 ENCOUNTER — Inpatient Hospital Stay (HOSPITAL_COMMUNITY): Payer: Managed Care, Other (non HMO)

## 2016-05-02 NOTE — Progress Notes (Signed)
[facility] Progress Note  Patient: Donald Mayer [id number]   PCP: Mackie Pai, PA-C DOB: 10/19/1985   DOA: 04/30/2016   DOS: 05/02/2016   Date of Service: the patient was seen and examined on 05/02/2016  Brief hospital course: Pt. With no Significant PMH; admitted on 04/30/2016, with complaint of headache, was found to have intracranial mass. Currently further plan is further workup.  Assessment and Plan: 1. Right-sided brain mass Appreciate input from neurology as well as neurosurgery. EEG unremarkable. 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 Patient will get another MRI brain with stereotactic protocol. Operative resection on Monday afternoon.  2. Headache. Currently resolved. Continue to monitor.   Pain management: When necessary Tylenol Activity: no indication of physical therapy Bowel regimen: last BM prior to admission  Diet: regular diet DVT Prophylaxis: mechanical compression device.  Advance goals of care discussion: full code  Family Communication: no family was present at bedside, at the time of interview.   Disposition:  Discharge to home. Expected discharge date: 05/06/2016, Postoperative recovery  Consultants: Neurology, neurosurgery Procedures: EEG  Antibiotics: Anti-infectives    None        Subjective: Denies any headache, dizziness, lightheadedness, nausea, vomiting  Objective: Physical Exam: Vitals:   05/02/16 0200 05/02/16 0556 05/02/16 0858 05/02/16 1409  BP: 130/71 125/64 (!) 145/78 128/75  Pulse: 77 67    Resp: 20 20 20 20   Temp: 98.2 F (36.8 C) 97.8 F (36.6 C) 98 F (36.7 C) 97.9 F (36.6 C)  TempSrc: Oral Oral Oral Oral  SpO2: 98% 98% 96% 98%  Weight:      Height:       No intake or output data in the 24 hours ending 05/02/16 1432 Filed Weights   04/30/16 1104 04/30/16 2101  Weight: (!) 155.6 kg (343 lb) (!) 155.4 kg (342 lb 8 oz)    General: Alert, Awake and Oriented to  Time, Place and Person. Appear in mild distress, affect appropriate Cardiovascular: S1 and S2 Present, no Murmur, Respiratory: Bilateral Air entry equal and Decreased, no use of accessory muscle, Clear to Auscultation, no Crackles, no wheezes Abdomen: Bowel Sound present Extremities: no Pedal edema, no calf tenderness Neurologic: Grossly no focal neuro deficit. Bilaterally Equal motor strength  Data Reviewed: CBC:  Recent Labs Lab 04/28/16 1648 04/30/16 1106 05/01/16 0302  WBC 8.7 7.6 9.0  NEUTROABS 6.2  --  6.4  HGB 14.4 15.0 14.4  HCT 44.4 44.2 42.9  MCV 85.5 84.7 85.3  PLT 246 243 [id number]   Basic Metabolic Panel:  Recent Labs Lab 04/28/16 1648 04/30/16 1106 05/01/16 0302  NA 135 137 138  K 4.3 4.4 3.6  CL 102 107 105  CO2 25 22 25   GLUCOSE 95 99 132*  BUN 13 12 11   CREATININE 1.32* 1.26* 1.48*  CALCIUM 9.1 9.2 9.0    Liver Function Tests:  Recent Labs Lab 05/01/16 0302  AST 21  ALT 18  ALKPHOS 77  BILITOT 0.5  PROT 7.0  ALBUMIN 3.3*   No results for input(s): LIPASE, AMYLASE in the last 168 hours. No results for input(s): AMMONIA in the last 168 hours. Coagulation Profile: No results for input(s): INR, PROTIME in the last 168 hours. Cardiac Enzymes:  Recent Labs Lab 04/28/16 1648 04/30/16 1335 05/01/16 0302 05/01/16 0851 05/01/16 1422  CKTOTAL  --  317  --   --   --   TROPONINI <0.03  --  <0.03 <0.03 <0.03   BNP (last 3 results)  No results for input(s): PROBNP in the last 8760 hours.  CBG:  Recent Labs Lab 04/30/16 1207  GLUCAP 98    Studies: No results found.   Scheduled Meds: Continuous Infusions:  PRN Meds: acetaminophen **OR** acetaminophen, ondansetron **OR** ondansetron (ZOFRAN) IV, ondansetron (ZOFRAN) IV, oxyCODONE-acetaminophen  Time spent: 30 minutes  Author: Berle Mull, MD [facility] Pager: [phone] 05/02/2016 2:32 PM  If 7PM-7AM, please contact night-coverage at [email or web address], password [facility]

## 2016-05-02 NOTE — Progress Notes (Signed)
Patient ID: Donald Mayer, male   DOB: May 12, 1986, 30 y.o.   MRN: [id number] Subjective:  The patient is alert and pleasant. He has no complaints.  Objective: Vital signs in last 24 hours: Temp:  [97.8 F (36.6 C)-98.8 F (37.1 C)] 97.8 F (36.6 C) (11/18 0556) Pulse Rate:  [67-90] 67 (11/18 0556) Resp:  [18-22] 20 (11/18 0556) BP: (125-145)/(64-88) 125/64 (11/18 0556) SpO2:  [96 %-100 %] 98 % (11/18 0556)  Intake/Output from previous day: 11/17 0701 - 11/18 0700 In: 1600 [P.O.:600; I.V.:1000] Out: -  Intake/Output this shift: No intake/output data recorded.  Physical exam the patient is alert and oriented. His speech is normal. He is moving all 4 extremities well.  Lab Results:  Recent Labs  04/30/16 1106 05/01/16 0302  WBC 7.6 9.0  HGB 15.0 14.4  HCT 44.2 42.9  PLT 243 257   BMET  Recent Labs  04/30/16 1106 05/01/16 0302  NA 137 138  K 4.4 3.6  CL 107 105  CO2 22 25  GLUCOSE 99 132*  BUN 12 11  CREATININE 1.26* 1.48*  CALCIUM 9.2 9.0    Studies/Results: Ct Head Wo Contrast  Result Date: 04/30/2016 CLINICAL DATA:  [place] 3 days ago and hit head. Complaining of weakness. EXAM: CT HEAD WITHOUT CONTRAST TECHNIQUE: Contiguous axial images were obtained from the base of the skull through the vertex without intravenous contrast. COMPARISON:  None. FINDINGS: Brain: Area of low attenuation in the frontotemporal area on the right side of uncertain etiology. It does not have the typical appearance of an old infarct. A subacute infarct is possible. There is no encephalomalacia or ex vacuo dilatation of the frontal horn of the right lateral ventricle. There is no mass effect on the adjacent brain parenchyma. It does not have the appearance of an arachnoid cyst. Neoplasm needs to be excluded. Recommend MRI brain without and with contrast for further evaluation. No intracranial hemorrhage or extra-axial fluid collections. The brainstem and cerebellum are unremarkable.  Vascular: No vascular calcifications for hyperdense vessels. Skull: There is thinning of the calvarium in the region of the right frontal lesion. No bone lesion or fracture. Sinuses/Orbits: The paranasal sinuses and mastoid air cells are clear. The globes are intact. Other: No scalp lesion nodes or hematoma. IMPRESSION: Indeterminate area of low attenuation in the right frontotemporal area. Subacute infarct versus neoplasm. Recommend MRI brain without and with contrast for further evaluation. These results were called by telephone at the time of interpretation on 04/30/2016 at 1:33 pm to Dr. Merrily Pew , who verbally acknowledged these results. Electronically Signed   By: Marijo Sanes M.D.   On: 04/30/2016 13:33   Mr Donald Mayer And Wo Contrast  Result Date: 04/30/2016 CLINICAL DATA:  Generalized weakness, lightheadedness and fatigue. Syncopal episode 2 days ago. EXAM: MRI HEAD WITHOUT AND WITH CONTRAST TECHNIQUE: Multiplanar, multiecho pulse sequences of the brain and surrounding structures were obtained without and with intravenous contrast. CONTRAST:  96mL MULTIHANCE GADOBENATE DIMEGLUMINE 529 MG/ML IV SOLN COMPARISON:  CT HEAD April 30, 2016 at 1257 hours FINDINGS: BRAIN: RIGHT frontal lobe 3.1 x 4.3 x 3.4 cm (transverse by AP by CC) RIGHT inferior frontal lobe low T1, heterogeneously bright T2 mass without enhancement extending to the cortex with local mass effect and surrounding vasogenic edema. Faint central linear susceptibility artifact and consistent with vascularity. T2 shine through without reduced diffusion. No midline shift. Ventricles and sulci are otherwise normal for patient's age. No abnormal extra-axial fluid collections. 8 mm pineal  cyst. VASCULAR: Normal major intracranial vascular flow voids present at skull base. SKULL AND UPPER CERVICAL SPINE: No abnormal sellar expansion. No suspicious calvarial bone marrow signal. Craniocervical junction maintained. SINUSES/ORBITS: The mastoid  air-cells and included paranasal sinuses are well-aerated. The included ocular globes and orbital contents are non-suspicious. OTHER: None. 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 Local mass effect without midline shift. Acute findings discussed with and reconfirmed by Dr.Wentz on 04/30/2016 at 7:05 pm. Electronically Signed   By: Elon Alas M.D.   On: 04/30/2016 19:01    Assessment/Plan: Brain tumor: The patient is scheduled for surgery on Monday.  LOS: 2 days     Ryanna Teschner D 05/02/2016, 7:09 AM

## 2016-05-03 LAB — SURGICAL PCR SCREEN
MRSA, PCR: NEGATIVE
STAPHYLOCOCCUS AUREUS: NEGATIVE

## 2016-05-03 MED ORDER — GADOBENATE DIMEGLUMINE 529 MG/ML IV SOLN
20.0000 mL | Freq: Once | INTRAVENOUS | Status: AC | PRN
  Administered 2016-05-03: 20 mL via INTRAVENOUS

## 2016-05-03 MED ORDER — BUTALBITAL-APAP-CAFFEINE 50-325-40 MG PO TABS
1.0000 | ORAL_TABLET | Freq: Four times a day (QID) | ORAL | Status: DC | PRN
  Administered 2016-05-08: 1 via ORAL
  Filled 2016-05-03 (×2): qty 1

## 2016-05-03 NOTE — Progress Notes (Signed)
[facility] Progress Note  Patient: MARICE VACLAVIK [id number]   PCP: Mackie Pai, PA-C DOB: [birth year]   DOA: 04/30/2016   DOS: 05/03/2016   Date of Service: the patient was seen and examined on 05/03/2016  Brief hospital course: Pt. With no Significant PMH; admitted on 04/30/2016, with complaint of headache, was found to have intracranial mass. Currently further plan is further workup.  Assessment and Plan: 1. Right-sided brain mass Appreciate input from neurology as well as neurosurgery. EEG unremarkable. 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 Repeat MRI shows cortically based right frontal lobe tumor, nonenhancing with minimal surrounding edema. Operative resection on Monday afternoon.  2. Headache. When necessary Tylenol and Fioricet Continue to monitor.   Pain management: When necessary Tylenol Activity: no indication of physical therapy Bowel regimen: last BM prior to admission  Diet: regular diet, nothing by mouth after midnight DVT Prophylaxis: mechanical compression device.  Advance goals of care discussion: full code  Family Communication: family was present at bedside, at the time of interview.   Disposition:  Discharge to home. Expected discharge date: 05/06/2016, Postoperative recovery  Consultants: Neurology, neurosurgery Procedures: EEG  Antibiotics: Anti-infectives    None      Subjective: Complains about reoccurrence of headache and requesting something other than Percocet  Objective: Physical Exam: Vitals:   05/03/16 0650 05/03/16 0949 05/03/16 1449 05/03/16 1721  BP: 121/71 (!) 117/49 131/72 122/63  Pulse: 77 87 78 87  Resp: 18 19 20 18   Temp: 98.1 F (36.7 C) 98.3 F (36.8 C) 98.9 F (37.2 C) 98.2 F (36.8 C)  TempSrc: Oral Oral Oral Oral  SpO2: 96%   96%  Weight:      Height:        Intake/Output Summary (Last 24 hours) at 05/03/16 1739 Last data filed at 05/03/16 0105  Gross per 24  hour  Intake                0 ml  Output              800 ml  Net             -800 ml   Filed Weights   04/30/16 1104 04/30/16 2101  Weight: (!) 155.6 kg (343 lb) (!) 155.4 kg (342 lb 8 oz)    General: Alert, Awake and Oriented to Time, Place and Person. Appear in mild distress, affect appropriate Cardiovascular: S1 and S2 Present, no Murmur, Respiratory: Bilateral Air entry equal and Decreased, no use of accessory muscle, Clear to Auscultation, no Crackles, no wheezes Abdomen: Bowel Sound present Extremities: no Pedal edema, no calf tenderness Neurologic: Grossly no focal neuro deficit. Bilaterally Equal motor strength  Data Reviewed: CBC:  Recent Labs Lab 04/28/16 1648 04/30/16 1106 05/01/16 0302  WBC 8.7 7.6 9.0  NEUTROABS 6.2  --  6.4  HGB 14.4 15.0 14.4  HCT 44.4 44.2 42.9  MCV 85.5 84.7 85.3  PLT 246 243 [id number]   Basic Metabolic Panel:  Recent Labs Lab 04/28/16 1648 04/30/16 1106 05/01/16 0302  NA 135 137 138  K 4.3 4.4 3.6  CL 102 107 105  CO2 25 22 25   GLUCOSE 95 99 132*  BUN 13 12 11   CREATININE 1.32* 1.26* 1.48*  CALCIUM 9.1 9.2 9.0    Liver Function Tests:  Recent Labs Lab 05/01/16 0302  AST 21  ALT 18  ALKPHOS 77  BILITOT 0.5  PROT 7.0  ALBUMIN 3.3*   No results for input(s):  LIPASE, AMYLASE in the last 168 hours. No results for input(s): AMMONIA in the last 168 hours. Coagulation Profile: No results for input(s): INR, PROTIME in the last 168 hours. Cardiac Enzymes:  Recent Labs Lab 04/28/16 1648 04/30/16 1335 05/01/16 0302 05/01/16 0851 05/01/16 1422  CKTOTAL  --  317  --   --   --   TROPONINI <0.03  --  <0.03 <0.03 <0.03   BNP (last 3 results) No results for input(s): PROBNP in the last 8760 hours.  CBG:  Recent Labs Lab 04/30/16 1207  GLUCAP 98    Studies: Mr [name] Wo Contrast  Result Date: 05/03/2016 CLINICAL DATA:  Brain tumor EXAM: MRI HEAD WITHOUT AND WITH CONTRAST TECHNIQUE: Multiplanar, multiecho pulse  sequences of the brain and surrounding structures were obtained without and with intravenous contrast. CONTRAST:  73mL MULTIHANCE GADOBENATE DIMEGLUMINE 529 MG/ML IV SOLN COMPARISON:  Brain MRI 04/30/2016 FINDINGS: Brain: No acute infarct or intraparenchymal hemorrhage. There is a nonenhancing, low T1/ high T2 weighted signal lesion within the right frontal lobe, arising from the right frontal cortex and measuring 3.6 x 4.4 cm, unchanged. Minimal mass effect on the right lateral ventricle is unchanged. No significant midline shift. 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 No hydrocephalus or extra-axial fluid collection. No age advanced or lobar predominant atrophy. Vascular: Major intracranial arterial and venous sinus flow voids are preserved. No evidence of chronic microhemorrhage or amyloid angiopathy. Skull and upper cervical spine: The visualized skull base, calvarium, upper cervical spine and extracranial soft tissues are normal. Sinuses/Orbits: No fluid levels or advanced mucosal thickening. No mastoid effusion. Normal orbits. IMPRESSION: 1. Non enhancing, cortically based right frontal lobe tumor with minimal surrounding edema. Primary differential considerations are low grade astrocytoma, oligodendroglioma or dysembryoplastic neuroepithelial tumor (DNET). 2. Unchanged minimal local mass effect. Electronically Signed   By: Ulyses Jarred M.D.   On: 05/03/2016 01:35     Scheduled Meds: Continuous Infusions:  PRN Meds: acetaminophen **OR** acetaminophen, butalbital-acetaminophen-caffeine, ondansetron **OR** ondansetron (ZOFRAN) IV  Time spent: 30 minutes  Author: Berle Mull, MD [facility] Pager: [phone] 05/03/2016 5:39 PM  If 7PM-7AM, please contact night-coverage at [email or web address], password [facility]

## 2016-05-03 NOTE — Progress Notes (Signed)
No acute events Exam stable Ready for OR tomorrow

## 2016-05-04 ENCOUNTER — Inpatient Hospital Stay (HOSPITAL_COMMUNITY): Payer: Managed Care, Other (non HMO) | Admitting: Anesthesiology

## 2016-05-04 ENCOUNTER — Inpatient Hospital Stay (HOSPITAL_COMMUNITY): Payer: Managed Care, Other (non HMO)

## 2016-05-04 ENCOUNTER — Encounter (HOSPITAL_COMMUNITY)
Admission: EM | Disposition: A | Discharge status: Home Health | Payer: Self-pay | Source: Home / Self Care | Attending: Internal Medicine

## 2016-05-04 ENCOUNTER — Encounter (HOSPITAL_COMMUNITY): Payer: Self-pay | Admitting: Certified Registered"

## 2016-05-04 HISTORY — PX: APPLICATION OF CRANIAL NAVIGATION: SHX6578

## 2016-05-04 HISTORY — PX: STERIOTACTIC STIMULATOR INSERTION: SHX5374

## 2016-05-04 LAB — BASIC METABOLIC PANEL
Anion gap: 7 (ref 5–15)
BUN: 13 mg/dL (ref 6–20)
CALCIUM: 8.9 mg/dL (ref 8.9–10.3)
CO2: 24 mmol/L (ref 22–32)
CREATININE: 1.29 mg/dL — AB (ref 0.61–1.24)
Chloride: 107 mmol/L (ref 101–111)
GFR calc non Af Amer: >60 mL/min (ref >60)
Glucose, Bld: 89 mg/dL (ref 65–99)
Potassium: 4 mmol/L (ref 3.5–5.1)
SODIUM: 138 mmol/L (ref 135–145)

## 2016-05-04 LAB — CBC
HCT: 40.6 % (ref 39.0–52.0)
Hemoglobin: 13.1 g/dL (ref 13.0–17.0)
MCH: 27.9 pg (ref 26.0–34.0)
MCHC: 32.3 g/dL (ref 30.0–36.0)
MCV: 86.4 fL (ref 78.0–100.0)
Platelets: 235 10*3/uL (ref 150–400)
RBC: 4.7 MIL/uL (ref 4.22–5.81)
RDW: 13.3 % (ref 11.5–15.5)
WBC: 7.4 10*3/uL (ref 4.0–10.5)

## 2016-05-04 SURGERY — STERIOTACTIC BIOPSY
Anesthesia: General | Site: Head | Laterality: Right

## 2016-05-04 MED ORDER — 0.9 % SODIUM CHLORIDE (POUR BTL) OPTIME
TOPICAL | Status: DC | PRN
  Administered 2016-05-04 (×3): 1000 mL

## 2016-05-04 MED ORDER — CEFAZOLIN SODIUM 1 G IJ SOLR
INTRAMUSCULAR | Status: AC
  Filled 2016-05-04: qty 30

## 2016-05-04 MED ORDER — LABETALOL HCL 5 MG/ML IV SOLN: 10.0000 mg | INTRAVENOUS | Status: DC | PRN

## 2016-05-04 MED ORDER — GELATIN ABSORBABLE MT POWD
OROMUCOSAL | Status: DC | PRN
  Administered 2016-05-04: 15:00:00 via TOPICAL
  Administered 2016-05-04: 5 mL via TOPICAL

## 2016-05-04 MED ORDER — MIDAZOLAM HCL 2 MG/2ML IJ SOLN
INTRAMUSCULAR | Status: DC | PRN
  Administered 2016-05-04: 2 mg via INTRAVENOUS

## 2016-05-04 MED ORDER — PROPOFOL 10 MG/ML IV BOLUS
INTRAVENOUS | Status: DC | PRN
  Administered 2016-05-04: 50 mg via INTRAVENOUS
  Administered 2016-05-04: 100 mg via INTRAVENOUS
  Administered 2016-05-04: 150 mg via INTRAVENOUS
  Administered 2016-05-04 (×2): 50 mg via INTRAVENOUS

## 2016-05-04 MED ORDER — MIDAZOLAM HCL 2 MG/2ML IJ SOLN
INTRAMUSCULAR | Status: AC
  Filled 2016-05-04: qty 2

## 2016-05-04 MED ORDER — THROMBIN 20000 UNITS EX SOLR
CUTANEOUS | Status: AC
  Filled 2016-05-04: qty 20000

## 2016-05-04 MED ORDER — HEMOSTATIC AGENTS (NO CHARGE) OPTIME
TOPICAL | Status: DC | PRN
  Administered 2016-05-04: 1 via TOPICAL

## 2016-05-04 MED ORDER — PROPOFOL 10 MG/ML IV BOLUS
INTRAVENOUS | Status: AC
  Filled 2016-05-04: qty 20

## 2016-05-04 MED ORDER — CEFAZOLIN SODIUM 1 G IJ SOLR
INTRAMUSCULAR | Status: DC | PRN
Start: 2016-05-04 — End: 2016-05-04
  Administered 2016-05-04: 3 g via INTRAMUSCULAR

## 2016-05-04 MED ORDER — LIDOCAINE HCL (CARDIAC) 20 MG/ML IV SOLN
INTRAVENOUS | Status: DC | PRN
  Administered 2016-05-04: 100 mg via INTRAVENOUS

## 2016-05-04 MED ORDER — FENTANYL CITRATE (PF) 100 MCG/2ML IJ SOLN
INTRAMUSCULAR | Status: AC
  Filled 2016-05-04: qty 2

## 2016-05-04 MED ORDER — CEFAZOLIN SODIUM-DEXTROSE 2-4 GM/100ML-% IV SOLN
2.0000 g | Freq: Three times a day (TID) | INTRAVENOUS | Status: AC
  Administered 2016-05-04 – 2016-05-05 (×2): 2 g via INTRAVENOUS
  Filled 2016-05-04 (×2): qty 100

## 2016-05-04 MED ORDER — LACTATED RINGERS IV SOLN: INTRAVENOUS | Status: DC | PRN

## 2016-05-04 MED ORDER — MIDAZOLAM HCL 2 MG/2ML IJ SOLN
1.0000 mg | Freq: Once | INTRAMUSCULAR | Status: AC
  Administered 2016-05-04: 1 mg via INTRAVENOUS
  Filled 2016-05-04: qty 2

## 2016-05-04 MED ORDER — THROMBIN 5000 UNITS EX SOLR
CUTANEOUS | Status: AC
  Filled 2016-05-04: qty 5000

## 2016-05-04 MED ORDER — BACITRACIN ZINC 500 UNIT/GM EX OINT
TOPICAL_OINTMENT | CUTANEOUS | Status: AC
  Filled 2016-05-04: qty 28.35

## 2016-05-04 MED ORDER — SODIUM CHLORIDE 0.9 % IR SOLN
Status: DC | PRN
  Administered 2016-05-04: 15:00:00

## 2016-05-04 MED ORDER — ONDANSETRON HCL 4 MG/2ML IJ SOLN
INTRAMUSCULAR | Status: AC
  Filled 2016-05-04: qty 2

## 2016-05-04 MED ORDER — ROCURONIUM BROMIDE 10 MG/ML (PF) SYRINGE
PREFILLED_SYRINGE | INTRAVENOUS | Status: AC
  Filled 2016-05-04: qty 10

## 2016-05-04 MED ORDER — HYDROCODONE-ACETAMINOPHEN 5-325 MG PO TABS
1.0000 | ORAL_TABLET | ORAL | Status: DC | PRN
  Administered 2016-05-05 – 2016-05-09 (×4): 1 via ORAL
  Filled 2016-05-04 (×4): qty 1

## 2016-05-04 MED ORDER — DOCUSATE SODIUM 100 MG PO CAPS
100.0000 mg | ORAL_CAPSULE | Freq: Two times a day (BID) | ORAL | Status: DC
  Administered 2016-05-04 – 2016-05-10 (×12): 100 mg via ORAL
  Filled 2016-05-04 (×12): qty 1

## 2016-05-04 MED ORDER — SUGAMMADEX SODIUM 500 MG/5ML IV SOLN
INTRAVENOUS | Status: DC | PRN
  Administered 2016-05-04: 310 mg via INTRAVENOUS

## 2016-05-04 MED ORDER — FENTANYL CITRATE (PF) 100 MCG/2ML IJ SOLN
INTRAMUSCULAR | Status: DC | PRN
  Administered 2016-05-04: 100 ug via INTRAVENOUS
  Administered 2016-05-04 (×2): 50 ug via INTRAVENOUS
  Administered 2016-05-04: 150 ug via INTRAVENOUS
  Administered 2016-05-04 (×2): 50 ug via INTRAVENOUS

## 2016-05-04 MED ORDER — DEXAMETHASONE SODIUM PHOSPHATE 10 MG/ML IJ SOLN
INTRAMUSCULAR | Status: AC
  Filled 2016-05-04: qty 1

## 2016-05-04 MED ORDER — DEXAMETHASONE SODIUM PHOSPHATE 4 MG/ML IJ SOLN
4.0000 mg | Freq: Four times a day (QID) | INTRAMUSCULAR | Status: AC
  Administered 2016-05-05 – 2016-05-06 (×4): 4 mg via INTRAVENOUS
  Filled 2016-05-04 (×4): qty 1

## 2016-05-04 MED ORDER — FENTANYL CITRATE (PF) 100 MCG/2ML IJ SOLN
INTRAMUSCULAR | Status: AC
  Filled 2016-05-04: qty 4

## 2016-05-04 MED ORDER — LIDOCAINE HCL (PF) 1 % IJ SOLN
INTRAMUSCULAR | Status: AC
  Filled 2016-05-04: qty 30

## 2016-05-04 MED ORDER — BUPIVACAINE HCL (PF) 0.5 % IJ SOLN
INTRAMUSCULAR | Status: DC | PRN
  Administered 2016-05-04: 15 mL

## 2016-05-04 MED ORDER — SODIUM CHLORIDE 0.9 % IV SOLN
INTRAVENOUS | Status: DC
  Administered 2016-05-04 – 2016-05-06 (×3): via INTRAVENOUS

## 2016-05-04 MED ORDER — PROMETHAZINE HCL 25 MG PO TABS: 12.5000 mg | ORAL_TABLET | ORAL | Status: DC | PRN

## 2016-05-04 MED ORDER — LIDOCAINE HCL (PF) 1 % IJ SOLN
INTRAMUSCULAR | Status: DC | PRN
  Administered 2016-05-04: 15 mL

## 2016-05-04 MED ORDER — DEXAMETHASONE SODIUM PHOSPHATE 4 MG/ML IJ SOLN
4.0000 mg | Freq: Three times a day (TID) | INTRAMUSCULAR | Status: DC
  Administered 2016-05-06 – 2016-05-09 (×8): 4 mg via INTRAVENOUS
  Filled 2016-05-04 (×8): qty 1

## 2016-05-04 MED ORDER — LEVETIRACETAM 500 MG/5ML IV SOLN
500.0000 mg | Freq: Two times a day (BID) | INTRAVENOUS | Status: DC
  Administered 2016-05-05 – 2016-05-06 (×3): 500 mg via INTRAVENOUS
  Filled 2016-05-04 (×4): qty 5

## 2016-05-04 MED ORDER — LIDOCAINE 2% (20 MG/ML) 5 ML SYRINGE
INTRAMUSCULAR | Status: AC
  Filled 2016-05-04: qty 5

## 2016-05-04 MED ORDER — THROMBIN 20000 UNITS EX SOLR
CUTANEOUS | Status: DC | PRN
  Administered 2016-05-04: 15:00:00 via TOPICAL

## 2016-05-04 MED ORDER — SUGAMMADEX SODIUM 500 MG/5ML IV SOLN
INTRAVENOUS | Status: AC
  Filled 2016-05-04: qty 5

## 2016-05-04 MED ORDER — SODIUM CHLORIDE 0.9 % IV SOLN
INTRAVENOUS | Status: DC | PRN
  Administered 2016-05-04 (×2): via INTRAVENOUS

## 2016-05-04 MED ORDER — ROCURONIUM BROMIDE 100 MG/10ML IV SOLN
INTRAVENOUS | Status: DC | PRN
  Administered 2016-05-04 (×2): 30 mg via INTRAVENOUS
  Administered 2016-05-04: 10 mg via INTRAVENOUS
  Administered 2016-05-04: 70 mg via INTRAVENOUS
  Administered 2016-05-04: 10 mg via INTRAVENOUS

## 2016-05-04 MED ORDER — ONDANSETRON HCL 4 MG/2ML IJ SOLN
INTRAMUSCULAR | Status: DC | PRN
  Administered 2016-05-04: 4 mg via INTRAVENOUS

## 2016-05-04 MED ORDER — DEXAMETHASONE SODIUM PHOSPHATE 10 MG/ML IJ SOLN
INTRAMUSCULAR | Status: DC | PRN
  Administered 2016-05-04: 10 mg via INTRAVENOUS

## 2016-05-04 MED ORDER — BACITRACIN ZINC 500 UNIT/GM EX OINT
TOPICAL_OINTMENT | CUTANEOUS | Status: DC | PRN
  Administered 2016-05-04: 1 via TOPICAL

## 2016-05-04 MED ORDER — SODIUM CHLORIDE 0.9 % IR SOLN
Status: DC | PRN
  Administered 2016-05-04: 1000 mL

## 2016-05-04 MED ORDER — LACTATED RINGERS IV SOLN
INTRAVENOUS | Status: DC | PRN
  Administered 2016-05-04: 14:00:00 via INTRAVENOUS

## 2016-05-04 MED ORDER — DEXAMETHASONE SODIUM PHOSPHATE 10 MG/ML IJ SOLN
6.0000 mg | Freq: Four times a day (QID) | INTRAMUSCULAR | Status: AC
  Administered 2016-05-04 – 2016-05-05 (×4): 6 mg via INTRAVENOUS
  Filled 2016-05-04 (×4): qty 1

## 2016-05-04 MED ORDER — SODIUM CHLORIDE 0.9 % IV SOLN
1000.0000 mg | INTRAVENOUS | Status: AC
  Administered 2016-05-04: 1000 mg via INTRAVENOUS
  Filled 2016-05-04: qty 10

## 2016-05-04 MED ORDER — SENNA 8.6 MG PO TABS
1.0000 | ORAL_TABLET | Freq: Two times a day (BID) | ORAL | Status: DC
  Administered 2016-05-04 – 2016-05-10 (×12): 8.6 mg via ORAL
  Filled 2016-05-04 (×12): qty 1

## 2016-05-04 MED ORDER — PANTOPRAZOLE SODIUM 40 MG IV SOLR
40.0000 mg | Freq: Every day | INTRAVENOUS | Status: DC
  Administered 2016-05-04 – 2016-05-05 (×2): 40 mg via INTRAVENOUS
  Filled 2016-05-04: qty 40

## 2016-05-04 SURGICAL SUPPLY — 73 items
BANDAGE ADH SHEER 1  50/CT (GAUZE/BANDAGES/DRESSINGS) IMPLANT
BANDAGE GAUZE 4  KLING STR (GAUZE/BANDAGES/DRESSINGS) ×8 IMPLANT
BATTERY IQ STERILE (MISCELLANEOUS) ×4 IMPLANT
BLADE CLIPPER SURG (BLADE) ×4 IMPLANT
BNDG GAUZE ELAST 4 BULKY (GAUZE/BANDAGES/DRESSINGS) ×8 IMPLANT
BUR ACORN 6.0 PRECISION (BURR) ×3 IMPLANT
BUR ACORN 6.0MM PRECISION (BURR) ×1
BUR ROUND FLUTED 4 SOFT TCH (BURR) ×3 IMPLANT
BUR ROUND FLUTED 4MM SOFT TCH (BURR) ×1
BUR SPIRAL ROUTER 2.3 (BUR) ×3 IMPLANT
BUR SPIRAL ROUTER 2.3MM (BUR) ×1
CANISTER SUCT 3000ML PPV (MISCELLANEOUS) ×8 IMPLANT
CARTRIDGE OIL MAESTRO DRILL (MISCELLANEOUS) ×2 IMPLANT
DIFFUSER DRILL AIR PNEUMATIC (MISCELLANEOUS) ×4 IMPLANT
DRAPE MICROSCOPE LEICA (MISCELLANEOUS) ×4 IMPLANT
DRAPE POUCH INSTRU U-SHP 10X18 (DRAPES) ×4 IMPLANT
DRAPE PROXIMA HALF (DRAPES) ×4 IMPLANT
DRSG ADAPTIC 3X8 NADH LF (GAUZE/BANDAGES/DRESSINGS) ×4 IMPLANT
DRSG TELFA 3X8 NADH (GAUZE/BANDAGES/DRESSINGS) ×4 IMPLANT
DURAMATRIX ONLAY 3X3 (Plate) ×4 IMPLANT
DURAPREP 26ML APPLICATOR (WOUND CARE) ×4 IMPLANT
ELECT REM PT RETURN 9FT ADLT (ELECTROSURGICAL) ×4
ELECTRODE REM PT RTRN 9FT ADLT (ELECTROSURGICAL) ×2 IMPLANT
FORCEPS BIPOLAR SPETZLER 8 1.0 (NEUROSURGERY SUPPLIES) ×4 IMPLANT
GAUZE SPONGE 4X4 12PLY STRL (GAUZE/BANDAGES/DRESSINGS) ×4 IMPLANT
GAUZE SPONGE 4X4 16PLY XRAY LF (GAUZE/BANDAGES/DRESSINGS) IMPLANT
GLOVE BIOGEL PI IND STRL 7.5 (GLOVE) IMPLANT
GLOVE BIOGEL PI INDICATOR 7.5 (GLOVE)
GLOVE ECLIPSE 7.0 STRL STRAW (GLOVE) ×4 IMPLANT
GLOVE ECLIPSE 9.0 STRL (GLOVE) ×4 IMPLANT
GLOVE EXAM NITRILE LRG STRL (GLOVE) IMPLANT
GLOVE EXAM NITRILE XL STR (GLOVE) IMPLANT
GLOVE EXAM NITRILE XS STR PU (GLOVE) IMPLANT
GLOVE INDICATOR 7.5 STRL GRN (GLOVE) ×12 IMPLANT
GLOVE SURG SS PI 7.0 STRL IVOR (GLOVE) ×12 IMPLANT
GOWN STRL REUS W/ TWL LRG LVL3 (GOWN DISPOSABLE) ×2 IMPLANT
GOWN STRL REUS W/ TWL XL LVL3 (GOWN DISPOSABLE) ×6 IMPLANT
GOWN STRL REUS W/TWL 2XL LVL3 (GOWN DISPOSABLE) IMPLANT
GOWN STRL REUS W/TWL LRG LVL3 (GOWN DISPOSABLE) ×2
GOWN STRL REUS W/TWL XL LVL3 (GOWN DISPOSABLE) ×6
HEMOSTAT POWDER SURGIFOAM 1G (HEMOSTASIS) ×8 IMPLANT
HOOK DURA 1/2IN (MISCELLANEOUS) ×4 IMPLANT
KIT BASIN OR (CUSTOM PROCEDURE TRAY) ×4 IMPLANT
KIT NEEDLE BIOPSY 1.8X235 CRAN (NEEDLE) IMPLANT
KIT ROOM TURNOVER OR (KITS) ×4 IMPLANT
MARKER SPHERE PSV REFLC 13MM (MARKER) ×8 IMPLANT
NEEDLE HYPO 25X1 1.5 SAFETY (NEEDLE) ×4 IMPLANT
NS IRRIG 1000ML POUR BTL (IV SOLUTION) ×12 IMPLANT
OIL CARTRIDGE MAESTRO DRILL (MISCELLANEOUS) ×4
PACK CRANIOTOMY (CUSTOM PROCEDURE TRAY) ×4 IMPLANT
PAD ARMBOARD 7.5X6 YLW CONV (MISCELLANEOUS) ×12 IMPLANT
PATTIES SURGICAL .5 X3 (DISPOSABLE) ×4 IMPLANT
PERFORATOR LRG  14-11MM (BIT) ×2
PERFORATOR LRG 14-11MM (BIT) ×2 IMPLANT
PLATE 1.5  2HOLE LNG NEURO (Plate) ×6 IMPLANT
PLATE 1.5 2HOLE LNG NEURO (Plate) ×6 IMPLANT
PLATE 1.5 4HOLE LONG STRAIGHT (Plate) ×4 IMPLANT
SCREW SELF DRILL HT 1.5/4MM (Screw) ×32 IMPLANT
SET TUBING W/EXT DISP (INSTRUMENTS) ×4 IMPLANT
SPONGE SURGIFOAM ABS GEL SZ50 (HEMOSTASIS) ×4 IMPLANT
STAPLER SKIN PROX WIDE 3.9 (STAPLE) ×4 IMPLANT
STAPLER VISISTAT (STAPLE) ×8 IMPLANT
STOCKINETTE 6  STRL (DRAPES) ×2
STOCKINETTE 6 STRL (DRAPES) ×2 IMPLANT
SUT NURALON 4 0 TF (SUTURE) ×8 IMPLANT
SUT VIC AB 0 CT1 18XCR BRD8 (SUTURE) ×4 IMPLANT
SUT VIC AB 0 CT1 8-18 (SUTURE) ×4
SUT VIC AB 3-0 SH 8-18 (SUTURE) ×8 IMPLANT
SYR 5ML LUER SLIP (SYRINGE) IMPLANT
TIP STRAIGHT 25KHZ (INSTRUMENTS) ×4 IMPLANT
TOWEL OR 17X24 6PK STRL BLUE (TOWEL DISPOSABLE) ×4 IMPLANT
TOWEL OR 17X26 10 PK STRL BLUE (TOWEL DISPOSABLE) ×4 IMPLANT
WATER STERILE IRR 1000ML POUR (IV SOLUTION) ×4 IMPLANT

## 2016-05-04 NOTE — Op Note (Signed)
PREOP DIAGNOSIS:  1. Right frontal brain tumor   POSTOP DIAGNOSIS: Same  PROCEDURE: 1. Stereotactic right frontal craniotomy for resection of tumor 2. Use of intraoperative microscope for microdissection  SURGEON: Dr. Consuella Lose, MD  ASSISTANT: Dr. Charlie Pitter, MD  ANESTHESIA: General Endotracheal  EBL: 200cc  SPECIMENS: right frontal tumor for frozen and permanent pathology  DRAINS: None  COMPLICATIONS: None immediate  CONDITION: Hemodynamically stable to PACU  HISTORY: Donald Mayer is a 30 y.o. male [facility] multiple syncopal episodes over the past week. 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 With these findings, surgical resection for diagnosis was indicated. The risks and benefits of the surgery were explained in detail to the patient and his mother. After all questions were answered, informed consent was obtained and witnessed.  PROCEDURE IN DETAIL: The patient was brought to the operating room via stretcher.  After induction of general anesthesia, the patient was positioned on the operative table in the [place] headholder in the supine position. All pressure points were meticulously padded. Utilizing the preoperative stereotactic MRI scan, surface markers were co-registered until satisfactory accuracy was achieved. This was then used to plan out a standard frontotemporal skin incision to allow access to the inferior right frontal tumor. Skin incision was then marked out and prepped and draped in the usual sterile fashion.  After timeout was conducted, the incision was infiltrated with local anesthetic with epinephrine. Incision was then made sharply and carried down through the subcutaneous tissue and the galea was incised. Hemostasis was secured with Raney clips. The temporalis  fascia and muscle was then incised, and a single piece myocutaneous flap was elevated with the Bovie, and reflected anteriorly. Standard frontotemporal craniotomy was then fashioned with the high-speed drill to create 3 bur holes which were connected with the craniotome. Bone flap was then elevated, and hemostasis was achieved on the epidural surface with bipolar electrocautery. High-speed drill was then used to drill down the lesser wing of the sphenoid until it was flush with the orbital roof. The dura was then incised in a horseshoe fashion based anteriorly and reflected and held in place with 4-0 Nurolon. At this point the microscope was draped sterilely and brought into the field, and the remainder of the case was done under the microscope using microdissection.  The inferior frontal gyrus was identified, and there was a clear abnormality, with that portion of the gyrus appearing pale, and somewhat lobulated. The pia at the margin of the inferior frontal gyrus was then coagulated and cut. Utilizing a combination of bipolar electrocautery and the ultrasonic aspirator set at low settings, the tumor was slowly dissected away from the surrounding white matter. It was noted to be somewhat more firm, and more stringy than normal white matter. In this manner, the tumor was dissected away from the surrounding white matter in the posterior, superior, and anterior planes. A sub-peel dissection was then carried out along the superior margin of the sylvian fissure, taking care to preserve the middle cerebral artery and superficial middle cerebral veins. Finally, the deep margin of the tumor was developed, and the tumor was removed en bloc.  Margins of the resection were then inspected, and a small amount of residual tumor in the posterior deep portion of the resection cavity was removed with the ultrasonic aspirator.  Having completed resection, hemostasis on the resection  cavity was achieved with a combination of  bipolar electrocautery and morcellized Gelfoam with thrombin. The wound was then irrigated with copious amounts of normal saline irrigation. The dura was then reapproximated with interrupted 4 Nurolon stitches. The dural surface was covered with a layer of dura matrix. Bone flap was then replaced with standard titanium plates and screws. The temporalis muscle was reapproximated with interrupted 0 Vicryl stitches, the galea was closed with combination of 0 and 3-0 Vicryl stitches, and the skin was closed with staples. At the end of the case all sponge, needle, instrument, and cottonoid counts were correct.  The patient was then removed from the [place] head holder, sterile dressing was applied, and he was then transferred to the stretcher, asked abated, and taken to the post anesthesia care unit in stable hemodynamic condition.

## 2016-05-04 NOTE — Progress Notes (Signed)
No issues overnight. Pt has no complaints this am. Ready to proceed with surgery.  EXAM:  BP 135/72 (BP Location: Right Arm)   Pulse 81   Temp 98.3 F (36.8 C) (Oral)   Resp 18   Ht 6\' 1"  (1.854 m)   Wt (!) 155.4 kg (342 lb 8 oz)   SpO2 97%   BMI 45.19 kg/m   Awake, alert, oriented  Speech fluent, appropriate  CN grossly intact  5/5 BUE/BLE   IMPRESSION:  30 y.o. 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 Plan for surgery this pm.  PLAN: - Surgery this afternoon - NPO - ICU postop

## 2016-05-04 NOTE — Plan of Care (Signed)
[facility] PROGRESS NOTE  Patient: Donald Mayer [id number]   PCP: Mackie Pai, PA-C DOB: [birth year]   DOA: 04/30/2016   DOS: 05/04/2016    Pt in surgery since AM, unable to evaluate, pt was seen by neurosurgery in AM. Currently undergoing surgery for right frontal lobe mass. Will be transferred to ICU post-op per neurosurgery. Greatly appreciate care from neurosurgery as well as CCM.   Author: Berle Mull, MD [facility] Pager: [phone] 05/04/2016 6:22 PM   If 7PM-7AM, please contact night-coverage at [email or web address], password [facility]

## 2016-05-04 NOTE — Anesthesia Procedure Notes (Signed)
Procedures

## 2016-05-04 NOTE — Anesthesia Preprocedure Evaluation (Addendum)
Anesthesia Evaluation  Patient identified by MRN, date of birth, ID band Patient awake    Reviewed: Allergy & Precautions, NPO status , Patient's Chart, lab work & pertinent test results  History of Anesthesia Complications Negative for: history of anesthetic complications  Airway Mallampati: II  TM Distance: >3 FB Neck ROM: Full    Dental  (+) Teeth Intact, Dental Advisory Given, Chipped   Pulmonary neg pulmonary ROS,    Pulmonary exam normal breath sounds clear to auscultation       Cardiovascular Exercise Tolerance: Good negative cardio ROS Normal cardiovascular exam Rhythm:Regular Rate:Normal     Neuro/Psych  Headaches, Right frontal lobe mass negative psych ROS   GI/Hepatic negative GI ROS, Neg liver ROS,   Endo/Other  Morbid obesity  Renal/GU Renal InsufficiencyRenal disease     Musculoskeletal negative musculoskeletal ROS (+)   Abdominal   Peds  Hematology negative hematology ROS (+)   Anesthesia Other Findings Day of surgery medications reviewed with the patient.  Reproductive/Obstetrics                            Anesthesia Physical Anesthesia Plan  ASA: II  Anesthesia Plan: General   Post-op Pain Management:    Induction: Intravenous  Airway Management Planned: Oral ETT  Additional Equipment: Arterial line, CVP and Ultrasound Guidance Line Placement  Intra-op Plan:   Post-operative Plan: Possible Post-op intubation/ventilation  Informed Consent: I have reviewed the patients History and Physical, chart, labs and discussed the procedure including the risks, benefits and alternatives for the proposed anesthesia with the patient or authorized representative who has indicated his/her understanding and acceptance.   Dental advisory given  Plan Discussed with: CRNA  Anesthesia Plan Comments: (Risks/benefits of general anesthesia discussed with patient including risk of  damage to teeth, lips, gum, and tongue, nausea/vomiting, allergic reactions to medications, and the possibility of heart attack, stroke and death.  All patient questions answered.  Patient wishes to proceed.)        Anesthesia Quick Evaluation

## 2016-05-04 NOTE — Anesthesia Postprocedure Evaluation (Signed)
Anesthesia Post Note  Patient: Donald Mayer  Procedure(s) Performed: Procedure(s) (LRB): STERIOTACTIC RIGHT FRONTAL CRANIOTOMY with BrainLab (Right) APPLICATION OF CRANIAL NAVIGATION (N/A)  Patient location during evaluation: PACU Anesthesia Type: General Level of consciousness: awake and alert Pain management: pain level controlled Vital Signs Assessment: post-procedure vital signs reviewed and stable Respiratory status: spontaneous breathing, nonlabored ventilation, respiratory function stable and patient connected to nasal cannula oxygen Cardiovascular status: blood pressure returned to baseline and stable Postop Assessment: no signs of nausea or vomiting Anesthetic complications: no    Last Vitals:  Vitals:   05/04/16 2100 05/04/16 2106  BP:  (!) 147/57  Pulse:  76  Resp:  (!) 23  Temp: 37.1 C     Last Pain:  Vitals:   05/04/16 2100  TempSrc: Oral  PainSc:                  Lindsee Labarre,W. EDMOND

## 2016-05-04 NOTE — Anesthesia Procedure Notes (Signed)
Procedure Name: Intubation Date/Time: 05/04/2016 3:00 PM Performed by: Sampson Si E Pre-anesthesia Checklist: Patient identified, Emergency Drugs available, Suction available and Patient being monitored Patient Re-evaluated:Patient Re-evaluated prior to inductionOxygen Delivery Method: Circle System Utilized Preoxygenation: Pre-oxygenation with 100% oxygen Intubation Type: IV induction Ventilation: Mask ventilation without difficulty Laryngoscope Size: Mac and 4 Grade View: Grade I Tube type: Subglottic suction tube Tube size: 7.5 mm Number of attempts: 1 Airway Equipment and Method: Stylet and Oral airway Placement Confirmation: ETT inserted through vocal cords under direct vision,  positive ETCO2 and breath sounds checked- equal and bilateral Secured at: 24 cm Tube secured with: Tape Dental Injury: Teeth and Oropharynx as per pre-operative assessment

## 2016-05-04 NOTE — Anesthesia Procedure Notes (Signed)
Central Venous Catheter Insertion Performed by: anesthesiologist 05/04/2016 2:38 PM Patient location: Pre-op. Preanesthetic checklist: patient identified, IV checked, site marked, risks and benefits discussed, surgical consent, monitors and equipment checked, pre-op evaluation, timeout performed and anesthesia consent Lidocaine 1% used for infiltration Landmarks identified Catheter size: 8 Fr Central line was placed.Double lumen Procedure performed using ultrasound guided technique. Attempts: 1 Following insertion, dressing applied, line sutured and Biopatch. Post procedure assessment: blood return through all ports, free fluid flow and no air. Patient tolerated the procedure well with no immediate complications.

## 2016-05-04 NOTE — Transfer of Care (Signed)
Immediate Anesthesia Transfer of Care Note  Patient: Donald Mayer  Procedure(s) Performed: Procedure(s) with comments: [place] with BrainLab (Right) - right APPLICATION OF CRANIAL NAVIGATION (N/A)  Patient Location: PACU  Anesthesia Type:General  Level of Consciousness: lethargic and responds to stimulation  Airway & Oxygen Therapy: Patient Spontanous Breathing and Patient connected to face mask oxygen  Post-op Assessment: Report given to RN and Patient moving all extremities X 4  Post vital signs: Reviewed and stable  Last Vitals:  Vitals:   05/04/16 1341 05/04/16 1845  BP: 131/75 139/69  Pulse: 82 90  Resp: 18 (!) 25  Temp: 36.2 C 36.5 C    Last Pain:  Vitals:   05/04/16 1341  TempSrc: Oral  PainSc:       Patients Stated Pain Goal: 0 ([id number])  Complications: No apparent anesthesia complications

## 2016-05-05 ENCOUNTER — Inpatient Hospital Stay (HOSPITAL_COMMUNITY): Payer: Managed Care, Other (non HMO)

## 2016-05-05 ENCOUNTER — Encounter (HOSPITAL_COMMUNITY): Payer: Self-pay | Admitting: Neurosurgery

## 2016-05-05 LAB — CBC WITH DIFFERENTIAL/PLATELET
Basophils Absolute: 0 10*3/uL (ref 0.0–0.1)
Basophils Relative: 0 %
Eosinophils Absolute: 0 10*3/uL (ref 0.0–0.7)
Eosinophils Relative: 0 %
HEMATOCRIT: 37.5 % — AB (ref 39.0–52.0)
Hemoglobin: 12.6 g/dL — ABNORMAL LOW (ref 13.0–17.0)
LYMPHS PCT: 4 %
Lymphs Abs: 0.9 10*3/uL (ref 0.7–4.0)
MCH: 28.4 pg (ref 26.0–34.0)
MCHC: 33.6 g/dL (ref 30.0–36.0)
MCV: 84.5 fL (ref 78.0–100.0)
MONO ABS: 0.8 10*3/uL (ref 0.1–1.0)
MONOS PCT: 3 %
NEUTROS ABS: 22.5 10*3/uL — AB (ref 1.7–7.7)
Neutrophils Relative %: 93 %
Platelets: 242 10*3/uL (ref 150–400)
RBC: 4.44 MIL/uL (ref 4.22–5.81)
RDW: 13.8 % (ref 11.5–15.5)
WBC: 24.3 10*3/uL — ABNORMAL HIGH (ref 4.0–10.5)

## 2016-05-05 LAB — COMPREHENSIVE METABOLIC PANEL
ALT: 23 U/L (ref 17–63)
ANION GAP: 8 (ref 5–15)
AST: 42 U/L — AB (ref 15–41)
Albumin: 3.1 g/dL — ABNORMAL LOW (ref 3.5–5.0)
Alkaline Phosphatase: 70 U/L (ref 38–126)
BILIRUBIN TOTAL: 0.2 mg/dL — AB (ref 0.3–1.2)
BUN: 13 mg/dL (ref 6–20)
CO2: 22 mmol/L (ref 22–32)
Calcium: 8.8 mg/dL — ABNORMAL LOW (ref 8.9–10.3)
Chloride: 104 mmol/L (ref 101–111)
Creatinine, Ser: 1.29 mg/dL — ABNORMAL HIGH (ref 0.61–1.24)
GFR calc Af Amer: >60 mL/min (ref >60)
Glucose, Bld: 142 mg/dL — ABNORMAL HIGH (ref 65–99)
POTASSIUM: 4 mmol/L (ref 3.5–5.1)
Sodium: 134 mmol/L — ABNORMAL LOW (ref 135–145)
TOTAL PROTEIN: 6.9 g/dL (ref 6.5–8.1)

## 2016-05-05 MED FILL — Thrombin For Soln 5000 Unit: CUTANEOUS | Qty: 5000 | Status: AC

## 2016-05-05 NOTE — Progress Notes (Signed)
Pt seen and examined. No issues overnight. No significant complaints. Was out of bed walking today.  EXAM: Temp:  [97.7 F (36.5 C)-100.1 F (37.8 C)] 98.5 F (36.9 C) (11/21 1200) Pulse Rate:  [64-91] 80 (11/21 1300) Resp:  [20-30] 21 (11/21 1300) BP: (109-159)/(41-79) 109/45 (11/21 1300) SpO2:  [90 %-100 %] 96 % (11/21 1300) Arterial Line BP: (108-152)/(51-68) 111/66 (11/21 1000) Weight:  [159.7 kg (352 lb 1.2 oz)] 159.7 kg (352 lb 1.2 oz) (11/20 2106) Intake/Output      11/20 0701 - 11/21 0700 11/21 0701 - 11/22 0700   I.V. (mL/kg) 2138.8 (13.4) 450 (2.8)   IV Piggyback 305 105   Total Intake(mL/kg) 2443.8 (15.3) 555 (3.5)   Urine (mL/kg/hr) 2475 (0.6) 1175 (0.8)   Blood 200 (0.1)    Total Output 2675 1175   Net -231.3 -620         Drowsy, but easily arousable CN intact Moving all ext well Turban dressing in place  LABS: Lab Results  Component Value Date   CREATININE 1.29 (H) 05/05/2016   BUN 13 05/05/2016   NA 134 (L) 05/05/2016   K 4.0 05/05/2016   CL 104 05/05/2016   CO2 22 05/05/2016   Lab Results  Component Value Date   WBC 24.3 (H) 05/05/2016   HGB 12.6 (L) 05/05/2016   HCT 37.5 (L) 05/05/2016   MCV 84.5 05/05/2016   PLT 242 05/05/2016    IMPRESSION: - 30 y.o. male POD#1 s/p right frontal tumor resection, doing well.  PLAN: - Cont to observe in ICU - MRI today - after MRI can likely d/c CVC - Cont to mobilize

## 2016-05-05 NOTE — Care Management Note (Signed)
Case Management Note  Patient Details  Name: Donald Mayer MRN: [id number] Date of Birth: 1985-07-26  Subjective/Objective:   Pt admitted to [facility] on 05/04/16 s/p Rt frontal craniotomy for resection of tumor.  PTA, pt independent of ADLS.    Supportive family at bedside.                  Action/Plan: Will follow for discharge planning as pt progresses.  Recommend PT/OT consults.   Expected Discharge Date:                  Expected Discharge Plan:  [place]  In-House Referral:     Discharge planning Services  CM Consult  Post Acute Care Choice:    Choice offered to:     DME Arranged:    DME Agency:     HH Arranged:    [place] Agency:     Status of Service:  In process, will continue to follow  If discussed at Long Length of Stay Meetings, dates discussed:    Additional Comments:  Reinaldo Raddle, RN, BSN  Trauma/Neuro ICU Case Manager [phone]

## 2016-05-05 NOTE — Plan of Care (Signed)
[facility] PROGRESS NOTE  Patient: Donald Mayer [id number]   PCP: Mackie Pai, PA-C DOB: February 29, 1986   DOA: 04/30/2016   DOS: 05/05/2016    In ICU post-op per neurosurgery. Greatly appreciate care from neurosurgery. Will sign off.   Author: Berle Mull, MD [facility] Pager: [phone] 05/05/2016 1:47 PM   If 7PM-7AM, please contact night-coverage at [email or web address], password [facility]

## 2016-05-06 MED ORDER — LEVETIRACETAM 500 MG PO TABS
500.0000 mg | ORAL_TABLET | Freq: Two times a day (BID) | ORAL | Status: DC
  Administered 2016-05-06 – 2016-05-10 (×9): 500 mg via ORAL
  Filled 2016-05-06 (×9): qty 1

## 2016-05-06 MED ORDER — PANTOPRAZOLE SODIUM 40 MG PO TBEC
40.0000 mg | DELAYED_RELEASE_TABLET | Freq: Every day | ORAL | Status: DC
  Administered 2016-05-06 – 2016-05-09 (×4): 40 mg via ORAL
  Filled 2016-05-06 (×4): qty 1

## 2016-05-06 NOTE — Progress Notes (Addendum)
Patient is transferred from [place] to [id number]. Patient is alert and oriented and family is at the bedside

## 2016-05-06 NOTE — Progress Notes (Signed)
Pt being fed by nurses, so will return another time.   05/06/16 1500  Clinical Encounter Type  Visited With Patient not available  Visit Type Initial  Referral From [place] Kenedy Haisley, Chaplain

## 2016-05-06 NOTE — Evaluation (Signed)
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   Clinical Impression   Pt was independent prior to admission. Presents with impaired cognition, generalized weakness and impaired standing balance. Pt with equal strength B UEs and intact sensation, but noted to not use L UE when pushing into standing. Pt with delayed processing speed and not oriented to time. Will follow acutely.    Follow Up Recommendations  Outpatient OT;Supervision/Assistance - 24 hour    Equipment Recommendations       Recommendations for Other Services       Precautions / Restrictions Precautions Precautions: Fall Restrictions Weight Bearing Restrictions: No      Mobility Bed Mobility Overal bed mobility: Needs Assistance Bed Mobility: Sit to Supine       Sit to supine: Supervision;HOB elevated   General bed mobility comments: Able to bring BLEs into bed without assist.   Transfers Overall transfer level: Needs assistance Equipment used: None Transfers: Sit to/from Stand Sit to Stand: Min guard         General transfer comment: Min guard for safety. Not using LUE to stand but no physical assist needed.    Balance Overall balance assessment: Needs assistance Sitting-balance support: Feet supported;No upper extremity supported Sitting balance-Leahy Scale: Good Sitting balance - Comments: Able to reach outside BoS and adjust socks without difficulty.    Standing balance support: During functional activity Standing balance-Leahy Scale: Fair Standing balance comment: Able to stand statically without UE support but requires UE support fordynamic standing.                            ADL Overall ADL's : Needs  assistance/impaired Eating/Feeding: Independent   Grooming: Standing;Min guard   Upper Body Bathing: Sitting;Minimal assitance   Lower Body Bathing: Minimal assistance;Sit to/from stand   Upper Body Dressing : Minimal assistance;Sitting   Lower Body Dressing: Minimal assistance;Sit to/from stand   Toilet Transfer: Minimal assistance;Ambulation   Toileting- Clothing Manipulation and Hygiene: Min guard;Sit to/from stand       Functional mobility during ADLs: Minimal assistance       Vision Additional Comments: L eye with swollen lid, but pt denies vision changes   Perception     Praxis      Pertinent Vitals/Pain Pain Assessment: No/denies pain     Hand Dominance Left   Extremity/Trunk Assessment Upper Extremity Assessment Upper Extremity Assessment: Generalized weakness (grossly 4-/5)   Lower Extremity Assessment Lower Extremity Assessment: Defer to PT evaluation RLE Deficits / Details: Grossly ~3+/5 throughuot but instability noted during functional mobility. RLE Sensation:  [facility].)   Cervical / Trunk Assessment Cervical / Trunk Assessment: Normal   Communication Communication Communication: No difficulties   Cognition Arousal/Alertness: Awake/alert Behavior During Therapy: Flat affect Overall Cognitive Status: Impaired/Different from baseline Area of Impairment: Problem solving             Problem Solving: Slow processing;Requires verbal cues General Comments: When given options, pt always answering with last option given. Some delayed responses.   General Comments       Exercises       Shoulder Instructions      Home Living Family/patient expects to be discharged to:: Private residence Living Arrangements: Alone Available  Help at Discharge: Family;Available 24 hours/day Type of Home: Apartment Home Access: Stairs to enter Entrance Stairs-Number of Steps: 6+6 down Entrance Stairs-Rails: None;Left Home Layout: One level     Bathroom  Shower/Tub: Teacher, early years/pre: Standard     Home Equipment: None          Prior Functioning/Environment Level of Independence: Independent        Comments: works from home for [employer]        OT Problem List: Decreased strength;Decreased activity tolerance;Impaired balance (sitting and/or standing);Decreased cognition;Obesity   OT Treatment/Interventions: Self-care/ADL training;DME and/or AE instruction;Cognitive remediation/compensation;Therapeutic exercise;Therapeutic activities;Patient/family education    OT Goals(Current goals can be found in the care plan section) Acute Rehab OT Goals Patient Stated Goal: to get some fresh air OT Goal Formulation: With patient Time For Goal Achievement: 05/13/16 Potential to Achieve Goals: Good ADL Goals Pt Will Perform Grooming: with supervision;standing Pt Will Transfer to Toilet: with supervision;ambulating;regular height toilet Pt Will Perform Toileting - Clothing Manipulation and hygiene: with supervision;sit to/from stand Pt/caregiver will Perform Home Exercise Program: Increased strength;Both right and left upper extremity;With Supervision (level 3 theraband) Additional ADL Goal #1: Pt will identify and gather items necessary for ADL with supervision. Additional ADL Goal #2: Pt will perform bathing and dressing with supervision.  OT Frequency: Min 2X/week   Barriers to D/C:            Co-evaluation PT/OT/SLP Co-Evaluation/Treatment: Yes Reason for Co-Treatment: For patient/therapist safety   OT goals addressed during session: ADL's and self-care      End of Session Equipment Utilized During Treatment: Gait belt Nurse Communication: Mobility status (cognition)  Activity Tolerance: Patient tolerated treatment well Patient left: in bed;with call bell/phone within reach   Time: 0920-0940 OT Time Calculation (min): 20 min Charges:  OT General Charges $OT Visit: 1 Procedure OT Evaluation $OT Eval  Moderate Complexity: 1 Procedure G-Codes:    Malka So 05/06/2016, 10:06 AM  [phone]

## 2016-05-06 NOTE — Progress Notes (Signed)
Pt seen and examined. No issues overnight. No complaints this am.  EXAM: Temp:  [98.5 F (36.9 C)-100 F (37.8 C)] 98.8 F (37.1 C) (11/22 0400) Pulse Rate:  [69-104] 90 (11/22 0800) Resp:  [16-25] 18 (11/22 0800) BP: (92-140)/(41-79) 131/70 (11/22 0800) SpO2:  [90 %-97 %] 94 % (11/22 0800) Arterial Line BP: (111-120)/(63-66) 111/66 (11/21 1000) Intake/Output      11/21 0701 - 11/22 0700 11/22 0701 - 11/23 0700   I.V. (mL/kg) 1800 (11.3) 75 (0.5)   IV Piggyback 210    Total Intake(mL/kg) 2010 (12.6) 75 (0.5)   Urine (mL/kg/hr) 2775 (0.7)    Blood     Total Output 2775     Net -765 +75         Awake, alert, oriented Speech fluent CN intact Good strength Wound c/d/i  LABS: Lab Results  Component Value Date   CREATININE 1.29 (H) 05/05/2016   BUN 13 05/05/2016   NA 134 (L) 05/05/2016   K 4.0 05/05/2016   CL 104 05/05/2016   CO2 22 05/05/2016   Lab Results  Component Value Date   WBC 24.3 (H) 05/05/2016   HGB 12.6 (L) 05/05/2016   HCT 37.5 (L) 05/05/2016   MCV 84.5 05/05/2016   PLT 242 05/05/2016    IMAGING: MRI reviewed, no identifiable residual tumor. Diffusion restriction likely postop.  IMPRESSION: - 30 y.o. male POD#2 s/p resection of right frontal tumor, doing well. Postop MRI clean.   PLAN: - Transfer to floor today. - PT/OT - Likely home over the weekend.

## 2016-05-06 NOTE — Evaluation (Signed)
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  Clinical Impression  Patient presents with generalized weakness, impaired balance, flat affect, delayed processing, impaired problem solving and impaired mobility s/p above. Pt reluctant to use LUE during transfers. Tolerated gait training with Min A for balance/safety. Eager to get some fresh air. Recommend speech consult for full cognitive assessment. Pt reports he will have 24/7 S at home. Will follow acutely to maximize independence and mobility prior to return home.     Follow Up Recommendations Outpatient PT;Supervision/Assistance - 24 hour    Equipment Recommendations  Other (comment) (TBA)    Recommendations for Other Services Speech consult (Recommend cognitive assessment)     Precautions / Restrictions Precautions Precautions: Fall Restrictions Weight Bearing Restrictions: No      Mobility  Bed Mobility Overal bed mobility: Needs Assistance Bed Mobility: Sit to Supine       Sit to supine: Supervision;HOB elevated   General bed mobility comments: Able to bring BLEs into bed without assist.   Transfers Overall transfer level: Needs assistance Equipment used: None Transfers: Sit to/from Stand Sit to Stand: Min guard         General transfer comment: Min guard for safety. Not using LUE to stand but no physical assist needed.  Ambulation/Gait Ambulation/Gait assistance: Min assist Ambulation Distance (Feet): 75 Feet (x2 bouts) Assistive device: 1 person hand held assist (IV pole) Gait Pattern/deviations: Step-through pattern;Decreased stride length;Wide base of support Gait velocity: decreased Gait velocity interpretation: Below normal speed for age/gender General Gait  Details: Slow, unsteady gait with instability noted RLE. Drifting noted. handheld assist for support and use of IV pole. HR up to 123 bpm.  Stairs            Wheelchair Mobility    Modified Rankin (Stroke Patients Only)       Balance Overall balance assessment: Needs assistance Sitting-balance support: Feet supported;No upper extremity supported Sitting balance-Leahy Scale: Good Sitting balance - Comments: Able to reach outside BoS and adjust socks without difficulty.    Standing balance support: During functional activity Standing balance-Leahy Scale: Fair Standing balance comment: Able to stand statically without UE support but requires UE support fordynamic standing.                             Pertinent Vitals/Pain Pain Assessment: No/denies pain    Home Living Family/patient expects to be discharged to:: Private residence Living Arrangements: Alone Available Help at Discharge: Family;Available 24 hours/day Type of Home: Apartment Home Access: Stairs to enter Entrance Stairs-Rails: None;Left Entrance Stairs-Number of Steps: 6+6 down Home Layout: One level Home Equipment: None      Prior Function Level of Independence: Independent         Comments: works from home for [employer]   Dominant Hand: Left    Extremity/Trunk Assessment   Upper Extremity Assessment: Defer to OT evaluation;Generalized weakness           Lower Extremity Assessment: Generalized weakness;RLE deficits/detail RLE Deficits / Details: Grossly ~3+/5 throughuot but instability noted during functional mobility.    Cervical / Trunk Assessment: Normal  Communication   Communication: No difficulties  Cognition Arousal/Alertness: Awake/alert Behavior During Therapy: Flat  affect Overall Cognitive Status: Impaired/Different from baseline Area of Impairment: Problem solving             Problem Solving: Slow processing;Requires verbal cues General  Comments: When given options, pt always answering with last option given. Some delayed responses.    General Comments General comments (skin integrity, edema, etc.): Right eye swollen. Able to open slightly.    Exercises     Assessment/Plan    PT Assessment Patient needs continued PT services  PT Problem List Decreased strength;Decreased mobility;Obesity;Decreased balance;Decreased cognition;Decreased activity tolerance;Cardiopulmonary status limiting activity          PT Treatment Interventions DME instruction;Therapeutic activities;Gait training;Therapeutic exercise;Patient/family education;Stair training;Functional mobility training;Balance training    PT Goals (Current goals can be found in the Care Plan section)  Acute Rehab PT Goals Patient Stated Goal: to get some fresh air PT Goal Formulation: With patient Time For Goal Achievement: 05/20/16 Potential to Achieve Goals: Good    Frequency Min 3X/week   Barriers to discharge Inaccessible home environment stairs to enter home    Co-evaluation PT/OT/SLP Co-Evaluation/Treatment: Yes Reason for Co-Treatment: For patient/therapist safety           End of Session   Activity Tolerance: Patient tolerated treatment well Patient left: in bed;with call bell/phone within reach Nurse Communication: Mobility status         Time: [id number] PT Time Calculation (min) (ACUTE ONLY): 21 min   Charges:   PT Evaluation $PT Eval Moderate Complexity: 1 Procedure     PT G Codes:        Kirbie Stodghill A Onisha Cedeno 05/06/2016, 9:57 AM Wray Kearns, PT, DPT [phone]

## 2016-05-07 NOTE — Progress Notes (Signed)
Patient ID: Donald Mayer, male   DOB: 10/15/1985, 30 y.o.   MRN: [id number] Overall looks pretty good. No headache. Some swelling of the right face. Moving all extremities. Continue current management.

## 2016-05-08 NOTE — Progress Notes (Signed)
Patient ID: Donald Mayer, male   DOB: December 21, 1985, 30 y.o.   MRN: [id number] [name] is doing very well this morning no significant headache  Strength 5 out of 5 wound clean dry and intact awake and alert  Mobilized day with physical and occupational therapy will assess home health needs problem discharge tomorrow

## 2016-05-08 NOTE — Progress Notes (Signed)
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   OT comments  This 30 yo male admitted and underwent above presents to acute OT scoring a 15/30 on the [facility] with normal being >26/30. He will benefit from a OP speech more so than OP OT and I have changed recommendations to reflect this.   Follow Up Recommendations  No OT follow up;Supervision/Assistance - 24 hour;Other (comment) (needs Outpatient Speech for cognition)    Equipment Recommendations  None recommended by OT                  ADL Overall ADL's : Needs assistance/impaired                     Lower Body Dressing: Supervision/safety (for socks. Pt stated that he felt dizzy/light headed and worn out after having him do the [facility] with me and thus did not try anything up on his feet at this time)                        Vision                 Additional Comments: left eye not swollen today and pt does not report any issues with vision today          Cognition   Behavior During Therapy: Flat affect Overall Cognitive Status: Impaired/Different from baseline Area of Impairment: Following commands;Problem solving        Following Commands: Follows one step commands inconsistently     Problem Solving: Slow processing;Difficulty sequencing;Requires verbal cues General Comments: Pt was administered MOCA today with score of 15/30. Some errors pt was aware of and others he was not. VISUOSPATIAL/EXECUTIVE (decreased sequencing of letters and numbers in ascending order, numbers and hands on clock incorrect); NAMING: named camel as a giraffe; ATTENTION: missed repeating numbers backwards after I read them forward (pt reported to me that he thought he did repeat them  backwards on both trials that I asked of him), missed one "A" for tapping and added one tap for letter other than "A", could not subtract by 7's from 100; LANGUAGE: Added a word to one of the sentences, named 9 words starting with "F"; ABSTRACTION: missed similarity of watch-ruler; DELAYED RECALL: 2/5; ORIENTATION: 6/6.                 Pertinent Vitals/ Pain       Pain Assessment: No/denies pain         Frequency  Min 2X/week        Progress Toward Goals  OT Goals(current goals can now be found in the care plan section)  Progress towards OT goals: Progressing toward goals     Plan Discharge plan needs to be updated          Activity Tolerance  (pt reported fatigue and dizziness post MOCA administration)   Patient Left in bed;with call bell/phone within reach;with family/visitor present   Nurse Communication          Time: [id number] OT Time Calculation (min): 42 min  Charges: OT General Charges $OT Visit: 1 Procedure OT Treatments $Cognitive Skills Development: 38-52 mins  Almon Register [id number] 05/08/2016, 4:00 PM

## 2016-05-08 NOTE — Progress Notes (Signed)
Pt complains of blurry vision, reported having headache while straining eyes to see the TV. Pt requested and received Tylenol for headache. Will continue to monitor

## 2016-05-08 NOTE — Care Management Note (Signed)
Case Management Note  Patient Details  Name: [name] MRN: [id number] Date of Birth: 02-Mar-1986  Subjective/Objective:                    Action/Plan: Plan is for patient to discharge home with his mother to [place], [place]. She and her family can provide the 24 hour supervision needed. Recommendations are for outpatient therapy. Dr Saintclair Halsted in agreement. CM met with the patient and his mother and they would like to go to rehab at [facility]. Orders placed in EPIC and information on the AVS.  Expected Discharge Date:                  Expected Discharge Plan:  [place]  In-House Referral:     Discharge planning Services  CM Consult  Post Acute Care Choice:    Choice offered to:     DME Arranged:    DME Agency:     HH Arranged:    HH Agency:     Status of Service:  Completed, signed off  If discussed at [employer] of Stay Meetings, dates discussed:    Additional Comments:  Pollie Friar, RN 05/08/2016, 4:05 PM

## 2016-05-09 MED ORDER — DEXAMETHASONE 2 MG PO TABS
2.0000 mg | ORAL_TABLET | Freq: Three times a day (TID) | ORAL | Status: DC
Start: 2016-05-09 — End: 2016-05-10
  Administered 2016-05-09 – 2016-05-10 (×3): 2 mg via ORAL
  Filled 2016-05-09 (×3): qty 1

## 2016-05-09 NOTE — Progress Notes (Signed)
Patient ID: Donald Mayer, male   DOB: 1986-01-24, 30 y.o.   MRN: [id number] Subjective:  The patient is alert and pleasant. He wants to eat.  Objective: Vital signs in last 24 hours: Temp:  [97.8 F (36.6 C)-98.6 F (37 C)] 98 F (36.7 C) (11/25 0448) Pulse Rate:  [64-90] 72 (11/25 0448) Resp:  [17-20] 17 (11/25 0448) BP: (128-140)/(60-75) 137/75 (11/25 0448) SpO2:  [95 %-100 %] 100 % (11/25 0448)  Intake/Output from previous day: No intake/output data recorded. Intake/Output this shift: No intake/output data recorded.  Physical exam the patient is alert and oriented. His wound is healing well. His speech is normal. He is moving all 4 extremities well.  Lab Results: No results for input(s): WBC, HGB, HCT, PLT in the last 72 hours. BMET No results for input(s): NA, K, CL, CO2, GLUCOSE, BUN, CREATININE, CALCIUM in the last 72 hours.  Studies/Results: No results found.  Assessment/Plan: Postop day #5: I will start an ADA diet. He may be able to go home tomorrow.  LOS: 9 days     Inaya Gillham D 05/09/2016, 9:14 AM

## 2016-05-09 NOTE — Progress Notes (Signed)
Occupational Therapy Treatment Patient Details Name: Donald Mayer MRN: [id number] DOB: 1986/02/14 Today's Date: 05/09/2016    History of present illness Patient is a 30 y/o male with hx of ORIF left wrist and no other PMH presents s/p Rt frontal craniotomy for resection of tumor.   OT comments  Pt able to recall areas of deficit from MoCA performed yesterday and concerned about his difficulty with calculations, word recall and spatial awareness. Educated pt in how deficits may impact IADL and safety including stove/microwave use, managing medications and appointment calendar. Pt stepped over edge of tub with min guard assist, slightly off balance, but self corrected.   Follow Up Recommendations  No OT follow up;Supervision/Assistance - 24 hour    Equipment Recommendations  None recommended by OT    Recommendations for Other Services      Precautions / Restrictions Precautions Precautions: Fall Restrictions Weight Bearing Restrictions: No       Mobility Bed Mobility Overal bed mobility: Independent                Transfers   Equipment used: None   Sit to Stand: Modified independent (Device/Increase time)         General transfer comment: no assist, steady    Balance     Sitting balance-Leahy Scale: Good       Standing balance-Leahy Scale: Fair                     ADL       Grooming: Standing;Wash/dry hands;Wash/dry face;Oral care;Supervision/safety               Lower Body Dressing: Sit to/from stand;Modified independent   Toilet Transfer: Ambulation;Supervision/safety   Toileting- Clothing Manipulation and Hygiene: Modified independent;Sit to/from stand       Functional mobility during ADLs: Supervision/safety General ADL Comments: min guard to step over edge of tub      Vision                     Perception     Praxis      Cognition   Behavior During Therapy: [facility] for tasks assessed/performed Overall  Cognitive Status: Impaired/Different from baseline Area of Impairment: Problem solving              Problem Solving: Slow processing;Difficulty sequencing;Requires verbal cues General Comments: Pt recalling cognitive assessment administered yesterday and items that he had difficulty.    Extremity/Trunk Assessment               Exercises     Shoulder Instructions       General Comments      Pertinent Vitals/ Pain       Pain Assessment: No/denies pain  Home Living                                          Prior Functioning/Environment              Frequency  Min 2X/week        Progress Toward Goals  OT Goals(current goals can now be found in the care plan section)  Progress towards OT goals: Progressing toward goals  Acute Rehab OT Goals Patient Stated Goal: get stronger Time For Goal Achievement: 05/13/16 Potential to Achieve Goals: Good  Plan Discharge plan remains appropriate    Co-evaluation  End of Session Equipment Utilized During Treatment: Gait belt   Activity Tolerance Patient tolerated treatment well   Patient Left in bed;with call bell/phone within reach   Nurse Communication          Time: [id number] OT Time Calculation (min): 27 min  Charges: OT General Charges $OT Visit: 1 Procedure OT Treatments $Self Care/Home Management : 8-22 mins $Cognitive Skills Development: 8-22 mins  Malka So 05/09/2016, 4:40 PM  [phone]

## 2016-05-09 NOTE — Progress Notes (Signed)
Physical Therapy Treatment Patient Details Name: Donald Mayer MRN: [id number] DOB: 1985-10-30 Today's Date: 05/09/2016    History of Present Illness Patient is a 30 y/o male with hx of ORIF left wrist and no other PMH presents s/p Rt frontal craniotomy for resection of tumor.    PT Comments    Pt is progressing toward mobility goals and tolerated stair training this session however continues to demonstrate generalized weakness and decreased activity tolerance. Balance and cognition improving but pt will continue to benefit from further skilled PT services in the outpatient setting.   Follow Up Recommendations  Outpatient PT;Supervision/Assistance - 24 hour     Equipment Recommendations  None recommended by PT    Recommendations for Other Services       Precautions / Restrictions Precautions Precautions: Fall Restrictions Weight Bearing Restrictions: No    Mobility  Bed Mobility Overal bed mobility: Independent                Transfers Overall transfer level: Needs assistance Equipment used: None Transfers: Sit to/from Stand Sit to Stand: Supervision         General transfer comment: supervision for safety  Ambulation/Gait Ambulation/Gait assistance: Supervision Ambulation Distance (Feet): 200 Feet Assistive device: None Gait Pattern/deviations: Step-through pattern;Wide base of support     General Gait Details: slow, steady gait; supervision for safety; pt tolerated horizontal/vertical head turns with no LOB however c/o dizziness with cervical exension    Stairs Stairs: Yes Stairs assistance: Min guard Stair Management: One rail Right;Forwards;Step to pattern Number of Stairs: 10 General stair comments: cues for sequencing and step to pattern for safety; pt fatigue with stair management and required standing rest breaks with ascend and descend  Wheelchair Mobility    Modified Rankin (Stroke Patients Only)       Balance                                     Cognition Arousal/Alertness: Awake/alert Behavior During Therapy: WFL for tasks assessed/performed Overall Cognitive Status: Within Functional Limits for tasks assessed                      Exercises      General Comments General comments (skin integrity, edema, etc.): mother present in room      Pertinent Vitals/Pain Pain Assessment: No/denies pain    Home Living                      Prior Function            PT Goals (current goals can now be found in the care plan section) Acute Rehab PT Goals Patient Stated Goal: get stronger Progress towards PT goals: Progressing toward goals    Frequency    Min 3X/week      PT Plan Current plan remains appropriate    Co-evaluation             End of Session Equipment Utilized During Treatment: Gait belt Activity Tolerance: Patient tolerated treatment well Patient left: in bed;with call bell/phone within reach;with family/visitor present     Time: [id number] PT Time Calculation (min) (ACUTE ONLY): 13 min  Charges:  $Gait Training: 8-22 mins                    G Codes:      Salina April, PTA  Pager: [phone]   05/09/2016, 10:39 AM

## 2016-05-10 MED ORDER — LEVETIRACETAM 500 MG PO TABS: 500.0000 mg | ORAL_TABLET | Freq: Two times a day (BID) | ORAL | 1 refills | Status: DC

## 2016-05-10 MED ORDER — HYDROCODONE-ACETAMINOPHEN 5-325 MG PO TABS: 1.0000 | ORAL_TABLET | ORAL | 0 refills | Status: DC | PRN

## 2016-05-10 MED ORDER — DEXAMETHASONE 2 MG PO TABS
1.0000 mg | ORAL_TABLET | Freq: Two times a day (BID) | ORAL | Status: DC
  Administered 2016-05-10: 1 mg via ORAL
  Filled 2016-05-10: qty 1

## 2016-05-10 MED ORDER — DEXAMETHASONE 1 MG PO TABS: 1.0000 mg | ORAL_TABLET | Freq: Two times a day (BID) | ORAL | 0 refills | Status: DC

## 2016-05-10 NOTE — Discharge Summary (Signed)
Physician Discharge Summary  Patient ID: Donald Mayer MRN: [id number] DOB/AGE: 11-Dec-1985 30 y.o.  Admit date: 04/30/2016 Discharge date: 05/10/2016  Admission Diagnoses:Right frontal brain tumor  Discharge Diagnoses: The same Principal Problem:   Brain mass Active Problems:   Generalized weakness   Nonintractable headache   Discharged Condition: good  Hospital Course: Dr. Kathyrn Sheriff admitted the patient on 04/30/2016 with a diagnosis of a right frontal brain tumor.  He performed a frontal craniotomy on the patient on 05/04/2016 for resection of the tumor. A postoperative brain MRI was obtained without contrast because the patient refused IV contrast. The scan looked good.  The remainder of the patient's hospital course was unremarkable. He requested discharge to home on 05/10/2016. The patient was given written and oral discharge instructions. He was instructed to follow-up with Dr. Kathyrn Sheriff in a week to have staples removed and discussed the pathology which was pending at the time of discharge. All his questions were answered.    Consults: None Significant Diagnostic Studies: Brain MRI Treatments: Right craniotomy for resection of tumor Discharge Exam: Blood pressure 135/74, pulse 74, temperature 98 F (36.7 C), temperature source Oral, resp. rate 18, height 6\' 2"  (1.88 m), weight (!) 159.7 kg (352 lb 1.2 oz), SpO2 99 %. The patient is alert and oriented 3. His craniotomy incision is healing well. His strength and speech is normal.  Disposition: Home  Discharge Instructions    Ambulatory referral to Physical Therapy    Complete by:  As directed    Ambulatory referral to Physical Therapy    Complete by:  As directed    Please call mother for appointment: [phone] [name]   Ambulatory referral to Speech Therapy    Complete by:  As directed    Call MD for:  difficulty breathing, headache or visual disturbances    Complete by:  As directed    Call MD for:   extreme fatigue    Complete by:  As directed    Call MD for:  hives    Complete by:  As directed    Call MD for:  persistant dizziness or light-headedness    Complete by:  As directed    Call MD for:  persistant nausea and vomiting    Complete by:  As directed    Call MD for:  redness, tenderness, or signs of infection (pain, swelling, redness, odor or green/yellow discharge around incision site)    Complete by:  As directed    Call MD for:  severe uncontrolled pain    Complete by:  As directed    Call MD for:  temperature >100.4    Complete by:  As directed    Diet - low sodium heart healthy    Complete by:  As directed    Discharge instructions    Complete by:  As directed    Call [phone] for a followup appointment. Take a stool softener while you are using pain medications.   Driving Restrictions    Complete by:  As directed    Do not drive for 2 weeks.   Increase activity slowly    Complete by:  As directed    Lifting restrictions    Complete by:  As directed    Do not lift more than 5 pounds. No excessive bending or twisting.   May shower / Bathe    Complete by:  As directed    He may shower after the pain she is removed 3 days after surgery. Leave  the incision alone.   No dressing needed    Complete by:  As directed        Medication List    STOP taking these medications   ibuprofen 800 MG tablet Commonly known as:  ADVIL,MOTRIN     TAKE these medications   acetaminophen 500 MG tablet Commonly known as:  TYLENOL Take 1 tablet (500 mg total) by mouth every 6 (six) hours as needed.   dexamethasone 1 MG tablet Commonly known as:  DECADRON Take 1 tablet (1 mg total) by mouth every 12 (twelve) hours.   HYDROcodone-acetaminophen 5-325 MG tablet Commonly known as:  NORCO/VICODIN Take 1 tablet by mouth every 4 (four) hours as needed for moderate pain.   levETIRAcetam 500 MG tablet Commonly known as:  KEPPRA Take 1 tablet (500 mg total) by mouth 2 (two)  times daily.      Follow-up Information    [facility] Follow up.   Specialty:  Rehabilitation Why:  They will contact you for the first visit. Contact information: [id number] [place] [phone]          Signed: Ophelia Charter 05/10/2016, 9:44 AM

## 2016-05-11 ENCOUNTER — Telehealth: Payer: Self-pay | Admitting: Medical

## 2016-05-11 ENCOUNTER — Ambulatory Visit (INDEPENDENT_AMBULATORY_CARE_PROVIDER_SITE_OTHER): Payer: Managed Care, Other (non HMO) | Admitting: Medical

## 2016-05-11 ENCOUNTER — Encounter: Payer: Self-pay | Admitting: Medical

## 2016-05-11 VITALS — BP 120/80 | HR 77 | Temp 98.0°F | Ht 73.0 in | Wt 353.0 lb

## 2016-05-11 DIAGNOSIS — D496 Neoplasm of unspecified behavior of brain: Secondary | ICD-10-CM

## 2016-05-11 NOTE — Telephone Encounter (Signed)
Message routed to PCP.

## 2016-05-11 NOTE — Telephone Encounter (Addendum)
Patient scheduled for surgery follow up today and checking on the status of FMLA paperwork.

## 2016-05-11 NOTE — Telephone Encounter (Signed)
Please fax both disability and fml form I filled out. Please send first thing on 05-12-2106. Confirm that they got the fax.

## 2016-05-11 NOTE — Progress Notes (Signed)
Subjective:    Patient ID: Rowan Blase, male    DOB: 04-Mar-1986, 30 y.o.   MRN: [id number]  HPI  Pt in for follow up post surgery he had rt frontal craniotomy. Tumor was removed.   He has follow up with neurosurgeon coming up sometime next week.   Pt is feeling better overall. He has some problems processing things. Pt had OT and PT in hospital. Pt took a test and did not do process things well. Hard time processing and repeating/remembering things. Pt works typing and Therapist, art.  Pt was very weak on day I saw him.  He could barely stand on day I saw him. Then sent to hospital. Now pt post surger states much better in terms of overall strength is better. Pt states faint ha. Pt had faint mild eyelid swelling this am. Pt called surgeon office and they stated can be normal post surgery.   Pt is on kepra and decadron. On hydrocodone if needed.  Pt needs me to fill out disability and fmla paperwork.      Review of Systems  Constitutional: Negative for chills, fatigue and fever.  HENT: Negative for congestion, facial swelling and sinus pressure.   Respiratory: Negative for cough, shortness of breath and wheezing.   Cardiovascular: Negative for chest pain and palpitations.  Gastrointestinal: Negative for abdominal pain, diarrhea, nausea and vomiting.  Musculoskeletal: Negative for back pain.  Neurological: Positive for headaches. Negative for dizziness, tremors, seizures, syncope, speech difficulty, weakness, light-headedness and numbness.       Very faint low level ha since surgery and mild cognition problems reported since surgery.  Hematological: Negative for adenopathy. Does not bruise/bleed easily.  Psychiatric/Behavioral: Negative for behavioral problems, confusion, dysphoric mood, hallucinations and suicidal ideas. The patient is not nervous/anxious.     Past Medical History:  Diagnosis Date  . Back injuries      Social History   Social History  . Marital  status: Single    Spouse name: N/A  . Number of children: N/A  . Years of education: N/A   Occupational History  . Not on file.   Social History Main Topics  . Smoking status: Unknown If Ever Smoked  . Smokeless tobacco: Never Used  . Alcohol use Yes     Comment: occasional  . Drug use: No  . Sexual activity: Not on file   Other Topics Concern  . Not on file   Social History Narrative  . No narrative on file    Past Surgical History:  Procedure Laterality Date  . APPLICATION OF CRANIAL NAVIGATION N/A 05/04/2016   Procedure: APPLICATION OF CRANIAL NAVIGATION;  Surgeon: Consuella Lose, MD;  Location: [place];  Service: Neurosurgery;  Laterality: N/A;  . ORIF WRIST FRACTURE Left 12/15/2012   Procedure: OPEN REDUCTION INTERNAL FIXATION (ORIF) WRIST FRACTURE; Left.  Reduction, repair, and reconnstruction of perilunate fracture dislocation;  Surgeon: Roseanne Kaufman, MD;  Location: [place];  Service: Orthopedics;  Laterality: Left;  . STERIOTACTIC STIMULATOR INSERTION Right 05/04/2016   Procedure: STERIOTACTIC RIGHT FRONTAL CRANIOTOMY with BrainLab;  Surgeon: Consuella Lose, MD;  Location: [place];  Service: Neurosurgery;  Laterality: Right;  right    Family History  Problem Relation Age of Onset  . Breast cancer Maternal Grandmother   . Lung cancer Maternal Grandfather   . Brain cancer Neg Hx     No Known Allergies  Current Outpatient Prescriptions on File Prior to Visit  Medication Sig Dispense Refill  . acetaminophen (TYLENOL)  500 MG tablet Take 1 tablet (500 mg total) by mouth every 6 (six) hours as needed. 30 tablet 0  . dexamethasone (DECADRON) 1 MG tablet Take 1 tablet (1 mg total) by mouth every 12 (twelve) hours. 6 tablet 0  . HYDROcodone-acetaminophen (NORCO/VICODIN) 5-325 MG tablet Take 1 tablet by mouth every 4 (four) hours as needed for moderate pain. 30 tablet 0  . levETIRAcetam (KEPPRA) 500 MG tablet Take 1 tablet (500 mg total) by mouth 2 (two) times daily. 60  tablet 1   No current facility-administered medications on file prior to visit.     BP 120/80 (BP Location: Left Arm, Patient Position: Sitting, Cuff Size: Normal)   Pulse 77   Temp 98 F (36.7 C) (Oral)   Ht 6\' 1"  (1.854 m)   Wt (!) 353 lb (160.1 kg)   SpO2 98%   BMI 46.57 kg/m       Objective:   Physical Exam  General Mental Status- Alert. General Appearance- Not in acute distress.   Skin General: Color- Normal Color. Moisture- Normal Moisture.  Neck Carotid Arteries- Normal color. Moisture- Normal Moisture. No carotid bruits. No JVD.  Chest and Lung Exam Auscultation: Breath Sounds:-Normal.  Cardiovascular Auscultation:Rythm- Regular. Murmurs & Other Heart Sounds:Auscultation of the heart reveals- No Murmurs.  Abdomen Inspection:-Inspeection Normal. Palpation/Percussion:Note:No mass. Palpation and Percussion of the abdomen reveal- Non Tender, Non Distended + BS, no rebound or guarding.    Neurologic Cranial Nerve exam:- CN III-XII intact(No nystagmus), symmetric smile. Romberg Exam:- Negative.  Finger to Nose:- Normal/Intact Strength:- 5/5 equal and symmetric strength both upper and lower extremities.  Cranium- rt side staples placed over craniotomy wound. Wound looks clean. No redness or discharge.      Assessment & Plan:  I think you area doing well post surgery. Do the  PT, OT(therapies) and attend specialist appointments.   If you have any new/severe neurologic signs or symptoms then ED evaluation.  I filled out both forms today based on information I have. Please present copies of blank  paperwork to specialist so they can modify/specify. Will ask MA Barnet Pall to fax over forms first thing tomorrow morning  Follow up with me in 2 weeks or as needed  Time taken today in office to fill out both forms took at least 25 minutes by themselves. In addition to more time for  physical exam and interview.

## 2016-05-11 NOTE — Patient Instructions (Addendum)
I think you area doing well post surgery. Do the  PT, OT(therapies) and attend specialist appointments.   If you have any new/severe neurologic signs or symptoms then ED evaluation.  I filled out both forms today based on information I have. Please present copies of blank  paperwork to specialist so they can modify/specify. Will ask MA Barnet Pall to fax over forms first thing tomorrow morning  Follow up with me in 2 weeks or as needed  Time taken today in office to fill out both forms took at least 25 minutes by themselves. In addition to more time for  physical exam and interview.

## 2016-05-11 NOTE — Progress Notes (Signed)
Pre visit review using our clinic review tool, if applicable. No additional management support is needed unless otherwise documented below in the visit note. 

## 2016-05-12 NOTE — Telephone Encounter (Signed)
Forms faxed to [facility] on 05/12/16.   I also spoke with the patient and advised him that I had sent over the paperwork and received the confirmation on 05/12/16. Pt did not have any further questions.

## 2016-05-14 ENCOUNTER — Ambulatory Visit (HOSPITAL_COMMUNITY): Payer: Managed Care, Other (non HMO) | Attending: Neurosurgery | Admitting: Physical Therapy

## 2016-05-14 DIAGNOSIS — R262 Difficulty in walking, not elsewhere classified: Secondary | ICD-10-CM | POA: Diagnosis present

## 2016-05-14 DIAGNOSIS — R29898 Other symptoms and signs involving the musculoskeletal system: Secondary | ICD-10-CM | POA: Insufficient documentation

## 2016-05-14 DIAGNOSIS — R2681 Unsteadiness on feet: Secondary | ICD-10-CM

## 2016-05-14 DIAGNOSIS — M6281 Muscle weakness (generalized): Secondary | ICD-10-CM | POA: Insufficient documentation

## 2016-05-14 NOTE — Patient Instructions (Signed)
   LAQ/ Long Arc Quad/ Knee Extension with Band  Sit upright at the edge of a seat with foot looped in elastic band. With control, kick the foot out and up to straighten the knee.   Your knee should be pointed towards the ceiling and in line with your foot. Your foot should be flexed at the ankle and the sole of your foot should not turn in or out. Return to starting position with the same level of control.  Repeat at least 10 times each leg with red band, 2-3 times per day.    SIT TO STAND - NO SUPPORT  Start by scooting close to the front of the chair.  Next, lean forward at your trunk and reach forward with your arms and rise to standing without using your hands to push off from the chair or other object.   Use your arms as a counter-balance by reaching forward when in sitting and lower them as you approach standing.   Repeat 10 times, 2-3 times per day.    REGULAR WALKING PROGRAM  Timing yourself on your watch, walk continuously for 1 minute 4-5 times per day. Do this for 4 days, and then add 1 minute to your time, so you are walking continuously for 2 minutes 4-5 times per day.  Add 1 minute to how long you are walking every 4 days.

## 2016-05-14 NOTE — Therapy (Signed)
[facility] [address] [place], [place], [address] Phone: [phone]   Fax:  [phone]  Physical Therapy Evaluation  Patient Details  Name: Donald Mayer MRN: [id number] Date of Birth: [birth year] Referring Provider: Kary Kos   Encounter Date: 05/14/2016      PT End of Session - 05/14/16 1828    Visit Number 1   Number of Visits 8   Date for PT Re-Evaluation 06/11/16   Authorization Type [employer] Managed    Authorization Time Period 05/14/16 to 06/13/16   PT Start Time 1302   PT Stop Time 1345   PT Time Calculation (min) 43 min   Equipment Utilized During Treatment Gait belt   Activity Tolerance Patient tolerated treatment well   Behavior During Therapy [facility] for tasks assessed/performed      Past Medical History:  Diagnosis Date  . Back injuries     Past Surgical History:  Procedure Laterality Date  . APPLICATION OF CRANIAL NAVIGATION N/A 05/04/2016   Procedure: APPLICATION OF CRANIAL NAVIGATION;  Surgeon: Consuella Lose, MD;  Location: [facility];  Service: Neurosurgery;  Laterality: N/A;  . ORIF WRIST FRACTURE Left 12/15/2012   Procedure: OPEN REDUCTION INTERNAL FIXATION (ORIF) WRIST FRACTURE; Left.  Reduction, repair, and reconnstruction of perilunate fracture dislocation;  Surgeon: Roseanne Kaufman, MD;  Location: [facility];  Service: Orthopedics;  Laterality: Left;  . STERIOTACTIC STIMULATOR INSERTION Right 05/04/2016   Procedure: STERIOTACTIC RIGHT FRONTAL CRANIOTOMY with BrainLab;  Surgeon: Consuella Lose, MD;  Location: [facility];  Service: Neurosurgery;  Laterality: Right;  right    There were no vitals filed for this visit.       Subjective Assessment - 05/14/16 1307    Subjective Patient states that the day after his birthday, he was going to get something to eat wtih a friend; he got out of the car and collapsed, hitting his head. They took him to the hospital but everything looked normal. He then went to his PCP  that Thursday, who sent him for a CT and MRI, which revealed a brain tumor. He was in the hospital up to the date of his surgery. He got PT in the hospital; he did not get any HHPT, was sent to OPPT. He doesn't do a lot, he lays in the bed; his family is scared for him to do things as he gets tired easily. No falls or close calls recently, he feels confident about his balance.    Pertinent History brain mass, history of back injuries, steriotactic stimulator insertion R frontal lobe    How long can you sit comfortably? unlimited    How long can you stand comfortably? not sure    How long can you walk comfortably? few hundred feet, gets tired fairly quickly    Patient Stated Goals get stronger, get energy back, get back to PLOF    Currently in Pain? No/denies            [facility] PT Assessment - 05/14/16 0001      Assessment   Medical Diagnosis brain tumor    Referring Provider Kary Kos    Onset Date/Surgical Date 04/28/16   Next MD Visit Neurologist on Wednesday    Prior Therapy PT in the hospital      Precautions   Precaution Comments fall, R frontal lobe stimulator; cannot lift more than 5#      Balance Screen   Has the patient fallen in the past 6 months Yes   How  many times? 1   Has the patient had a decrease in activity level because of a fear of falling?  No   Is the patient reluctant to leave their home because of a fear of falling?  No     Prior Function   Level of Independence Independent;Independent with basic ADLs;Independent with gait;Independent with transfers;Other (comment)   Vocation Full time employment   Vocation Requirements worked from home before all of this happened      Strength   Right Hip Flexion 4/5   Right Hip Extension 3/5   Right Hip ABduction 4/5   Left Hip Flexion 4/5   Left Hip Extension 3/5   Left Hip ABduction 4+/5   Right Knee Flexion 4-/5   Right Knee Extension 4-/5   Left Knee Flexion 4-/5   Left Knee Extension 4-/5   Right Ankle  Dorsiflexion 4+/5   Left Ankle Dorsiflexion 4/5     Ambulation/Gait   Gait Comments reduced ankle DF, reduced gait speed, functional activity tolerance      6 minute walk test results    Aerobic Endurance Distance Walked 360   Endurance additional comments 3MWT, no device, stopped due to light-headedness      Dynamic Gait Index   Level Surface Normal   Change in Gait Speed Mild Impairment   Gait with Horizontal Head Turns Normal   Gait with Vertical Head Turns Normal   Gait and Pivot Turn Moderate Impairment   Step Over Obstacle Mild Impairment   Step Around Obstacles Mild Impairment   Steps Normal   Total Score 19     High Level Balance   High Level Balance Comments SLS 10-15 seconds                            PT Education - 05/14/16 1827    Education provided Yes   Education Details prognosis, HEP, POC, importance of regular exercise and activity on recovery after deconditioning in hospital and after surgery    Person(s) Educated Patient   Methods Explanation;Handout   Comprehension Verbalized understanding;Need further instruction          PT Short Term Goals - 05/14/16 1833      PT SHORT TERM GOAL #1   Title Patient to be able to perform 5 minutes of continuous activity with no fatigue or light-headedness to show improving functional activity tolerance and general function    Time 2   Period Weeks   Status New     PT SHORT TERM GOAL #2   Title Patient to be able to verbally state the effect of regular, graded exercise on physical recovery in order to faciltiate good participation in PT and independent exercise program    Time 2   Period Weeks   Status New     PT SHORT TERM GOAL #3   Title Patient to be independent in correctly and consistently performing appropriate HEP, to be updated PRN    Time 1   Period Weeks   Status New           PT Long Term Goals - 05/14/16 1834      PT LONG TERM GOAL #1   Title Patient to demonstrate  functional strength 5/5 in order to improve mobility, functional activity tolerance, and balance    Time 4   Period Weeks   Status New     PT LONG TERM GOAL #2   Title Patient to  be able to tolerate at least 20 minutes of continuous physical activity in order to show improvement in functional activity tolerance and enhance community access    Time 4   Period Weeks   Status New     PT LONG TERM GOAL #3   Title Patient to score full points (24/24) on DGI in order to show improved balance and dynamic mobility    Time 4   Period Weeks   Status New     PT LONG TERM GOAL #4   Title Patient to be independent in regular aerobic exercise program, at least 20 minutes in duration and at least 3 days per week, in order to maintain functional gains and improve general health status    Time 4   Period Weeks   Status New               Plan - 05/14/16 1829    Clinical Impression Statement Patient arrives after surgery for brain tumor with, according to resources available to DPT, possible insertion of stimulator to frontal lobe; he reports that since surgery he really has not done a lot and has stayed in bed most of the time, stating "rest is recovery!". He has become dizzy and light headed while walking short distances even to get into the store with his family. Upon examination, vitals (BP, O2 sats, and HR) are WFL at rest and with activity however HR does increase to 115-120BPM with activity, recovers with rest. He also demonstrates significant functional weakness as well as mild unsteadiness as evidenced by difficulty with tasks on DGI; a significant factor seems to be his general deconditioning secondary to recent health problems. He will benefit from skilled PT services to address functional limitations and assist in return to optimal level of function.    Rehab Potential Good   Clinical Impairments Affecting Rehab Potential (+) age, in generally good health besides recent surgery/tumor; (-)  possible fluctuating levels of motivation to particiapte with PT, obesity, sedentary lifestyle    PT Frequency 2x / week   PT Duration 4 weeks   PT Treatment/Interventions ADLs/Self Care Home Management;Biofeedback;Gait training;Stair training;Functional mobility training;Therapeutic activities;Therapeutic exercise;Balance training;Neuromuscular re-education;Patient/family education;Manual techniques;Energy conservation;Taping   PT Next Visit Plan review HEP and goals; focus on functional strength, endurance, functional activity tolerance. Encourage regular activity throughout his day as tolerated. Monitor vitals.    PT Home Exercise Plan Eval: regular walking program, LAQS with red TB, no UE STS    Consulted and Agree with Plan of Care Patient      Patient will benefit from skilled therapeutic intervention in order to improve the following deficits and impairments:  Decreased coordination, Decreased mobility, Postural dysfunction, Decreased activity tolerance, Decreased strength, Impaired perceived functional ability, Decreased balance  Visit Diagnosis: Muscle weakness (generalized) - Plan: PT plan of care cert/re-cert  Unsteadiness on feet - Plan: PT plan of care cert/re-cert  Difficulty in walking, not elsewhere classified - Plan: PT plan of care cert/re-cert  Other symptoms and signs involving the musculoskeletal system - Plan: PT plan of care cert/re-cert     Problem List Patient Active Problem List   Diagnosis Date Noted  . Brain mass 04/30/2016  . Generalized weakness 04/30/2016  . Nonintractable headache     Deniece Ree PT, DPT [facility] [address] [place], [place], [address] Phone: [phone]   Fax:  [phone]  Name: Donald Mayer MRN: [id number] Date of Birth: Oct 16, 1985

## 2016-05-15 ENCOUNTER — Telehealth: Payer: Self-pay | Admitting: Medical

## 2016-05-15 NOTE — Telephone Encounter (Signed)
Caller name:Renee W/ [employer] Relationship to patient: Can be reached:[phone] Pharmacy:  Reason for call:Requesting call back, would like to speak to someone regarding disability paperwork, requesting to know which neurologist the patient has seen

## 2016-05-19 ENCOUNTER — Ambulatory Visit (HOSPITAL_COMMUNITY): Payer: Managed Care, Other (non HMO) | Admitting: Speech Pathology

## 2016-05-19 ENCOUNTER — Ambulatory Visit (HOSPITAL_COMMUNITY): Payer: Managed Care, Other (non HMO) | Attending: Neurosurgery | Admitting: Physical Therapy

## 2016-05-19 DIAGNOSIS — R41841 Cognitive communication deficit: Secondary | ICD-10-CM | POA: Diagnosis present

## 2016-05-19 DIAGNOSIS — R2681 Unsteadiness on feet: Secondary | ICD-10-CM | POA: Diagnosis present

## 2016-05-19 DIAGNOSIS — M6281 Muscle weakness (generalized): Secondary | ICD-10-CM | POA: Diagnosis not present

## 2016-05-19 DIAGNOSIS — R262 Difficulty in walking, not elsewhere classified: Secondary | ICD-10-CM | POA: Diagnosis present

## 2016-05-19 DIAGNOSIS — R29898 Other symptoms and signs involving the musculoskeletal system: Secondary | ICD-10-CM

## 2016-05-19 NOTE — Therapy (Signed)
[facility] [address] [place], [place], [address] Phone: [phone]   Fax:  [phone]  Physical Therapy Treatment  Patient Details  Name: ROSALIE AKEY MRN: [id number] Date of Birth: 06-02-1986 Referring Provider: Kary Kos   Encounter Date: 05/19/2016      PT End of Session - 05/19/16 1519    Visit Number 2   Number of Visits 8   Date for PT Re-Evaluation 06/11/16   Authorization Type [employer] Managed    Authorization Time Period 05/14/16 to 06/13/16   PT Start Time 1430   PT Stop Time 1515   PT Time Calculation (min) 45 min   Equipment Utilized During Treatment Gait belt   Activity Tolerance Patient tolerated treatment well   Behavior During Therapy [facility] for tasks assessed/performed      Past Medical History:  Diagnosis Date  . Back injuries     Past Surgical History:  Procedure Laterality Date  . APPLICATION OF CRANIAL NAVIGATION N/A 05/04/2016   Procedure: APPLICATION OF CRANIAL NAVIGATION;  Surgeon: Consuella Lose, MD;  Location: [facility];  Service: Neurosurgery;  Laterality: N/A;  . ORIF WRIST FRACTURE Left 12/15/2012   Procedure: OPEN REDUCTION INTERNAL FIXATION (ORIF) WRIST FRACTURE; Left.  Reduction, repair, and reconnstruction of perilunate fracture dislocation;  Surgeon: Roseanne Kaufman, MD;  Location: [facility];  Service: Orthopedics;  Laterality: Left;  . STERIOTACTIC STIMULATOR INSERTION Right 05/04/2016   Procedure: STERIOTACTIC RIGHT FRONTAL CRANIOTOMY with BrainLab;  Surgeon: Consuella Lose, MD;  Location: [facility];  Service: Neurosurgery;  Laterality: Right;  right    There were no vitals filed for this visit.      Subjective Assessment - 05/19/16 1445    Subjective Pt states he has not had any issues with his HR since stopping the prednisone.  Returns to surgeon tomorrow for check up, results from pathology and staple removal.  Reports completing more activities at home since last visit.   Currently in  Pain? No/denies                         [facility] Adult PT Treatment/Exercise - 05/19/16 0001      Knee/Hip Exercises: Aerobic   Other Aerobic 3 minute walk initially, completing 3 laps     Knee/Hip Exercises: Standing   Heel Raises 15 reps   Heel Raises Limitations toeraises 15 reps   Forward Lunges Both;15 reps   Forward Lunges Limitations onto 4" box   Side Lunges Both;15 reps   Side Lunges Limitations onto 4" box   Hip Abduction Both;15 reps   Hip Extension Both;15 reps     Knee/Hip Exercises: Seated   Sit to Sand 10 reps (reviewed for HEP)                PT Education - 05/19/16 1518    Education provided Yes   Education Details reviewed HEP and goals per initial evaluation,  Given copy of evaluation and updated HEP   Person(s) Educated Patient   Methods Explanation;Demonstration;Tactile cues;Verbal cues;Handout   Comprehension Verbalized understanding;Returned demonstration;Verbal cues required;Tactile cues required;Need further instruction          PT Short Term Goals - 05/14/16 1833      PT SHORT TERM GOAL #1   Title Patient to be able to perform 5 minutes of continuous activity with no fatigue or light-headedness to show improving functional activity tolerance and general function    Time 2   Period Weeks  Status New     PT SHORT TERM GOAL #2   Title Patient to be able to verbally state the effect of regular, graded exercise on physical recovery in order to faciltiate good participation in PT and independent exercise program    Time 2   Period Weeks   Status New     PT SHORT TERM GOAL #3   Title Patient to be independent in correctly and consistently performing appropriate HEP, to be updated PRN    Time 1   Period Weeks   Status New           PT Long Term Goals - 05/14/16 1834      PT LONG TERM GOAL #1   Title Patient to demonstrate functional strength 5/5 in order to improve mobility, functional activity tolerance, and  balance    Time 4   Period Weeks   Status New     PT LONG TERM GOAL #2   Title Patient to be able to tolerate at least 20 minutes of continuous physical activity in order to show improvement in functional activity tolerance and enhance community access    Time 4   Period Weeks   Status New     PT LONG TERM GOAL #3   Title Patient to score full points (24/24) on DGI in order to show improved balance and dynamic mobility    Time 4   Period Weeks   Status New     PT LONG TERM GOAL #4   Title Patient to be independent in regular aerobic exercise program, at least 20 minutes in duration and at least 3 days per week, in order to maintain functional gains and improve general health status    Time 4   Period Weeks   Status New               Plan - 05/19/16 1504    Clinical Impression Statement Focused on functional strengthening and activity tolerance.  pt able to walk 3 minutes straight approx 680 feet without c/o dizziness or SOB.  HR was 120 bpm following ambulation.  Progressed with standing functional therex with minimal fatigue noted.  Two rest breaks during session with HR no higher than 100 bpm.  Updated HEP as reported current HEP too easy.     Rehab Potential Good   Clinical Impairments Affecting Rehab Potential (+) age, in generally good health besides recent surgery/tumor; (-) possible fluctuating levels of motivation to particiapte with PT, obesity, sedentary lifestyle    PT Frequency 2x / week   PT Duration 4 weeks   PT Treatment/Interventions ADLs/Self Care Home Management;Biofeedback;Gait training;Stair training;Functional mobility training;Therapeutic activities;Therapeutic exercise;Balance training;Neuromuscular re-education;Patient/family education;Manual techniques;Energy conservation;Taping   PT Next Visit Plan continue focus on functional strength, endurance, functional activity tolerance. monitor vitals.    PT Home Exercise Plan Eval: regular walking program,  LAQS with red TB, no UE STS  12/5:  standing heelraises, functional squats, hip abduction, hip extension   Consulted and Agree with Plan of Care Patient      Patient will benefit from skilled therapeutic intervention in order to improve the following deficits and impairments:  Decreased coordination, Decreased mobility, Postural dysfunction, Decreased activity tolerance, Decreased strength, Impaired perceived functional ability, Decreased balance  Visit Diagnosis: Muscle weakness (generalized)  Unsteadiness on feet  Difficulty in walking, not elsewhere classified  Other symptoms and signs involving the musculoskeletal system     Problem List Patient Active Problem List   Diagnosis Date Noted  .  Brain mass 04/30/2016  . Generalized weakness 04/30/2016  . Nonintractable headache     Teena Irani, PTA/CLT [phone]  05/19/2016, 3:20 PM  [facility] [address] [place], [place], [address] Phone: [phone]   Fax:  [phone]  Name: ARMAS VINTON MRN: [id number] Date of Birth: 1986-02-15

## 2016-05-19 NOTE — Patient Instructions (Signed)
FUNCTIONAL MOBILITY: Squat    shoulder-width on floor. Bend hips and knees. Keep back straight. Do not allow knees to bend past toes. Squeeze glutes and quads to stand._10__ reps per set, _2__ sets   EXTENSION: Standing (Active)    Stand, both feet flat. Draw right leg behind body as far as possible. Complete __10__ repetitions. Perform __2_ sessions per day.   ABDUCTION: Standing (Active)    Stand, feet flat. Lift right leg out to side. Complete _10__ repetitions. Perform _2__ sessions per day.     Heel Raise (Calf Strength / Balance)    Stand with support. Rise up on tiptoes, breathing out through pursed lips. Hold position to count of _2__. Return slowly, breathing in. Repeat _10__ times per session. Do__2_ sessions per day.

## 2016-05-21 ENCOUNTER — Ambulatory Visit (HOSPITAL_COMMUNITY): Payer: Managed Care, Other (non HMO) | Admitting: Physical Therapy

## 2016-05-21 ENCOUNTER — Telehealth (HOSPITAL_COMMUNITY): Payer: Self-pay | Admitting: Medical

## 2016-05-21 NOTE — Telephone Encounter (Signed)
I attempted to reach the VM of Renee and the recording state that she is out of the office and to call the main number to [facility] [place]. Will try again later.

## 2016-05-21 NOTE — Telephone Encounter (Signed)
05/21/16 pt called to cx but no reason was given

## 2016-05-26 ENCOUNTER — Ambulatory Visit (HOSPITAL_COMMUNITY): Payer: Managed Care, Other (non HMO)

## 2016-05-26 ENCOUNTER — Ambulatory Visit (HOSPITAL_COMMUNITY): Payer: Managed Care, Other (non HMO) | Admitting: Speech Pathology

## 2016-05-26 ENCOUNTER — Encounter (HOSPITAL_COMMUNITY): Payer: Self-pay | Admitting: Speech Pathology

## 2016-05-26 DIAGNOSIS — R29898 Other symptoms and signs involving the musculoskeletal system: Secondary | ICD-10-CM

## 2016-05-26 DIAGNOSIS — R41841 Cognitive communication deficit: Secondary | ICD-10-CM

## 2016-05-26 DIAGNOSIS — R2681 Unsteadiness on feet: Secondary | ICD-10-CM

## 2016-05-26 DIAGNOSIS — M6281 Muscle weakness (generalized): Secondary | ICD-10-CM | POA: Diagnosis not present

## 2016-05-26 DIAGNOSIS — R262 Difficulty in walking, not elsewhere classified: Secondary | ICD-10-CM

## 2016-05-26 NOTE — Therapy (Signed)
[facility] [place], [place], [address] Phone: [phone]   Fax:  [phone]  Speech Language Pathology Evaluation  Patient Details  Name: Donald Mayer MRN: [id number] Date of Birth: Apr 18, 1986 Referring Provider: Kary Kos  Encounter Date: 05/26/2016      End of Session - 05/26/16 1631    Visit Number 1   Number of Visits 8   Date for SLP Re-Evaluation 06/27/15   Authorization Type Aetna   SLP Start Time 1120   SLP Stop Time  1200   SLP Time Calculation (min) 40 min   Activity Tolerance Patient tolerated treatment well      Past Medical History:  Diagnosis Date  . Back injuries     Past Surgical History:  Procedure Laterality Date  . APPLICATION OF CRANIAL NAVIGATION N/A 05/04/2016   Procedure: APPLICATION OF CRANIAL NAVIGATION;  Surgeon: Consuella Lose, MD;  Location: [facility];  Service: Neurosurgery;  Laterality: N/A;  . ORIF WRIST FRACTURE Left 12/15/2012   Procedure: OPEN REDUCTION INTERNAL FIXATION (ORIF) WRIST FRACTURE; Left.  Reduction, repair, and reconnstruction of perilunate fracture dislocation;  Surgeon: Roseanne Kaufman, MD;  Location: [facility];  Service: Orthopedics;  Laterality: Left;  . STERIOTACTIC STIMULATOR INSERTION Right 05/04/2016   Procedure: STERIOTACTIC RIGHT FRONTAL CRANIOTOMY with BrainLab;  Surgeon: Consuella Lose, MD;  Location: [facility];  Service: Neurosurgery;  Laterality: Right;  right    There were no vitals filed for this visit.      Subjective Assessment - 05/26/16 1200    Subjective "My memory."   Special Tests MOCA 27/30   Currently in Pain? No/denies            SLP Evaluation [facility] - 05/26/16 1200      SLP Visit Information   SLP Received On 05/26/16   Referring Provider Kary Kos   Onset Date 04/30/2016   Medical Diagnosis right frontal brain tumor     Subjective   Subjective "My memory isn't good."   Patient/Family Stated Goal "I need to work on my  memory."     General Information   HPI Donald Mayer is a 30 yo male who was referred by Dr. 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   Behavioral/Cognition Alert and cooperative   Mobility Status ambulatory     Prior Functional Status   Cognitive/Linguistic Baseline Within functional limits   Type of Home Apartment    Lives With Alone  currently staying with family   Available Support Family   Vocation Full time employment     Pain Assessment   Pain Assessment No/denies pain     Cognition   Overall Cognitive Status Impaired/Different from baseline   Area of Impairment Attention;Memory   Current Attention Level Selective   Memory Decreased short-term memory   Memory Comments 3/5 5-item recall after 5 minutes   Attention Alternating   Alternating Attention Impaired   Alternating Attention Impairment Verbal complex;Functional complex   Memory Impaired   Memory Impairment Retrieval deficit;Decreased short term memory;Prospective memory   Decreased Short Term Memory Verbal complex;Functional complex   Awareness Appears intact   Problem Solving Appears intact   Executive Function Sequencing;Organizing   Sequencing Impaired   Sequencing Impairment Verbal complex;Functional complex   Organizing Impaired   Organizing Impairment Verbal complex;Functional complex   Behaviors Poor frustration tolerance     Auditory Comprehension   Overall Auditory Comprehension Impaired   Yes/No Questions Within Functional  Limits   Commands Within Functional Limits   Conversation Complex   Other Conversation Comments extra processing time and repetition    Interfering Components Attention;Processing speed;Working Mining engineer;Extra processing time;Pausing     Visual Recognition/Discrimination   Discrimination Within Function Limits  Pt reports change in vision and has appt next week for visio     Reading  Comprehension   Reading Status Not tested     Expression   Primary Mode of Expression Verbal     Verbal Expression   Overall Verbal Expression Impaired   Initiation No impairment   Level of Generative/Spontaneous Verbalization Conversation   Repetition No impairment   Naming Impairment   Responsive Not tested   Confrontation 75-100% accurate   Convergent 75-100% accurate   Divergent 75-100% accurate   Pragmatics No impairment   Interfering Components Attention   Non-Verbal Means of Communication Not applicable     Written Expression   Dominant Hand Left   Written Expression Within Functional Limits     Oral Motor/Sensory Function   Overall Oral Motor/Sensory Function Appears within functional limits for tasks assessed     Motor Speech   Overall Motor Speech Appears within functional limits for tasks assessed   Respiration Within functional limits   Phonation Normal   Resonance Within functional limits   Articulation Within functional limitis   Intelligibility Intelligible   Motor Planning Witnin functional limits   Motor Speech Errors Not applicable   Phonation WFL     Standardized Assessments   Standardized Assessments  Montreal Cognitive Assessment (MOCA)   Montreal Cognitive Assessment (MOCA)  27/30           SLP Short Term Goals - 05/26/16 1639      SLP SHORT TERM GOAL #1   Title Pt will complete functional memory tasks with 90% acc with min assist and use of strategies as needed.   Baseline 60%   Time 4   Period Weeks   Status New     SLP SHORT TERM GOAL #2   Title Pt will complete moderately complex thought organization and planning tasks with 100% acc with min assist and use of strategies as needed.   Baseline moderate assist needed to complete   Time 4   Period Weeks   Status New     SLP SHORT TERM GOAL #3   Title Pt will complete alternating and divided attention tasks in dynamic environment with 90% acc with use of strategies as needed.    Baseline 75% acc   Time 4   Period Weeks     SLP SHORT TERM GOAL #4   Title Pt will answer moderately complex "Wh" questions with 95% acc for content and accuracy with min assist and use of strategies.   Baseline Pt does not always fully answer questions/complete his thoughts   Time 4   Period Weeks   Status New          SLP Long Term Goals - 05/26/16 1644      SLP LONG TERM GOAL #1   Title Pt will be able to complete high level attention, planning, and thought organization tasks to [facility] with use of strategies.   Baseline Min impairment   Time 4   Period Weeks   Status New          Plan - 05/26/16 1632    Clinical Impression Statement Pt presents with mild cognitive linguistic deficits characterized by decreased working and short term memory,  decreased attention skills for complex functional tasks and communication, decreased thought organization and multitasking negatively impacting indepedence with executive skills and communication skills. Recommend skilled SLP services to address deficits in order to maximize recovery, increase independence, and decrease burden of care. Pt is highly motivated to improve cognitive communication skills and has good family support.    Speech Therapy Frequency 2x / week   Duration 4 weeks   Treatment/Interventions Cognitive reorganization;SLP instruction and feedback;Compensatory strategies;Patient/family education;Cueing hierarchy;Functional tasks;Internal/external aids;Environmental controls   Potential to Achieve Goals Good   Potential Considerations Ability to learn/carryover information   SLP Home Exercise Plan Pt will be independent with Home Exercise Program (HEP) to facilitate carryover of treatment strategies and techniques in home/community environment.   Consulted and Agree with Plan of Care Patient      Patient will benefit from skilled therapeutic intervention in order to improve the following deficits and impairments:   Cognitive  communication deficit - Plan: SLP plan of care cert/re-cert    Problem List Patient Active Problem List   Diagnosis Date Noted  . Brain mass 04/30/2016  . Generalized weakness 04/30/2016  . Nonintractable headache    Thank you,  Genene Churn, [place]  [facility] 05/26/2016, 4:49 PM  [facility] [address] [place], [place], [address] Phone: [phone]   Fax:  [phone]  Name: Donald Mayer MRN: [id number] Date of Birth: 02/28/1986

## 2016-05-26 NOTE — Therapy (Signed)
[facility] [address] [place], [place], [address] Phone: [phone]   Fax:  [phone]  Physical Therapy Treatment  Patient Details  Name: Donald Mayer MRN: [id number] Date of Birth: Feb 21, 1986 Referring Provider: Kary Kos   Encounter Date: 05/26/2016      PT End of Session - 05/26/16 1040    Visit Number 3   Number of Visits 8   Date for PT Re-Evaluation 06/11/16   Authorization Type Aetna Managed    Authorization Time Period 05/14/16 to 06/13/16   PT Start Time 1035   PT Stop Time 1114   PT Time Calculation (min) 39 min      Past Medical History:  Diagnosis Date  . Back injuries     Past Surgical History:  Procedure Laterality Date  . APPLICATION OF CRANIAL NAVIGATION N/A 05/04/2016   Procedure: APPLICATION OF CRANIAL NAVIGATION;  Surgeon: Consuella Lose, MD;  Location: [facility];  Service: Neurosurgery;  Laterality: N/A;  . ORIF WRIST FRACTURE Left 12/15/2012   Procedure: OPEN REDUCTION INTERNAL FIXATION (ORIF) WRIST FRACTURE; Left.  Reduction, repair, and reconnstruction of perilunate fracture dislocation;  Surgeon: Roseanne Kaufman, MD;  Location: [facility];  Service: Orthopedics;  Laterality: Left;  . STERIOTACTIC STIMULATOR INSERTION Right 05/04/2016   Procedure: STERIOTACTIC RIGHT FRONTAL CRANIOTOMY with BrainLab;  Surgeon: Consuella Lose, MD;  Location: [facility];  Service: Neurosurgery;  Laterality: Right;  right    There were no vitals filed for this visit.      Subjective Assessment - 05/26/16 1035    Subjective Pt stated he went to surgeon following last session, reports of relief.  Reports compliance with HEP daily and has began walking around neighborhood for 5 min daily.     Patient Stated Goals get stronger, get energy back, get back to PLOF    Currently in Pain? No/denies                         [facility] Adult PT Treatment/Exercise - 05/26/16 0001      Knee/Hip Exercises: Standing   Heel Raises 15 reps   Heel Raises Limitations toeraises 15 reps no HHA   Forward Lunges Both;15 reps   Forward Lunges Limitations onto 4" box   Side Lunges Both;15 reps   Side Lunges Limitations onto 4" box   Lateral Step Up Both;15 reps;Hand Hold: 0;Step Height: 6"   Forward Step Up Both;15 reps;Hand Hold: 0;Step Height: 6"   Functional Squat 10 reps   SLS BLE 60" each   SLS with Vectors 2x 10" on foam                  PT Short Term Goals - 05/14/16 1833      PT SHORT TERM GOAL #1   Title Patient to be able to perform 5 minutes of continuous activity with no fatigue or light-headedness to show improving functional activity tolerance and general function    Time 2   Period Weeks   Status New     PT SHORT TERM GOAL #2   Title Patient to be able to verbally state the effect of regular, graded exercise on physical recovery in order to faciltiate good participation in PT and independent exercise program    Time 2   Period Weeks   Status New     PT SHORT TERM GOAL #3   Title Patient to be independent in correctly and consistently performing appropriate HEP, to be  updated PRN    Time 1   Period Weeks   Status New           PT Long Term Goals - 05/14/16 1834      PT LONG TERM GOAL #1   Title Patient to demonstrate functional strength 5/5 in order to improve mobility, functional activity tolerance, and balance    Time 4   Period Weeks   Status New     PT LONG TERM GOAL #2   Title Patient to be able to tolerate at least 20 minutes of continuous physical activity in order to show improvement in functional activity tolerance and enhance community access    Time 4   Period Weeks   Status New     PT LONG TERM GOAL #3   Title Patient to score full points (24/24) on DGI in order to show improved balance and dynamic mobility    Time 4   Period Weeks   Status New     PT LONG TERM GOAL #4   Title Patient to be independent in regular aerobic exercise program, at  least 20 minutes in duration and at least 3 days per week, in order to maintain functional gains and improve general health status    Time 4   Period Weeks   Status New               Plan - 05/26/16 1355    Clinical Impression Statement Continued session focus with improving functional strengthening.  Pt able to tolerate well towards all exercises with no reports of pain.  Vitals were assess through session with HR range from 115 thru 136 bmp.  2 rest breaks given through session per fatigue levels.  Added balance activities for stabilty with ability to SLS 60" BLE and complete vector stance on foam.  No reports of pain through session.     Rehab Potential Good   Clinical Impairments Affecting Rehab Potential (+) age, in generally good health besides recent surgery/tumor; (-) possible fluctuating levels of motivation to particiapte with PT, obesity, sedentary lifestyle    PT Frequency 2x / week   PT Duration 4 weeks   PT Treatment/Interventions ADLs/Self Care Home Management;Biofeedback;Gait training;Stair training;Functional mobility training;Therapeutic activities;Therapeutic exercise;Balance training;Neuromuscular re-education;Patient/family education;Manual techniques;Energy conservation;Taping   PT Next Visit Plan continue focus on functional strength, endurance, functional activity tolerance. monitor vitals.       Patient will benefit from skilled therapeutic intervention in order to improve the following deficits and impairments:  Decreased coordination, Decreased mobility, Postural dysfunction, Decreased activity tolerance, Decreased strength, Impaired perceived functional ability, Decreased balance  Visit Diagnosis: Muscle weakness (generalized)  Unsteadiness on feet  Difficulty in walking, not elsewhere classified  Other symptoms and signs involving the musculoskeletal system     Problem List Patient Active Problem List   Diagnosis Date Noted  . Brain mass  04/30/2016  . Generalized weakness 04/30/2016  . Nonintractable headache    Ihor Austin, LPTA; [place]  Aldona Lento 05/26/2016, 2:02 PM  [facility] [address] [place], [place], [address] Phone: [phone]   Fax:  [phone]  Name: Donald Mayer MRN: [id number] Date of Birth: Nov 27, 1985

## 2016-05-27 NOTE — Telephone Encounter (Signed)
Unable to leave a message for Renee at this time.

## 2016-05-28 ENCOUNTER — Encounter (HOSPITAL_COMMUNITY): Payer: Self-pay | Admitting: Speech Pathology

## 2016-05-28 ENCOUNTER — Ambulatory Visit (HOSPITAL_COMMUNITY): Payer: Managed Care, Other (non HMO) | Admitting: Physical Therapy

## 2016-05-28 ENCOUNTER — Ambulatory Visit (HOSPITAL_COMMUNITY): Payer: Managed Care, Other (non HMO) | Admitting: Speech Pathology

## 2016-05-28 DIAGNOSIS — R29898 Other symptoms and signs involving the musculoskeletal system: Secondary | ICD-10-CM

## 2016-05-28 DIAGNOSIS — M6281 Muscle weakness (generalized): Secondary | ICD-10-CM | POA: Diagnosis not present

## 2016-05-28 DIAGNOSIS — R2681 Unsteadiness on feet: Secondary | ICD-10-CM

## 2016-05-28 DIAGNOSIS — R41841 Cognitive communication deficit: Secondary | ICD-10-CM

## 2016-05-28 DIAGNOSIS — R262 Difficulty in walking, not elsewhere classified: Secondary | ICD-10-CM

## 2016-05-28 NOTE — Therapy (Signed)
[facility] [place], [place], [address] Phone: [phone]   Fax:  [phone]  Physical Therapy Treatment (Discharge)  Patient Details  Name: Donald Mayer MRN: [id number] Date of Birth: 03-26-1986 Referring Provider: Kary Kos   Encounter Date: 05/28/2016      PT End of Session - 05/28/16 1733    Visit Number 4   Number of Visits 4   Date for PT Re-Evaluation 05/28/16   Authorization Type Aetna Managed    Authorization Time Period 05/14/16 to 06/13/16   PT Start Time 0920  arrived late   PT Stop Time 0940  DC today, no further skilled services required    PT Time Calculation (min) 20 min   Activity Tolerance Patient tolerated treatment well   Behavior During Therapy [facility] for tasks assessed/performed      Past Medical History:  Diagnosis Date  . Back injuries     Past Surgical History:  Procedure Laterality Date  . APPLICATION OF CRANIAL NAVIGATION N/A 05/04/2016   Procedure: APPLICATION OF CRANIAL NAVIGATION;  Surgeon: Consuella Lose, MD;  Location: [facility];  Service: Neurosurgery;  Laterality: N/A;  . ORIF WRIST FRACTURE Left 12/15/2012   Procedure: OPEN REDUCTION INTERNAL FIXATION (ORIF) WRIST FRACTURE; Left.  Reduction, repair, and reconnstruction of perilunate fracture dislocation;  Surgeon: Roseanne Kaufman, MD;  Location: [facility];  Service: Orthopedics;  Laterality: Left;  . STERIOTACTIC STIMULATOR INSERTION Right 05/04/2016   Procedure: STERIOTACTIC RIGHT FRONTAL CRANIOTOMY with BrainLab;  Surgeon: Consuella Lose, MD;  Location: [facility];  Service: Neurosurgery;  Laterality: Right;  right    There were no vitals filed for this visit.      Subjective Assessment - 05/28/16 0922    Subjective Patient arrives late today. He states that he is really feeling quite good and unlimited, no major functional deficits that he can think of. He rates himself as being 85-90% on a subjective scale.    Pertinent  History brain mass, history of back injuries, steriotactic stimulator insertion R frontal lobe    How long can you sit comfortably? 12/14- unlimited    How long can you stand comfortably? 12/14- not sure    How long can you walk comfortably? 12/14- walks around neighborhood    Patient Stated Goals get stronger, get energy back, get back to PLOF    Currently in Pain? No/denies            [facility] PT Assessment - 05/28/16 0925      Strength   Right Hip Flexion 5/5   Right Hip Extension 4+/5   Right Hip ABduction 4+/5   Left Hip Flexion 5/5   Left Hip Extension 4+/5   Left Hip ABduction 4+/5   Right Knee Flexion 4+/5   Right Knee Extension 5/5   Left Knee Flexion 4+/5   Left Knee Extension 5/5   Right Ankle Dorsiflexion 5/5   Left Ankle Dorsiflexion 5/5     6 minute walk test results    Aerobic Endurance Distance Walked 830   Endurance additional comments 3MWT      Dynamic Gait Index   Level Surface Normal   Change in Gait Speed Normal   Gait with Horizontal Head Turns Normal   Gait with Vertical Head Turns Normal   Gait and Pivot Turn Normal   Step Over Obstacle Normal   Step Around Obstacles Normal   Steps Normal   Total Score 24  PT Education - 05/28/16 1732    Education provided Yes   Education Details DC today, continue with regualar aerobic activity, speak to MD about precautions for full return to gym workouts    Person(s) Educated Patient   Methods Explanation   Comprehension Verbalized understanding          PT Short Term Goals - 05/28/16 0935      PT SHORT TERM GOAL #1   Title Patient to be able to perform 5 minutes of continuous activity with no fatigue or light-headedness to show improving functional activity tolerance and general function    Time 2   Period Weeks   Status Achieved     PT SHORT TERM GOAL #2   Title Patient to be able to verbally state the effect of regular, graded exercise on  physical recovery in order to faciltiate good participation in PT and independent exercise program    Time 2   Period Weeks   Status Achieved     PT SHORT TERM GOAL #3   Title Patient to be independent in correctly and consistently performing appropriate HEP, to be updated PRN    Time 1   Period Weeks   Status Partially Met           PT Long Term Goals - 05/28/16 0936      PT LONG TERM GOAL #1   Title Patient to demonstrate functional strength 5/5 in order to improve mobility, functional activity tolerance, and balance    Time 4   Period Weeks   Status Achieved     PT LONG TERM GOAL #2   Title Patient to be able to tolerate at least 20 minutes of continuous physical activity in order to show improvement in functional activity tolerance and enhance community access    Time 4   Period Weeks   Status Achieved     PT LONG TERM GOAL #3   Title Patient to score full points (24/24) on DGI in order to show improved balance and dynamic mobility    Time 4   Period Weeks   Status Achieved     PT LONG TERM GOAL #4   Title Patient to be independent in regular aerobic exercise program, at least 20 minutes in duration and at least 3 days per week, in order to maintain functional gains and improve general health status    Time 4   Period Weeks   Status Partially Met               Plan - 05/28/16 1733    Clinical Impression Statement Re-assessment performed today due to patient reports of high level of function. Patient has made excellent progress with skilled PT services and shows fantastic scores on all functional testing performed today, including strength, gait, and dynamic balance. Patient reports he feels unlimited in all physical tasks and has continued to increase his activity regularly per DPT advice. Encouraged patient to speak directly with MD regarding any advice or precautions that MD wants him to take with return to the gym/high intensity physical activity before  starting this. DC today due to high level of function, no further skilled services required at this time.    Rehab Potential Good   Clinical Impairments Affecting Rehab Potential (+) age, in generally good health besides recent surgery/tumor; (-) possible fluctuating levels of motivation to particiapte with PT, obesity, sedentary lifestyle    PT Next Visit Plan DC today    PT Home Exercise Plan  Eval: regular walking program, LAQS with red TB, no UE STS  12/5:  standing heelraises, functional squats, hip abduction, hip extension   Consulted and Agree with Plan of Care Patient      Patient will benefit from skilled therapeutic intervention in order to improve the following deficits and impairments:  Decreased coordination, Decreased mobility, Postural dysfunction, Decreased activity tolerance, Decreased strength, Impaired perceived functional ability, Decreased balance  Visit Diagnosis: Muscle weakness (generalized)  Unsteadiness on feet  Difficulty in walking, not elsewhere classified  Other symptoms and signs involving the musculoskeletal system     Problem List Patient Active Problem List   Diagnosis Date Noted  . Brain mass 04/30/2016  . Generalized weakness 04/30/2016  . Nonintractable headache     Deniece Ree PT, DPT [facility] [address] [place], [place], [address] Phone: [phone]   Fax:  [phone]  Name: Donald Mayer MRN: [id number] Date of Birth: Aug 12, 1985

## 2016-05-28 NOTE — Therapy (Signed)
[place], [place], [address] Phone: [phone]   Fax:  [phone]  Speech Language Pathology Treatment  Patient Details  Name: Donald Mayer MRN: [id number] Date of Birth: 1985/07/02 Referring Provider: Kary Kos  Encounter Date: 05/28/2016      End of Session - 05/28/16 1110    Visit Number 2   Number of Visits 8   Date for SLP Re-Evaluation 06/27/15   Authorization Type [employer]   SLP Start Time 0945   SLP Stop Time  1034   SLP Time Calculation (min) 49 min   Activity Tolerance Patient tolerated treatment well      Past Medical History:  Diagnosis Date  . Back injuries     Past Surgical History:  Procedure Laterality Date  . APPLICATION OF CRANIAL NAVIGATION N/A 05/04/2016   Procedure: APPLICATION OF CRANIAL NAVIGATION;  Surgeon: Consuella Lose, MD;  Location: [facility];  Service: Neurosurgery;  Laterality: N/A;  . ORIF WRIST FRACTURE Left 12/15/2012   Procedure: OPEN REDUCTION INTERNAL FIXATION (ORIF) WRIST FRACTURE; Left.  Reduction, repair, and reconnstruction of perilunate fracture dislocation;  Surgeon: Roseanne Kaufman, MD;  Location: [facility];  Service: Orthopedics;  Laterality: Left;  . STERIOTACTIC STIMULATOR INSERTION Right 05/04/2016   Procedure: STERIOTACTIC RIGHT FRONTAL CRANIOTOMY with BrainLab;  Surgeon: Consuella Lose, MD;  Location: [facility];  Service: Neurosurgery;  Laterality: Right;  right    There were no vitals filed for this visit.      Subjective Assessment - 05/28/16 1106    Subjective "I graduated from the other therapy."   Currently in Pain? No/denies               ADULT SLP TREATMENT - 05/28/16 1106      General Information   Behavior/Cognition Alert;Cooperative;Pleasant mood   Patient Positioning Upright in chair   Oral care provided N/A   HPI Donald Mayer is a 30 yo male who was referred by Dr. 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     Treatment Provided   Treatment provided Cognitive-Linquistic     Pain Assessment   Pain Assessment No/denies pain     Cognitive-Linquistic Treatment   Treatment focused on Cognition;Patient/family/caregiver education   Skilled Treatment mental calculations, complex attention tasks, moderate level planning and thought organization tasks, self evaluation     Assessment / Recommendations / Plan   Plan Continue with current plan of care     Progression Toward Goals   Progression toward goals Progressing toward goals          SLP Education - 05/28/16 1109    Education provided Yes   Education Details Reviewed evaluation and goals; HEP   Person(s) Educated Patient   Methods Explanation;Verbal cues;Handout   Comprehension Verbalized understanding          SLP Short Term Goals - 05/28/16 1120      SLP SHORT TERM GOAL #1   Title Pt will complete functional memory tasks with 90% acc with min assist and use of strategies as needed.   Baseline 60%   Time 4   Period Weeks   Status On-going     SLP SHORT TERM GOAL #2   Title Pt will complete moderately complex thought organization and planning tasks with 100% acc with min assist and use of strategies as needed.   Baseline moderate assist needed to complete   Time 4   Period Weeks  Status On-going     SLP SHORT TERM GOAL #3   Title Pt will complete alternating and divided attention tasks in dynamic environment with 90% acc with use of strategies as needed.   Baseline 75% acc   Time 4   Period Weeks   Status On-going     SLP SHORT TERM GOAL #4   Title Pt will answer moderately complex "Wh" questions with 95% acc for content and accuracy with min assist and use of strategies.   Baseline Pt does not always fully answer questions/complete his thoughts   Time 4   Period Weeks   Status On-going          SLP Long Term Goals - 05/28/16 1120      SLP LONG TERM GOAL #1   Title Pt  will be able to complete high level attention, planning, and thought organization tasks to [facility] with use of strategies.   Baseline Min impairment   Time 4   Period Weeks   Status On-going          Plan - 05/28/16 1110    Clinical Impression Statement Pt discharged from PT today and reports feeling optimistic about that. Previous session's goals and evaluation reviewed and Pt reports that things are "going well" at home. His mother is currently handling his bills/finances at the moment and he was asked to start resuming previous responsibilities in order to assess his competence in those domains. In session, he completed a moderate level planning and thought organization task with initial cues to specify times in his response. He completed with 100% acc and even check his work independently (and found an error). He was able to thoughtfully explain his reasoning for selected times. He was presented with a complex attention task (involving alphabetizing 3-word sentences orally). He completed 12/15 sequences with no errors in a distracting environment (door open, his phone buzzing...). Mental calculation (addition, subtraction, multiplication, and division) task presented and Pt completed with 90% acc. Pt seems to have difficulty with subtraction only, likely due to rote memorization of multiplication and division facts. He was given homework targeting written subtraction problems involving re-grouping in addition to another thought organization and planning task. Pt is making good progress toward goals. Continue plan of care.    Speech Therapy Frequency 2x / week   Duration 4 weeks   Treatment/Interventions Cognitive reorganization;SLP instruction and feedback;Compensatory strategies;Patient/family education;Cueing hierarchy;Functional tasks;Internal/external aids;Environmental controls   Potential to Achieve Goals Good   Potential Considerations Ability to learn/carryover information   SLP Home  Exercise Plan Pt will be independent with Home Exercise Program (HEP) to facilitate carryover of treatment strategies and techniques in home/community environment.   Consulted and Agree with Plan of Care Patient      Patient will benefit from skilled therapeutic intervention in order to improve the following deficits and impairments:   Cognitive communication deficit    Problem List Patient Active Problem List   Diagnosis Date Noted  . Brain mass 04/30/2016  . Generalized weakness 04/30/2016  . Nonintractable headache    Thank you,  Genene Churn, [place]  [facility] 05/28/2016, 11:24 AM  [facility] [address] [place], [place], [address] Phone: [phone]   Fax:  [phone]   Name: WINFIELD SVETLIK MRN: [id number] Date of Birth: [birth year]

## 2016-06-01 ENCOUNTER — Ambulatory Visit (HOSPITAL_COMMUNITY): Payer: Managed Care, Other (non HMO) | Admitting: Speech Pathology

## 2016-06-02 ENCOUNTER — Ambulatory Visit (HOSPITAL_COMMUNITY): Payer: Managed Care, Other (non HMO)

## 2016-06-04 ENCOUNTER — Ambulatory Visit (HOSPITAL_COMMUNITY): Payer: Managed Care, Other (non HMO)

## 2016-06-04 ENCOUNTER — Ambulatory Visit (HOSPITAL_COMMUNITY): Payer: Managed Care, Other (non HMO) | Admitting: Speech Pathology

## 2016-06-04 ENCOUNTER — Encounter (HOSPITAL_COMMUNITY): Payer: Self-pay | Admitting: Speech Pathology

## 2016-06-04 DIAGNOSIS — M6281 Muscle weakness (generalized): Secondary | ICD-10-CM | POA: Diagnosis not present

## 2016-06-04 DIAGNOSIS — R41841 Cognitive communication deficit: Secondary | ICD-10-CM

## 2016-06-04 NOTE — Therapy (Signed)
[place], [place], [address] Phone: [phone]   Fax:  [phone]  Speech Language Pathology Treatment  Patient Details  Name: Donald Mayer MRN: [id number] Date of Birth: 1985/08/26 Referring Provider: Kary Kos  Encounter Date: 06/04/2016      End of Session - 06/04/16 1612    Visit Number 3   Number of Visits 8   Date for SLP Re-Evaluation 06/27/15   Authorization Type [employer]   SLP Start Time [id number]   SLP Stop Time  1615   SLP Time Calculation (min) 45 min   Activity Tolerance Patient tolerated treatment well      Past Medical History:  Diagnosis Date  . Back injuries     Past Surgical History:  Procedure Laterality Date  . APPLICATION OF CRANIAL NAVIGATION N/A 05/04/2016   Procedure: APPLICATION OF CRANIAL NAVIGATION;  Surgeon: Consuella Lose, MD;  Location: [facility];  Service: Neurosurgery;  Laterality: N/A;  . ORIF WRIST FRACTURE Left 12/15/2012   Procedure: OPEN REDUCTION INTERNAL FIXATION (ORIF) WRIST FRACTURE; Left.  Reduction, repair, and reconnstruction of perilunate fracture dislocation;  Surgeon: Roseanne Kaufman, MD;  Location: [facility];  Service: Orthopedics;  Laterality: Left;  . STERIOTACTIC STIMULATOR INSERTION Right 05/04/2016   Procedure: STERIOTACTIC RIGHT FRONTAL CRANIOTOMY with BrainLab;  Surgeon: Consuella Lose, MD;  Location: [facility];  Service: Neurosurgery;  Laterality: Right;  right    There were no vitals filed for this visit.      Subjective Assessment - 06/04/16 1601    Subjective "I moved back to my house."   Currently in Pain? No/denies           ADULT SLP TREATMENT - 06/04/16 1601      General Information   Behavior/Cognition Alert;Cooperative;Pleasant mood   Patient Positioning Upright in chair   Oral care provided N/A   HPI Donald Mayer is a 30 yo male who was referred by Dr. Kary Kos for SLP evaluation due to cognitive linguistic deficits s/p brain  tumor excision.     Treatment Provided   Treatment provided Cognitive-Linquistic     Pain Assessment   Pain Assessment No/denies pain     Cognitive-Linquistic Treatment   Treatment focused on Cognition;Patient/family/caregiver education   Skilled Treatment mental calculations, complex attention tasks, moderate level planning and thought organization tasks, self evaluation     Assessment / Recommendations / Plan   Plan Continue with current plan of care     Progression Toward Goals   Progression toward goals Progressing toward goals            SLP Short Term Goals - 06/04/16 1709      SLP SHORT TERM GOAL #1   Title Pt will complete functional memory tasks with 90% acc with min assist and use of strategies as needed.   Baseline 60%   Time 4   Period Weeks   Status On-going     SLP SHORT TERM GOAL #2   Title Pt will complete moderately complex thought organization and planning tasks with 100% acc with min assist and use of strategies as needed.   Baseline moderate assist needed to complete   Time 4   Period Weeks   Status On-going     SLP SHORT TERM GOAL #3   Title Pt will complete alternating and divided attention tasks in dynamic environment with 90% acc with use of strategies as needed.   Baseline 75% acc   Time 4  Period Weeks   Status On-going     SLP SHORT TERM GOAL #4   Title Pt will answer moderately complex "Wh" questions with 95% acc for content and accuracy with min assist and use of strategies.   Baseline Pt does not always fully answer questions/complete his thoughts   Time 4   Period Weeks   Status On-going          SLP Long Term Goals - 06/04/16 1709      SLP LONG TERM GOAL #1   Title Pt will be able to complete high level attention, planning, and thought organization tasks to [facility] with use of strategies.   Baseline Min impairment   Time 4   Period Weeks   Status On-going          Plan - 06/04/16 1708    Clinical Impression Statement  Pt did not show for his SLP appointment on Monday. He states that he was ill on Sunday and did not have a ride here. He moved from his Donald Mayer's in [place] to his apartment in [place]. He feels like he is doing well in terms of his thinking and memory. He wonders if he will be bored if he doesn't return to work until March. Pt made excellent progress toward all goals this session. He completed subtraction of two 4-digit numbers with two or more regrouping with 100% acc in typical time frame. He completed moderate level sequencing and thought organization task with 100% independently and checked his work without cues. SLP then provided auditory directions (moderately complex) for paper/pencil task and he completed with 100% acc with indirect cues x 1. Finally, he was challenged with a complex attention task which required him to read a 4-paragraph article for comprehension while searching for specific words in the text to underline/cross out all with the door open for distraction. After the first reading, he had identified 39/43 targets and after the second reading, he identified 42/43 targets. His reading comprehension lacked details, but he was able to provide a summary. Pt had little difficulty with any of the tasks presented today. Would like to see Pt for one more visit to ensure continued improvement and progress toward goals. Pt in agreement with plan of care. Will likely discharge Pt from SLP services next week.   Speech Therapy Frequency 2x / week   Duration 4 weeks   Treatment/Interventions Cognitive reorganization;SLP instruction and feedback;Compensatory strategies;Patient/family education;Cueing hierarchy;Functional tasks;Internal/external aids;Environmental controls   Potential to Achieve Goals Good   Potential Considerations Ability to learn/carryover information   SLP Home Exercise Plan Pt will be independent with Home Exercise Program (HEP) to facilitate carryover of treatment strategies and  techniques in home/community environment.   Consulted and Agree with Plan of Care Patient      Patient will benefit from skilled therapeutic intervention in order to improve the following deficits and impairments:   Cognitive communication deficit    Problem List Patient Active Problem List   Diagnosis Date Noted  . Brain mass 04/30/2016  . Generalized weakness 04/30/2016  . Nonintractable headache    Thank you,  Genene Churn, [place]  [facility] 06/04/2016, 5:11 PM  [place], [place], [address] Phone: [phone]   Fax:  [phone]   Name: HODGES MANSPEAKER MRN: [id number] Date of Birth: [birth year]

## 2016-06-09 ENCOUNTER — Encounter (HOSPITAL_COMMUNITY): Payer: Self-pay | Admitting: Speech Pathology

## 2016-06-09 ENCOUNTER — Ambulatory Visit (HOSPITAL_COMMUNITY): Payer: Managed Care, Other (non HMO) | Admitting: Speech Pathology

## 2016-06-09 ENCOUNTER — Ambulatory Visit (HOSPITAL_COMMUNITY): Payer: Managed Care, Other (non HMO) | Admitting: Physical Therapy

## 2016-06-09 DIAGNOSIS — R41841 Cognitive communication deficit: Secondary | ICD-10-CM

## 2016-06-09 DIAGNOSIS — M6281 Muscle weakness (generalized): Secondary | ICD-10-CM | POA: Diagnosis not present

## 2016-06-09 NOTE — Therapy (Signed)
[place], [place], [address] Phone: [phone]   Fax:  [phone]  Speech Language Pathology Treatment  Patient Details  Name: Donald Mayer MRN: [id number] Date of Birth: [birth year] Referring Provider: Kary Kos  Encounter Date: 06/09/2016      End of Session - 06/09/16 1437    Visit Number 4   Number of Visits 8   Date for SLP Re-Evaluation 06/27/15   Authorization Type Aetna   SLP Start Time 2919   SLP Stop Time  1432   SLP Time Calculation (min) 47 min   Activity Tolerance Patient tolerated treatment well      Past Medical History:  Diagnosis Date  . Back injuries     Past Surgical History:  Procedure Laterality Date  . APPLICATION OF CRANIAL NAVIGATION N/A 05/04/2016   Procedure: APPLICATION OF CRANIAL NAVIGATION;  Surgeon: Consuella Lose, MD;  Location: [facility];  Service: Neurosurgery;  Laterality: N/A;  . ORIF WRIST FRACTURE Left 12/15/2012   Procedure: OPEN REDUCTION INTERNAL FIXATION (ORIF) WRIST FRACTURE; Left.  Reduction, repair, and reconnstruction of perilunate fracture dislocation;  Surgeon: Roseanne Kaufman, MD;  Location: [facility];  Service: Orthopedics;  Laterality: Left;  . STERIOTACTIC STIMULATOR INSERTION Right 05/04/2016   Procedure: STERIOTACTIC RIGHT FRONTAL CRANIOTOMY with BrainLab;  Surgeon: Consuella Lose, MD;  Location: [facility];  Service: Neurosurgery;  Laterality: Right;  right    There were no vitals filed for this visit.      Subjective Assessment - 06/09/16 1435    Subjective "I am doing ok."   Currently in Pain? No/denies               ADULT SLP TREATMENT - 06/09/16 1436      General Information   Behavior/Cognition Alert;Cooperative;Pleasant mood   Patient Positioning Upright in chair   Oral care provided N/A   HPI Donald Mayer is a 30 yo male who was referred by Dr. Kary Kos for SLP evaluation due to cognitive linguistic deficits s/p brain  tumor excision.     Treatment Provided   Treatment provided Cognitive-Linquistic     Pain Assessment   Pain Assessment No/denies pain     Cognitive-Linquistic Treatment   Treatment focused on Cognition;Patient/family/caregiver education   Skilled Treatment mental calculations, complex attention tasks, moderate level planning and thought organization tasks, self evaluation     Assessment / Recommendations / Plan   Plan All goals met;Discharge SLP treatment due to (comment)     Progression Toward Goals   Progression toward goals Goals met, education completed, patient discharged from [facility] - 06/09/16 1437      SLP [place] #1   Title Pt will complete functional memory tasks with 90% acc with min assist and use of strategies as needed.   Baseline 60%   Time 4   Period Weeks   Status Achieved     SLP SHORT TERM GOAL #2   Title Pt will complete moderately complex thought organization and planning tasks with 100% acc with min assist and use of strategies as needed.   Baseline moderate assist needed to complete   Time 4   Period Weeks   Status Achieved     SLP SHORT TERM GOAL #3   Title Pt will complete alternating and divided attention tasks in dynamic environment with 90% acc with use of strategies as needed.  Baseline 75% acc   Time 4   Period Weeks   Status Achieved     SLP SHORT TERM GOAL #4   Title Pt will answer moderately complex "Wh" questions with 95% acc for content and accuracy with min assist and use of strategies.   Baseline Pt does not always fully answer questions/complete his thoughts   Time 4   Period Weeks   Status Achieved          SLP Long Term Goals - 06/09/16 1438      SLP LONG TERM GOAL #1   Title Pt will be able to complete high level attention, planning, and thought organization tasks to [facility] with use of strategies.   Baseline Min impairment   Time 4   Period Weeks   Status Achieved           Plan - 06/09/16 1433    Clinical Impression Statement Pt seen for ongoing cognitive linguistic therapy. He did not complete all of his homework, because he said he grew frustrated with it. He stated that he most likely would have done the same thing before his brain tumor because he does not like reading. He was able to complete with 100% acc when SLP provided mi/mod cues. 10 item memory recall task completed with 5/10 on the first trial and 10/10 on the second trial. Pt recognized that he was not attending to task on first trial. He was then able to name 10/10 words without association cues. Memory strategies were reviewed and provided in written form. Pt feels that he is back to his baseline in terms of cognitive linguistic functioning. He was encouraged to move his appointment closer if he is considering trying to return to work before March. He notes that he is most frustrated about not being able to drive until spring/summer because of his reported seizure. Will discharge from SLP services at this time. Reconsult as needed.      Patient will benefit from skilled therapeutic intervention in order to improve the following deficits and impairments:   Cognitive communication deficit    Problem List Patient Active Problem List   Diagnosis Date Noted  . Brain mass 04/30/2016  . Generalized weakness 04/30/2016  . Nonintractable headache    SPEECH THERAPY DISCHARGE SUMMARY  Visits from Start of Care: 4  Current functional level related to goals / functional outcomes: All goals met   Remaining deficits: None   Education / Equipment: completed  Plan: Patient agrees to discharge.  Patient goals were met. Patient is being discharged due to meeting the stated rehab goals.  ?????        Thank you,  Genene Churn, [place]  [facility] 06/09/2016, 2:44 PM  [place] [address] [place], [place], [address] Phone:  [phone]   Fax:  [phone]   Name: Donald Mayer MRN: [id number] Date of Birth: 13-Jan-1986

## 2016-06-11 ENCOUNTER — Ambulatory Visit (HOSPITAL_COMMUNITY): Payer: Managed Care, Other (non HMO) | Admitting: Speech Pathology

## 2016-06-11 ENCOUNTER — Ambulatory Visit (HOSPITAL_COMMUNITY): Payer: Managed Care, Other (non HMO) | Admitting: Physical Therapy

## 2016-07-14 ENCOUNTER — Encounter (HOSPITAL_COMMUNITY): Payer: Self-pay

## 2016-07-14 ENCOUNTER — Emergency Department (HOSPITAL_COMMUNITY): Payer: Managed Care, Other (non HMO)

## 2016-07-14 ENCOUNTER — Emergency Department (HOSPITAL_COMMUNITY)
Admission: EM | Admit: 2016-07-14 | Discharge: 2016-07-14 | Disposition: A | Discharge status: Home / Self Care | Payer: Managed Care, Other (non HMO) | Attending: Emergency Medicine | Admitting: Emergency Medicine

## 2016-07-14 DIAGNOSIS — Y9301 Activity, walking, marching and hiking: Secondary | ICD-10-CM | POA: Diagnosis not present

## 2016-07-14 DIAGNOSIS — S0990XA Unspecified injury of head, initial encounter: Secondary | ICD-10-CM | POA: Diagnosis not present

## 2016-07-14 DIAGNOSIS — Y929 Unspecified place or not applicable: Secondary | ICD-10-CM | POA: Insufficient documentation

## 2016-07-14 DIAGNOSIS — Y999 Unspecified external cause status: Secondary | ICD-10-CM | POA: Insufficient documentation

## 2016-07-14 DIAGNOSIS — W2201XA Walked into wall, initial encounter: Secondary | ICD-10-CM | POA: Diagnosis not present

## 2016-07-14 MED ORDER — IBUPROFEN 400 MG PO TABS
ORAL_TABLET | ORAL | Status: AC
  Filled 2016-07-14: qty 1

## 2016-07-14 MED ORDER — IBUPROFEN 400 MG PO TABS
400.0000 mg | ORAL_TABLET | Freq: Once | ORAL | Status: AC | PRN
  Administered 2016-07-14: 400 mg via ORAL

## 2016-07-14 NOTE — Discharge Instructions (Signed)
Follow-up with your primary doctor regarding year knee concerns oryou can consider seeing a sports medicine or orthopedic doctor.

## 2016-07-14 NOTE — ED Notes (Signed)
Pt is in stable condition upon d/c and ambulates from ED. 

## 2016-07-14 NOTE — ED Provider Notes (Signed)
[facility] DEPT Provider Note   CSN: [id number] Arrival date & time: 07/14/16  0259     History   Chief Complaint Chief Complaint  Patient presents with  . Head Injury    HPI Donald Mayer is a 31 y.o. male.  HPI Pt was was walking.  He felt like his right knee gave out.  He has had trouble with it in the past.   He has had xrays and was told that there were no abnormalities.  He feels like the joint is sore a lot and gives out sometimes.  No known prior injuries.  When his knee gave out he hit his head.  He felt dizzy but had no LOC.   Pt has history of brain surgery .  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  Past Medical History:  Diagnosis Date  . Back injuries     Patient Active Problem List   Diagnosis Date Noted  . Brain mass 04/30/2016  . Generalized weakness 04/30/2016  . Nonintractable headache     Past Surgical History:  Procedure Laterality Date  . APPLICATION OF CRANIAL NAVIGATION N/A 05/04/2016   Procedure: APPLICATION OF CRANIAL NAVIGATION;  Surgeon: Consuella Lose, MD;  Location: [facility];  Service: Neurosurgery;  Laterality: N/A;  . ORIF WRIST FRACTURE Left 12/15/2012   Procedure: OPEN REDUCTION INTERNAL FIXATION (ORIF) WRIST FRACTURE; Left.  Reduction, repair, and reconnstruction of perilunate fracture dislocation;  Surgeon: Roseanne Kaufman, MD;  Location: [facility];  Service: Orthopedics;  Laterality: Left;  . STERIOTACTIC STIMULATOR INSERTION Right 05/04/2016   Procedure: STERIOTACTIC RIGHT FRONTAL CRANIOTOMY with BrainLab;  Surgeon: Consuella Lose, MD;  Location: [facility];  Service: Neurosurgery;  Laterality: Right;  right       Home Medications    Prior to Admission medications   Medication Sig Start Date End Date Taking? Authorizing Provider  acetaminophen (TYLENOL) 500 MG tablet Take 1 tablet (500 mg total) by mouth every 6 (six) hours as needed. 05/23/14   Jennifer Piepenbrink, PA-C  dexamethasone (DECADRON) 1 MG tablet Take 1 tablet (1 mg  total) by mouth every 12 (twelve) hours. 05/10/16   Newman Pies, MD  HYDROcodone-acetaminophen (NORCO/VICODIN) 5-325 MG tablet Take 1 tablet by mouth every 4 (four) hours as needed for moderate pain. 05/10/16   Newman Pies, MD  levETIRAcetam (KEPPRA) 500 MG tablet Take 1 tablet (500 mg total) by mouth 2 (two) times daily. 05/10/16   Newman Pies, MD    Family History Family History  Problem Relation Age of Onset  . Breast cancer Maternal Grandmother   . Lung cancer Maternal Grandfather   . Brain cancer Neg Hx     Social History Social History  Substance Use Topics  . Smoking status: Unknown If Ever Smoked  . Smokeless tobacco: Never Used  . Alcohol use Yes     Comment: occasional     Allergies   Patient has no known allergies.   Review of Systems Review of Systems  All other systems reviewed and are negative.    Physical Exam Updated Vital Signs BP 122/85 (BP Location: Right Arm)   Pulse 90   Temp 99.1 F (37.3 C) (Oral)   Resp 18   Ht 6\' 2"  (1.88 m)   Wt (!) 158.8 kg   SpO2 97%   BMI 44.94 kg/m   Physical Exam  Constitutional: He appears well-developed and well-nourished. No distress.  HENT:  Head: Normocephalic and atraumatic.  Right Ear: External ear normal.  Left Ear: External ear normal.  Well-healed right frontal craniotomy scar  Eyes: Conjunctivae are normal. Right eye exhibits no discharge. Left eye exhibits no discharge. No scleral icterus.  Neck: Neck supple. No tracheal deviation present.  Cardiovascular: Normal rate, regular rhythm and intact distal pulses.   Pulmonary/Chest: Effort normal and breath sounds normal. No stridor. No respiratory distress. He has no wheezes. He has no rales.  Abdominal: Soft. Bowel sounds are normal. He exhibits no distension. There is no tenderness. There is no rebound and no guarding.  Musculoskeletal: He exhibits no edema or tenderness.  No edema. Right knee, no laxity  Neurological: He is alert. He has  normal strength. No cranial nerve deficit (no facial droop, extraocular movements intact, no slurred speech) or sensory deficit. He exhibits normal muscle tone. He displays no seizure activity. Coordination normal.  Skin: Skin is warm and dry. No rash noted.  Psychiatric: He has a normal mood and affect.  Nursing note and vitals reviewed.    ED Treatments / Results    Radiology Ct Head Wo Contrast  Result Date: 07/14/2016 CLINICAL DATA:  Right temporal pain after falling and striking head on a wall tonight. EXAM: CT HEAD WITHOUT CONTRAST TECHNIQUE: Contiguous axial images were obtained from the base of the skull through the vertex without intravenous contrast. COMPARISON:  05/05/2016, 04/30/2016. FINDINGS: Brain: No evidence of acute infarction, hemorrhage, hydrocephalus or extra-axial collection. Right frontal resection bed appears unchanged. Vascular: No hyperdense vessel or unexpected calcification. Skull: Right frontal craniotomy. Negative for acute fracture or significant bone lesion. Sinuses/Orbits: No acute finding. Other: None. IMPRESSION: Stable appearances of the right frontal resection bed. No intracranial hemorrhage or other acute abnormality. No mass effect or midline shift. Electronically Signed   By: Andreas Newport M.D.   On: 07/14/2016 03:48    Procedures Procedures (including critical care time)  Medications Ordered in ED Medications  ibuprofen (ADVIL,MOTRIN) 400 MG tablet (not administered)  ibuprofen (ADVIL,MOTRIN) tablet 400 mg (400 mg Oral Given 07/14/16 0610)     Initial Impression / Assessment and Plan / ED Course  I have reviewed the triage vital signs and the nursing notes.  Pertinent labs & imaging results that were available during my care of the patient were reviewed by me and considered in my medical decision making (see chart for details).   patient had a mechanical fall. He denies syncope or seizure. CT scan does not show any concerning injuries  associated with this fall. I also do not note any obvious abnormality to his right knee. I recommend outpatient follow-up consider seeing his primary doctor or an orthopedic doctor  Final Clinical Impressions(s) / ED Diagnoses   Final diagnoses:  Minor head injury, initial encounter      Dorie Rank, MD 07/14/16 [phone]

## 2016-07-14 NOTE — ED Triage Notes (Signed)
Pt states walking, R leg gave out, pt fell and struck head on wall. Pt complaining of R temporal pain. Pt states recent brain surgery to remove cancer. Pt complaining of new onset dizziness since fall, head injury. Pt denies any neck pain/injury. Pt a/o x 4 at triage. NAD.

## 2016-07-17 ENCOUNTER — Other Ambulatory Visit: Payer: Self-pay | Admitting: Sports Medicine

## 2016-07-17 DIAGNOSIS — M25561 Pain in right knee: Secondary | ICD-10-CM

## 2016-07-25 ENCOUNTER — Other Ambulatory Visit: Payer: Managed Care, Other (non HMO)

## 2016-07-25 ENCOUNTER — Inpatient Hospital Stay
Admission: RE | Admit: 2016-07-25 | Discharge: 2016-07-25 | Disposition: A | Discharge status: Home / Self Care | Payer: Managed Care, Other (non HMO) | Source: Ambulatory Visit | Attending: Sports Medicine | Admitting: Sports Medicine

## 2016-08-21 ENCOUNTER — Ambulatory Visit: Payer: Managed Care, Other (non HMO) | Admitting: Medical

## 2016-08-24 ENCOUNTER — Ambulatory Visit: Payer: Managed Care, Other (non HMO) | Admitting: Medical

## 2016-08-26 ENCOUNTER — Encounter: Payer: Self-pay | Admitting: Medical

## 2016-08-26 ENCOUNTER — Ambulatory Visit (INDEPENDENT_AMBULATORY_CARE_PROVIDER_SITE_OTHER): Payer: Managed Care, Other (non HMO) | Admitting: Medical

## 2016-08-26 ENCOUNTER — Ambulatory Visit: Payer: Managed Care, Other (non HMO) | Admitting: Medical

## 2016-08-26 VITALS — BP 128/80 | HR 82 | Temp 98.2°F | Ht 73.0 in | Wt 374.0 lb

## 2016-08-26 DIAGNOSIS — D496 Neoplasm of unspecified behavior of brain: Secondary | ICD-10-CM

## 2016-08-26 NOTE — Progress Notes (Signed)
Pre visit review using our clinic tool,if applicable. No additional management support is needed unless otherwise documented below in the visit note.  

## 2016-08-26 NOTE — Telephone Encounter (Signed)
Pt states his employer has sent over some fmla type forms and some work place [employer] forms. Will you check he fax machines and place it in my folders. Thanks

## 2016-08-26 NOTE — Patient Instructions (Addendum)
I am relieved that you are much better after surgery to remove your brain tumor. On return to work would recommend that you start slow and do 20 hours max a week. If you get severe ha then might need to modify duty or cut hours. Will see how you do. Please get HR to fax [place] forms and let them know plan will be reduced hours.  Continue current treatments advised by neurosurgeon. We may need to get you in with neurologist as well depending on how things proceeds with your work.  Follow up 3 weeks or as needed

## 2016-08-26 NOTE — Progress Notes (Signed)
Subjective:    Patient ID: Donald Mayer, male    DOB: 1986/05/30, 31 y.o.   MRN: [id number]  HPI  Pt in for follow up.  Pt had surgery for brain tumor week of November. Pt saw neurosurgeon last week. Pt states he is doing well. Pt states he gets HA at times looking at screens and watching TV. Pt states for his ha he take excedrin. He takes hydrocodone occasionally. He used it at most one time a day. Pt states he had one seizure in January but none since. Pt states his neurologist is of opinion he can go back to work.  Pt works at [employer]. 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 Pt usually worked 40 hours a week in past. Pt willing to try doing 20 hours a week at beginning. However we don't have his fmla forms in the office.  Pt states saw neurosurgeon last week and he states neurosurgeon states he can go back to work. But also to see pcp as well.   Review of Systems  Constitutional: Negative for chills, fatigue and fever.  Respiratory: Negative for cough, chest tightness, shortness of breath and wheezing.   Cardiovascular: Negative for chest pain and palpitations.  Gastrointestinal: Negative for abdominal pain, constipation and diarrhea.  Musculoskeletal: Negative for back pain.  Skin: Negative for rash.  Neurological: Positive for headaches. Negative for dizziness, seizures and light-headedness.       Occasional ha. History of seizures.  Hematological: Negative for adenopathy. Does not bruise/bleed easily.  Psychiatric/Behavioral: Negative for behavioral problems, confusion, sleep disturbance and suicidal ideas. The patient is not nervous/anxious.      Past Medical History:  Diagnosis Date  . Back injuries      Social History   Social History  . Marital status: Single    Spouse name: N/A  . Number of children: N/A  . Years of education: N/A   Occupational History  . Not on file.   Social History Main Topics  . Smoking  status: Never Smoker  . Smokeless tobacco: Never Used  . Alcohol use Yes     Comment: occasional  . Drug use: No  . Sexual activity: Not on file   Other Topics Concern  . Not on file   Social History Narrative  . No narrative on file    Past Surgical History:  Procedure Laterality Date  . APPLICATION OF CRANIAL NAVIGATION N/A 05/04/2016   Procedure: APPLICATION OF CRANIAL NAVIGATION;  Surgeon: Consuella Lose, MD;  Location: [facility];  Service: Neurosurgery;  Laterality: N/A;  . ORIF WRIST FRACTURE Left 12/15/2012   Procedure: OPEN REDUCTION INTERNAL FIXATION (ORIF) WRIST FRACTURE; Left.  Reduction, repair, and reconnstruction of perilunate fracture dislocation;  Surgeon: Roseanne Kaufman, MD;  Location: [facility];  Service: Orthopedics;  Laterality: Left;  . STERIOTACTIC STIMULATOR INSERTION Right 05/04/2016   Procedure: STERIOTACTIC RIGHT FRONTAL CRANIOTOMY with BrainLab;  Surgeon: Consuella Lose, MD;  Location: [facility];  Service: Neurosurgery;  Laterality: Right;  right    Family History  Problem Relation Age of Onset  . Breast cancer Maternal Grandmother   . Lung cancer Maternal Grandfather   . Brain cancer Neg Hx     No Known Allergies  Current Outpatient Prescriptions on File Prior to Visit  Medication Sig Dispense Refill  . acetaminophen (TYLENOL) 500 MG tablet Take 1 tablet (500 mg total) by mouth every 6 (six) hours as needed. 30 tablet 0  . HYDROcodone-acetaminophen (NORCO/VICODIN)  5-325 MG tablet Take 1 tablet by mouth every 4 (four) hours as needed for moderate pain. 30 tablet 0  . levETIRAcetam (KEPPRA) 500 MG tablet Take 1 tablet (500 mg total) by mouth 2 (two) times daily. 60 tablet 1  . dexamethasone (DECADRON) 1 MG tablet Take 1 tablet (1 mg total) by mouth every 12 (twelve) hours. (Patient not taking: Reported on 08/26/2016) 6 tablet 0   No current facility-administered medications on file prior to visit.     BP (!) 146/80   Pulse 82   Temp 98.2 F (36.8 C)  (Oral)   Ht 6\' 1"  (1.854 m)   Wt (!) 374 lb (169.6 kg)   SpO2 98%   BMI 49.34 kg/m       Objective:   Physical Exam   General Mental Status- Alert. General Appearance- Not in acute distress.   Skin General: Color- Normal Color. Moisture- Normal Moisture.  Neck Carotid Arteries- Normal color. Moisture- Normal Moisture. No carotid bruits. No JVD.  Chest and Lung Exam Auscultation: Breath Sounds:-Normal.  Cardiovascular Auscultation:Rythm- Regular. Murmurs & Other Heart Sounds:Auscultation of the heart reveals- No Murmurs.   Neurologic Cranial Nerve exam:- CN III-XII intact(No nystagmus), symmetric smile. Drift Test:- No drift. Romberg Exam:- Negative.  Finger to Nose:- Normal/Intact Strength:- 5/5 equal and symmetric strength both upper and lower extremities.     Assessment & Plan:  I am relieved that you are much better after surgery to remove your brain tumor. On return to work would recommend that you start slow and do 20 hours max a week. If you get severe ha then might need to modify duty or cut hours. Will see how you do. Please get HR to fax [place] forms and let them know plan will be reduced hours.  Continue current treatments advised by neurosurgeon. We may need to get you in with neurologist as well depending on how things proceeds with your work.  Follow up 3 weeks or as needed  Jawon Dipiero, Percell Miller, [employer]

## 2016-08-27 NOTE — Telephone Encounter (Signed)
Form received.  Filled in as much as possible and forwarded to PCP for review and signature.

## 2016-09-01 NOTE — Telephone Encounter (Signed)
I filled out pt work place [employer]. Will you fax that to [facility].

## 2016-09-02 NOTE — Telephone Encounter (Signed)
Form was faxed by Mackey Birchwood, [place].

## 2016-09-08 ENCOUNTER — Telehealth: Payer: Self-pay | Admitting: Medical

## 2016-09-08 ENCOUNTER — Encounter: Payer: Self-pay | Admitting: Medical

## 2016-09-08 NOTE — Telephone Encounter (Signed)
Pt has history of brain tumor and surgery. Recent severe ha. Will you call and get him scheduled for this Thursday early morning or early afternoon. I would like 30 minutes based on complaint type. Also advise have another form from [place] that would like discuss with him and fill out day he is in office.  Early appointment to avoid wait time.

## 2016-09-09 NOTE — Telephone Encounter (Signed)
Patient scheduled for 09/10/2016 at 1:30pm with Percell Miller

## 2016-09-09 NOTE — Telephone Encounter (Signed)
Forwarded to scheduling

## 2016-09-10 ENCOUNTER — Ambulatory Visit: Payer: Managed Care, Other (non HMO) | Admitting: Medical

## 2016-09-13 NOTE — Telephone Encounter (Signed)
Would you please ask pt to schedule exam for his ha. He needs 30 minute appointment. He missed last appoitment.   Also I need to discuss his modified work note that I gave him. I need to

## 2016-09-14 ENCOUNTER — Telehealth: Payer: Self-pay | Admitting: *Deleted

## 2016-09-14 NOTE — Telephone Encounter (Signed)
Received [employer] Dis[employer] Form requesting copy of 08/26/16 Office Note, attached master Report and faxed to [phone] for Claim Number [id number] 04/02

## 2016-09-15 ENCOUNTER — Encounter: Payer: Self-pay | Admitting: Medical

## 2016-09-16 ENCOUNTER — Ambulatory Visit (INDEPENDENT_AMBULATORY_CARE_PROVIDER_SITE_OTHER): Payer: Managed Care, Other (non HMO) | Admitting: Medical

## 2016-09-16 ENCOUNTER — Ambulatory Visit (HOSPITAL_COMMUNITY): Admission: RE | Admit: 2016-09-16 | Payer: Managed Care, Other (non HMO) | Source: Ambulatory Visit

## 2016-09-16 ENCOUNTER — Encounter: Payer: Self-pay | Admitting: Medical

## 2016-09-16 VITALS — BP 138/80 | HR 100 | Wt 368.8 lb

## 2016-09-16 DIAGNOSIS — R6889 Other general symptoms and signs: Secondary | ICD-10-CM

## 2016-09-16 DIAGNOSIS — R519 Headache, unspecified: Secondary | ICD-10-CM

## 2016-09-16 DIAGNOSIS — D496 Neoplasm of unspecified behavior of brain: Secondary | ICD-10-CM

## 2016-09-16 DIAGNOSIS — L568 Other specified acute skin changes due to ultraviolet radiation: Secondary | ICD-10-CM | POA: Diagnosis not present

## 2016-09-16 DIAGNOSIS — R51 Headache: Secondary | ICD-10-CM | POA: Diagnosis not present

## 2016-09-16 LAB — SEDIMENTATION RATE: Sed Rate: 93 mm/hr — ABNORMAL HIGH (ref 0–15)

## 2016-09-16 MED ORDER — PREDNISONE 20 MG PO TABS: ORAL_TABLET | ORAL | 0 refills | Status: DC

## 2016-09-16 MED ORDER — BUTALBITAL-APAP-CAFF-COD 50-325-40-30 MG PO CAPS: ORAL_CAPSULE | ORAL | 0 refills | Status: DC

## 2016-09-16 MED ORDER — KETOROLAC TROMETHAMINE 60 MG/2ML IM SOLN
60.0000 mg | Freq: Once | INTRAMUSCULAR | Status: AC
  Administered 2016-09-16: 60 mg via INTRAMUSCULAR

## 2016-09-16 MED ORDER — BUTALBITAL-APAP-CAFF-COD 50-300-40-30 MG PO CAPS: ORAL_CAPSULE | ORAL | 0 refills | Status: DC

## 2016-09-16 MED FILL — BUTALB-CAFF-ACETAMINOPH-COD: 50-325-40-3 | 3 days supply | Qty: 12 | Fill #0

## 2016-09-16 NOTE — Patient Instructions (Addendum)
For your ha today will we gave toradol 60 mg im.  Will get sed rate today.  Based on recent daily ha and tumor hx will get mri of head today or  tomorrow.(stat)  Will refer back to both neurosurgeon  And neurologist hopefully this week.  Rx fioricet for ha.  If ha worsens with signs and symptoms as discussed then ED evaluation.  Follow up 2 days or as needed

## 2016-09-16 NOTE — Telephone Encounter (Signed)
rx prednisone sent to pt pharmacy. 

## 2016-09-16 NOTE — Progress Notes (Signed)
Subjective:    Patient ID: Donald Mayer, male    DOB: Jul 03, 1985, 31 y.o.   MRN: [id number]  HPI  Pt in for follow up.   Pt in states he has been having headaches almost daily. I had written him modified duty. About 20 hours a week max and 4-5 hours a day for 5 days a week. Pt states he only could work 2 days one week. Then 3 days next week. But so far this week he has not gone to work since has ha. Pt states in past post surgery his HA's were but more occasional.    Pt states with  Headaches he has  light sensitivity. Pt states he is having ha now almost every day for past 2 weeks.Pt takes excedrin and he feels not helping much.(last  3 days waxing and waning ha). But not going away completley.  Pt has no syncope. No sudden vision changes. No gross motor or sensory function deficits.   Pt states past 2-3 weeks he having headaches in area of the surgery.   Pt never went to Dr. Kathyrn Sheriff has seen his neurosurgeon. He has not seen neurologist post hospitalization    Review of Systems  Constitutional: Negative for chills, fatigue and fever.  HENT: Negative for congestion and dental problem.   Respiratory: Negative for cough, chest tightness, shortness of breath and wheezing.   Cardiovascular: Negative for chest pain and palpitations.  Gastrointestinal: Negative for abdominal pain, nausea and vomiting.  Skin: Negative for rash.  Neurological: Positive for headaches. Negative for dizziness, speech difficulty, weakness and numbness.       Light sensitivity.  Psychiatric/Behavioral: Negative for agitation, behavioral problems, confusion, hallucinations and suicidal ideas. The patient is not hyperactive.    Past Medical History:  Diagnosis Date  . Back injuries      Social History   Social History  . Marital status: Single    Spouse name: N/A  . Number of children: N/A  . Years of education: N/A   Occupational History  . Not on file.   Social History Main Topics  .  Smoking status: Never Smoker  . Smokeless tobacco: Never Used  . Alcohol use Yes     Comment: occasional  . Drug use: No  . Sexual activity: Not on file   Other Topics Concern  . Not on file   Social History Narrative  . No narrative on file    Past Surgical History:  Procedure Laterality Date  . APPLICATION OF CRANIAL NAVIGATION N/A 05/04/2016   Procedure: APPLICATION OF CRANIAL NAVIGATION;  Surgeon: Consuella Lose, MD;  Location: [facility];  Service: Neurosurgery;  Laterality: N/A;  . ORIF WRIST FRACTURE Left 12/15/2012   Procedure: OPEN REDUCTION INTERNAL FIXATION (ORIF) WRIST FRACTURE; Left.  Reduction, repair, and reconnstruction of perilunate fracture dislocation;  Surgeon: Roseanne Kaufman, MD;  Location: [facility];  Service: Orthopedics;  Laterality: Left;  . STERIOTACTIC STIMULATOR INSERTION Right 05/04/2016   Procedure: STERIOTACTIC RIGHT FRONTAL CRANIOTOMY with BrainLab;  Surgeon: Consuella Lose, MD;  Location: [facility];  Service: Neurosurgery;  Laterality: Right;  right    Family History  Problem Relation Age of Onset  . Breast cancer Maternal Grandmother   . Lung cancer Maternal Grandfather   . Brain cancer Neg Hx     No Known Allergies  Current Outpatient Prescriptions on File Prior to Visit  Medication Sig Dispense Refill  . acetaminophen (TYLENOL) 500 MG tablet Take 1 tablet (500 mg total) by mouth every  6 (six) hours as needed. 30 tablet 0  . dexamethasone (DECADRON) 1 MG tablet Take 1 tablet (1 mg total) by mouth every 12 (twelve) hours. (Patient not taking: Reported on 08/26/2016) 6 tablet 0  . HYDROcodone-acetaminophen (NORCO/VICODIN) 5-325 MG tablet Take 1 tablet by mouth every 4 (four) hours as needed for moderate pain. 30 tablet 0  . levETIRAcetam (KEPPRA) 500 MG tablet Take 1 tablet (500 mg total) by mouth 2 (two) times daily. 60 tablet 1   No current facility-administered medications on file prior to visit.     BP 138/80   Pulse 100   Wt (!) 368 lb 12.8 oz  (167.3 kg)   SpO2 96%   BMI 48.66 kg/m       Objective:   Physical Exam  General Mental Status- Alert. General Appearance- Not in acute distress.   Skin General: Color- Normal Color. Moisture- Normal Moisture.  Neck Carotid Arteries- Normal color. Moisture- Normal Moisture. No carotid bruits. No JVD.  Chest and Lung Exam Auscultation: Breath Sounds:-Normal.  Cardiovascular Auscultation:Rythm- Regular. Murmurs & Other Heart Sounds:Auscultation of the heart reveals- No Murmurs.  Abdomen Inspection:-Inspeection Normal. Palpation/Percussion:Note:No mass. Palpation and Percussion of the abdomen reveal- Non Tender, Non Distended + BS, no rebound or guarding.  Neurologic Cranial Nerve exam:- CN III-XII intact(No nystagmus), symmetric smile. Light sensitive on exam(rt eye)(pain rt side frontal area and temporal area near surgical site) Drift Test:- No drift. Romberg Exam:- Negative.  Heal to Toe Gait exam:-Normal. Finger to Nose:- Normal/Intact Strength:- 5/5 equal and symmetric strength both upper and lower extremities.      Assessment & Plan:  For your ha today will we gave toradol 60 mg im.  Will get sed rate today.  Based on recent daily ha and tumor hx will get mri of head today or  tomorrow.(stat)  Will refer back to both neurosurgeon  And neurologist hopefully this week.  Rx fioricet for ha.  If ha worsens with signs and symptoms as discussed then ED evaluation.  Follow up 2 days or as needed  Pt actually was scheduled to have Mri tonight. Radiology given my cell number so they can call me. Asked that they keep pt until I get called.  Kobi Aller, Percell Miller, PA-C

## 2016-09-16 NOTE — Progress Notes (Signed)
Pre visit review using our clinic review tool, if applicable. No additional management support is needed unless otherwise documented below in the visit note. 

## 2016-09-16 NOTE — Telephone Encounter (Signed)
Pt sed rate elevated. He is scheduled for mri tomorrow correct. Will you go ahead and get him in with neurologist and neurosurgeon asap. He had sed rate elevated at 93.

## 2016-09-17 ENCOUNTER — Ambulatory Visit (HOSPITAL_COMMUNITY)
Admission: RE | Admit: 2016-09-17 | Discharge: 2016-09-17 | Disposition: A | Discharge status: Home / Self Care | Payer: 59 | Source: Ambulatory Visit | Attending: Medical | Admitting: Medical

## 2016-09-17 DIAGNOSIS — L568 Other specified acute skin changes due to ultraviolet radiation: Secondary | ICD-10-CM | POA: Insufficient documentation

## 2016-09-17 DIAGNOSIS — R51 Headache: Secondary | ICD-10-CM | POA: Insufficient documentation

## 2016-09-17 DIAGNOSIS — D496 Neoplasm of unspecified behavior of brain: Secondary | ICD-10-CM | POA: Diagnosis not present

## 2016-09-17 DIAGNOSIS — Z9889 Other specified postprocedural states: Secondary | ICD-10-CM | POA: Insufficient documentation

## 2016-09-17 DIAGNOSIS — R519 Headache, unspecified: Secondary | ICD-10-CM

## 2016-09-17 DIAGNOSIS — R6889 Other general symptoms and signs: Secondary | ICD-10-CM

## 2016-09-17 NOTE — Telephone Encounter (Signed)
[facility] 09/17/16 at 12:00pm/SLS 04/05  Patient Result Comments  Viewed by Rowan Blase on 09/16/2016 11:50 PM  Written by Mackie Pai, PA-C on 09/16/2016 10:54 PM  sed rate is elevated. With his ha must consider temporal arteritis. But he needs to follow through and get mri of head due to his brain tumor history and surgery. I will go ahead and prescribe prednisone rx. But we need to get him in with neurologist and neurosurgeon asap.

## 2016-09-17 NOTE — Telephone Encounter (Signed)
History of brain tumor and recurrent severe ha just recently. Mri done and shows no obvoius acute findings flare on mri that radiolgist thinks needs following to evaluate for possible recurrent tumor in future. Sed rate also elevated recently and some temporal area ha rt side. Pt needs to get in with neurologist and neurosurgeon asap. Will you call them tomorrow and see if they can get him in. Would you give me and pt update by 9 am.

## 2016-09-18 NOTE — Telephone Encounter (Signed)
Result Notes (MRI)  Notes recorded by Mackie Pai, PA-C on 09/17/2016 at 10:19 PM EDT I discussed with pt mom the report findings apologized for delay in getting back with her. Today was very busy. No on called me the stat result and I saw report after hours. Explained findings to mom. Explained also his sed rate was elevated sed. Rx prednisone sent to pharmacy and also trying to expidite referral back to neurosurgeon and neurologist.   Forwarded Note to patient reply/SLS 04/06

## 2016-09-19 ENCOUNTER — Inpatient Hospital Stay: Admission: RE | Admit: 2016-09-19 | Payer: Managed Care, Other (non HMO) | Source: Ambulatory Visit

## 2016-09-21 NOTE — Telephone Encounter (Signed)
Would you call pt and let him know I wanted him to see both neurologist and neurosurgeon. Very important since not sure if he should be working in light of his continued heachache.  Will you call neurologist and have him scheduled.

## 2016-09-22 NOTE — Telephone Encounter (Signed)
Pt is being seen in office tomorrow. Will you look at my schedule. When he is I office will you work on those referrals neurosurgeon and neurologist.. That way we can tell him on office these are date and explain importance to go.

## 2016-09-23 ENCOUNTER — Encounter: Payer: Self-pay | Admitting: Medical

## 2016-09-23 ENCOUNTER — Ambulatory Visit (INDEPENDENT_AMBULATORY_CARE_PROVIDER_SITE_OTHER): Payer: 59 | Admitting: Medical

## 2016-09-23 ENCOUNTER — Encounter: Payer: Self-pay | Admitting: Neurology

## 2016-09-23 VITALS — BP 137/87 | HR 95 | Temp 98.0°F | Resp 16 | Ht 73.0 in | Wt 370.6 lb

## 2016-09-23 DIAGNOSIS — D496 Neoplasm of unspecified behavior of brain: Secondary | ICD-10-CM

## 2016-09-23 DIAGNOSIS — R51 Headache: Secondary | ICD-10-CM | POA: Diagnosis not present

## 2016-09-23 DIAGNOSIS — R519 Headache, unspecified: Secondary | ICD-10-CM

## 2016-09-23 LAB — SEDIMENTATION RATE: Sed Rate: 46 mm/hr — ABNORMAL HIGH (ref 0–15)

## 2016-09-23 MED ORDER — PREDNISONE 20 MG PO TABS: ORAL_TABLET | ORAL | 0 refills | Status: DC

## 2016-09-23 NOTE — Patient Instructions (Addendum)
For your ha you can take fioricet. Use sparingly as we discussed. Not to drive after using/stay home in event you have to use.  Will check you sed rate today to see if it has come down.  Will try to again call both neurologist and neurosurgeon and get you as quickly as we can.   Will try to let HR or person handling your case know that you feel like you can't work and want specialist opinion. Letter printed and you could give them copy.  If you get HA with deficit type neurologic symptom then ED evaluation.  Follow up with me in 10 days or as needed. Hopefully you will have seen both your specialist by then.

## 2016-09-23 NOTE — Progress Notes (Signed)
Subjective:    Patient ID: Donald Mayer, male    DOB: 07/23/1985, 31 y.o.   MRN: [id number]  HPI   Pt in for a follow up.  Pt still having daily ha on and off as described on last visit. No gross motor or sensory function deficits reported.  Pt did update me that toradol did stop his headache the other day but ha came back later that day.  Pt state the fioricet will also stop his ha but sedates him and he will go to sleep. Then he wake up and HA resolved.  Pt last HA was yesterday morning.   Pt sed rate was elevated on last visit and I rx prednisone.  We have put in referrals to both neurologist and neurosurgeon. Delay in getting both scheduled although have been trying..   Pt does not want to return to work until he gets word from specialist. He states even a couple of hours into shift he would get ha. He thinks Lamonte Sakai more intense at work looking at screens than when at home.  MRI repeat day of last visit showed. Post resective cavity in the right frontal region is the same size and shape.  Low level T2 and FLAIR signal within the adjacent brain, particularly superior, could be a sequela of the previous surgery. However, continued observation is warranted as residual/recurrent tumor could have a similar appearance.   Review of Systems  Constitutional: Negative for chills, fatigue and fever.  Respiratory: Negative for cough, chest tightness, shortness of breath and wheezing.   Cardiovascular: Negative for chest pain and palpitations.  Gastrointestinal: Negative for abdominal pain.  Musculoskeletal: Negative for arthralgias and back pain.  Skin: Negative for rash.  Neurological: Negative for dizziness, seizures, syncope, speech difficulty, weakness, light-headedness, numbness and headaches.       Last ha was yesterday morning.  Psychiatric/Behavioral: Negative for behavioral problems and confusion.    Past Medical History:  Diagnosis Date  . Back injuries        Social History   Social History  . Marital status: Single    Spouse name: N/A  . Number of children: N/A  . Years of education: N/A   Occupational History  . Not on file.   Social History Main Topics  . Smoking status: Never Smoker  . Smokeless tobacco: Never Used  . Alcohol use Yes     Comment: occasional  . Drug use: No  . Sexual activity: Not on file   Other Topics Concern  . Not on file   Social History Narrative  . No narrative on file    Past Surgical History:  Procedure Laterality Date  . APPLICATION OF CRANIAL NAVIGATION N/A 05/04/2016   Procedure: APPLICATION OF CRANIAL NAVIGATION;  Surgeon: Consuella Lose, MD;  Location: [place];  Service: Neurosurgery;  Laterality: N/A;  . ORIF WRIST FRACTURE Left 12/15/2012   Procedure: OPEN REDUCTION INTERNAL FIXATION (ORIF) WRIST FRACTURE; Left.  Reduction, repair, and reconnstruction of perilunate fracture dislocation;  Surgeon: Roseanne Kaufman, MD;  Location: [place];  Service: Orthopedics;  Laterality: Left;  . STERIOTACTIC STIMULATOR INSERTION Right 05/04/2016   Procedure: STERIOTACTIC RIGHT FRONTAL CRANIOTOMY with BrainLab;  Surgeon: Consuella Lose, MD;  Location: [place];  Service: Neurosurgery;  Laterality: Right;  right    Family History  Problem Relation Age of Onset  . Breast cancer Maternal Grandmother   . Lung cancer Maternal Grandfather   . Brain cancer Neg Hx     No Known Allergies  Current Outpatient Prescriptions on File Prior to Visit  Medication Sig Dispense Refill  . acetaminophen (TYLENOL) 500 MG tablet Take 1 tablet (500 mg total) by mouth every 6 (six) hours as needed. 30 tablet 0  . butalbital-acetaminophen-caffeine (FIORICET WITH CODEINE) 50-325-40-30 MG capsule 1 tab po q 6 hours as needed ha 12 capsule 0  . levETIRAcetam (KEPPRA) 500 MG tablet Take 1 tablet (500 mg total) by mouth 2 (two) times daily. 60 tablet 1  . predniSONE (DELTASONE) 20 MG tablet 1 tab po tid x 7 days 21 tablet 0  .  HYDROcodone-acetaminophen (NORCO/VICODIN) 5-325 MG tablet Take 1 tablet by mouth every 4 (four) hours as needed for moderate pain. 30 tablet 0   No current facility-administered medications on file prior to visit.     BP 137/87 (BP Location: Left Arm, Cuff Size: Large)   Pulse 95   Temp 98 F (36.7 C) (Oral)   Resp 16   Ht 6\' 1"  (1.854 m)   Wt (!) 370 lb 9.6 oz (168.1 kg)   SpO2 97%   BMI 48.89 kg/m       Objective:   Physical Exam  General Mental Status- Alert. General Appearance- Not in acute distress.   Skin General: Color- Normal Color. Moisture- Normal Moisture.  Neck Carotid Arteries- Normal color. Moisture- Normal Moisture. No carotid bruits. No JVD.  Chest and Lung Exam Auscultation: Breath Sounds:-Normal.  Cardiovascular Auscultation:Rythm- Regular. Murmurs & Other Heart Sounds:Auscultation of the heart reveals- No Murmurs.  Abdomen Inspection:-Inspeection Normal. Palpation/Percussion:Note:No mass. Palpation and Percussion of the abdomen reveal- Non Tender, Non Distended + BS, no rebound or guarding.   Neurologic Cranial Nerve exam:- CN III-XII intact(No nystagmus), symmetric smile. Drift Test:- No drift. Romberg Exam:- Negative.  Heal to Toe Gait exam:-Normal. Finger to Nose:- Normal/Intact Strength:- 5/5 equal and symmetric strength both upper and lower extremities.  Skin- area around scar looks normal. No skin lesion. No vesicles. No dilated veins in temporal area.      Assessment & Plan:  For your ha you can take fioricet. Use sparingly as we discussed. Not to drive after using/stay home in event you have to use.  Will check you sed rate today to see if it has come down.  Will try to again call both neurologist and neurosurgeon and get you as quickly as we can.   Will try to let HR or person handling your case know that you feel like you can't work and want specialist opinion. Letter printed and you could give them copy.  If you get HA with  deficit type neurologic symptom then ED evaluation.  Follow up with me in 10 days or as needed. Hopefully you will have seen both your specialist by then.  Dashiel Bergquist, Percell Miller, PA-C

## 2016-09-23 NOTE — Progress Notes (Signed)
Pre visit review using our clinic review tool, if applicable. No additional management support is needed unless otherwise documented below in the visit note. 

## 2016-09-25 ENCOUNTER — Telehealth: Payer: Self-pay | Admitting: Medical

## 2016-09-25 ENCOUNTER — Telehealth: Payer: Self-pay | Admitting: *Deleted

## 2016-09-25 NOTE — Telephone Encounter (Signed)
-----   Message from Mackie Pai, PA-C sent at 09/23/2016  9:21 PM EDT ----- Pt sed rate is elevated. I will prescribe him another 5 days of prednisone. His sed rate down from 93 to 46 but is still elevated. Ask pt to clarify whether he wants to work part time or not until he sees specialist. He told me he felt he could not work presently and wanted to get specialist opinion first so  I can't fill out forms until he sees specialist if he won't be doing part time work.

## 2016-09-25 NOTE — Telephone Encounter (Signed)
New paperwork received and placed in Donald Mayer's red folder for review and completion. Notified patient of the same.  Also made pt aware that provider will out of the office until Monday.  Pt states that as long as paperwork is turned in on Monday, everything should be fine.  He appreciated the call and update.  No additional needs voiced at this time.

## 2016-09-25 NOTE — Telephone Encounter (Signed)
I have not seen paperwork. In fact was there today at 6:30 am getting something from my office and checked my folders before leaving town. Looked on [employer] also and did not see.   In addition pt told me on last visit. He did not feel like he could return to work. He expressed he wanted to get clearance from specialist first. Specialist both neurosurgeon and neurologist have both reviewed his case and per Anderson Malta they don't see the need for urgent appointment.   The form we got the other day I understood was to put him back to work. So I did not ask Ashlee for the form as he stated to me he was not ready to go back to work.   If he changed his mind I can fill out the form on Monday. But impossible as I am not in the office. If you could get number of person in HR and call them explaining the situation. Maybe they would go ahead and process his paycheck.  Also explain the above to patient.

## 2016-09-25 NOTE — Telephone Encounter (Signed)
I do not have any updated paperwork for patient, I had only the [employer] paperwork requesting 09/05/16 OV notes, which were faxed on 09/14/16 when I was assisting Percell Miller in clinic. I am not aware of any new paperwork, as I have not been working with Percell Miller during the timeframe patient is inquiring about/SLS 04/13

## 2016-09-25 NOTE — Telephone Encounter (Signed)
See note I just sent to you. Also touch base with Ashlee as I understood she may have had a form.

## 2016-09-25 NOTE — Telephone Encounter (Signed)
Advised patient about results.  He stated that he would like to be out of work until he see specialist because he is still having the headaches.

## 2016-09-25 NOTE — Telephone Encounter (Signed)
Please see 09/14/16 phone note.

## 2016-09-25 NOTE — Telephone Encounter (Signed)
fwding to [employer]. I do not feel comfortable discussing health information since I'm non-clinical.

## 2016-09-25 NOTE — Telephone Encounter (Signed)
Pt in office stating that this needs to be faxed in today. He thought it would be sent in 09/23/16 after his OV with Percell Miller. Pt requesting it be sent in asap today as his employer states it is needed in order for processing him to get payout for Monday.  Pt requesting call today to let him know.  Pt phone # [phone]

## 2016-09-28 NOTE — Telephone Encounter (Signed)
Form faxed to [facility] [phone]

## 2016-09-28 NOTE — Telephone Encounter (Signed)
I talked with pt and he feels like he can't work. He states his employer is going to allow him short term disability until he can get in with specialist.  Please fax over form. Placing form on your desk.

## 2016-09-28 NOTE — Telephone Encounter (Signed)
Patient called in to check up on the status of his FMLA form. Stated his employer is saying he has not received the paperwork yet. Patient was seen last week  Please advise    Call back number [phone]

## 2016-09-28 NOTE — Telephone Encounter (Signed)
Note from 09/23/16 has been faxed.//AB/CMA

## 2016-09-28 NOTE — Telephone Encounter (Signed)
Please fax office notes from 4/11 to [facility] @ 1985

## 2016-09-29 NOTE — Telephone Encounter (Signed)
September 28, 2016  Harl Bowie, [place]  4:51 PM  Note    Note from 09/23/16 has been faxed.//AB/CMA         4:14 PM  Quintin Alto routed this conversation to Harl Bowie, [place]  4:13 PM  Note    Please fax office notes from 4/11 to [facility] @ [id number]/1985      [place]  2:37 PM  Note    Form faxed to [employer] [phone]

## 2016-10-04 ENCOUNTER — Ambulatory Visit
Admission: RE | Admit: 2016-10-04 | Discharge: 2016-10-04 | Disposition: A | Discharge status: Home / Self Care | Payer: 59 | Source: Ambulatory Visit | Attending: Sports Medicine | Admitting: Sports Medicine

## 2016-10-04 DIAGNOSIS — M25561 Pain in right knee: Secondary | ICD-10-CM

## 2016-10-09 ENCOUNTER — Telehealth: Payer: Self-pay | Admitting: *Deleted

## 2016-10-09 NOTE — Telephone Encounter (Signed)
lvm advising patient to schedule appointment °

## 2016-10-09 NOTE — Telephone Encounter (Signed)
Patient scheduled for 10/12/16 with PCP

## 2016-10-09 NOTE — Telephone Encounter (Signed)
Received faxed from [employer], requesting Surgical Clearance for patient's surgery to be scheduled pending clearance; forwarding paperwork to provider's assistant. Please call patient and schedule 30- minute appointment with PCP/SLS 04/27 Thanks.

## 2016-10-12 ENCOUNTER — Telehealth: Payer: Self-pay | Admitting: Medical

## 2016-10-12 ENCOUNTER — Ambulatory Visit (INDEPENDENT_AMBULATORY_CARE_PROVIDER_SITE_OTHER): Payer: 59 | Admitting: Medical

## 2016-10-12 VITALS — BP 130/80 | HR 80 | Temp 98.3°F | Resp 16 | Ht 75.0 in | Wt 375.8 lb

## 2016-10-12 DIAGNOSIS — Z01818 Encounter for other preprocedural examination: Secondary | ICD-10-CM

## 2016-10-12 DIAGNOSIS — G8929 Other chronic pain: Secondary | ICD-10-CM

## 2016-10-12 DIAGNOSIS — M25561 Pain in right knee: Secondary | ICD-10-CM | POA: Diagnosis not present

## 2016-10-12 NOTE — Telephone Encounter (Signed)
Pt seen today for preop for rt knee surgery. Still has daily ha. When are his specialist seeing him for his persistent daily  HA's. Pt states end of June?. Will you double check?

## 2016-10-12 NOTE — Progress Notes (Signed)
Subjective:    Patient ID: Donald Mayer, male    DOB: 02/06/1986, 31 y.o.   MRN: [id number]  HPI  Pt in for follow up.   Pt states he is going to have knee surgery. Pt states he will have surgery upcoming this year but date to be determined. He needs clearance from pcp and from his neurologist.  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 Underlying subchondral edema is identified.  Pt has been having pain in this knee for about 10 years. Pain has been getting worse over past 2 years. Acute worsening over this  past month. Hurts to walk.  Pt had general anesthesia twice in his life.One for his left wrist and another time for brain surgery. No problems with general anesthesia.  No history of asthma. No history of sudden death in family. No family hx of any negative reaction to anesthesia. No personal  hx of heart murmur. Hx of syncope but only related to brain surgery.      Review of Systems  Constitutional: Negative for chills, fatigue and fever.  HENT: Negative for congestion, mouth sores and nosebleeds.   Respiratory: Negative for cough, chest tightness, shortness of breath and wheezing.   Cardiovascular: Negative for chest pain and palpitations.  Gastrointestinal: Negative for abdominal pain, diarrhea, nausea and vomiting.  Genitourinary: Negative for dysuria and flank pain.  Musculoskeletal:       Rt knee pain.  Neurological: Positive for headaches. Negative for dizziness, tremors, seizures, syncope, facial asymmetry, speech difficulty, weakness and numbness.       Same daily recurrent mild-moderate ha as before(discussed on prior visit).  Hematological: Negative for adenopathy. Does not bruise/bleed easily.  Psychiatric/Behavioral: Negative for behavioral problems, confusion, decreased concentration, dysphoric mood, self-injury and suicidal ideas.     Past Medical History:  Diagnosis Date  . Back injuries      Social History   Social History  . Marital status: Single    Spouse name: N/A  . Number of children: N/A  . Years of education: N/A   Occupational History  . Not on file.   Social History Main Topics  . Smoking status: Never Smoker  . Smokeless tobacco: Never Used  . Alcohol use Yes     Comment: occasional  . Drug use: No  . Sexual activity: Not on file   Other Topics Concern  . Not on file   Social History Narrative  . No narrative on file    Past Surgical History:  Procedure Laterality Date  . APPLICATION OF CRANIAL NAVIGATION N/A 05/04/2016   Procedure: APPLICATION OF CRANIAL NAVIGATION;  Surgeon: Consuella Lose, MD;  Location: [place];  Service: Neurosurgery;  Laterality: N/A;  . ORIF WRIST FRACTURE Left 12/15/2012   Procedure: OPEN REDUCTION INTERNAL FIXATION (ORIF) WRIST FRACTURE; Left.  Reduction, repair, and reconnstruction of perilunate fracture dislocation;  Surgeon: Roseanne Kaufman, MD;  Location: [place];  Service: Orthopedics;  Laterality: Left;  . STERIOTACTIC STIMULATOR INSERTION Right 05/04/2016   Procedure: STERIOTACTIC RIGHT FRONTAL CRANIOTOMY with BrainLab;  Surgeon: Consuella Lose, MD;  Location: [place];  Service: Neurosurgery;  Laterality: Right;  right    Family History  Problem Relation Age of Onset  . Breast cancer Maternal Grandmother   . Lung cancer Maternal Grandfather   . Brain cancer Neg Hx     No Known Allergies  Current Outpatient  Prescriptions on File Prior to Visit  Medication Sig Dispense Refill  . acetaminophen (TYLENOL) 500 MG tablet Take 1 tablet (500 mg total) by mouth every 6 (six) hours as needed. 30 tablet 0  . butalbital-acetaminophen-caffeine (FIORICET WITH CODEINE) 50-325-40-30 MG capsule 1 tab po q 6 hours as needed ha 12 capsule 0  . levETIRAcetam (KEPPRA) 500 MG tablet Take 1 tablet (500 mg total) by mouth 2 (two) times daily. 60 tablet 1  . predniSONE  (DELTASONE) 20 MG tablet 1 tab po tid x 5 15 tablet 0   No current facility-administered medications on file prior to visit.     BP 130/80 (BP Location: Right Arm, Patient Position: Sitting, Cuff Size: Large)   Pulse 80   Temp 98.3 F (36.8 C) (Oral)   Resp 16   Ht 6\' 3"  (1.905 m)   Wt (!) 375 lb 12.8 oz (170.5 kg)   SpO2 98%   BMI 46.97 kg/m       Objective:   Physical Exam   General Mental Status- Alert. General Appearance- Not in acute distress.   Skin General: Color- Normal Color. Moisture- Normal Moisture.  Neck Carotid Arteries- Normal color. Moisture- Normal Moisture. No carotid bruits. No JVD.  Chest and Lung Exam Auscultation: Breath Sounds:-Normal.  Cardiovascular Auscultation:Rythm- Regular. Murmurs & Other Heart Sounds:Auscultation of the heart reveals- No Murmurs.  Abdomen Inspection:-Inspeection Normal. Palpation/Percussion:Note:No mass. Palpation and Percussion of the abdomen reveal- Non Tender, Non Distended + BS, no rebound or guarding.    Neurologic Cranial Nerve exam:- CN III-XII intact(No nystagmus), symmetric smile. Strength:- 5/5 equal and symmetric strength both upper and lower extremities.  Rt knee- crepitus- on flexion and extension.     Assessment & Plan:  ekg- nsr. Nonspecific st abnormality. See lead III in past. No changes from piror ekg this year.  I wrote a letter clearing you for surgery on your knee. I do also want your neurologist/specialist to clear  for surgery as well. Your mentioned orthopedist wanted this and this would be a good idea.   I will ask Anderson Malta to check on your referral appointments to your specialist again.  Follow up with [place] as needed before or after neurologist/neurosurgeon visits.  Note since this surgery is elective you might want to wait and see your specialist first then schedule for mid or late summer.

## 2016-10-12 NOTE — Progress Notes (Signed)
Pre visit review using our clinic review tool, if applicable. No additional management support is needed unless otherwise documented below in the visit note. 

## 2016-10-12 NOTE — Patient Instructions (Addendum)
I wrote a letter clearing you for surgery on your knee. I do also want your neurologist/specialist to clear  for surgery as well. Your mentioned orthopedist wanted this and this would be a good idea.   I will ask Anderson Malta to check on your referral appointments to your specialist again.  Follow up with [place] as needed before or after neurologist/neurosurgeon visits.  Note since this surgery is elective you might want to wait and see your specialist first then schedule for mid or late summer.

## 2016-10-13 NOTE — Telephone Encounter (Signed)
12/07/16 with Dr Tomi Likens

## 2016-10-26 ENCOUNTER — Telehealth: Payer: Self-pay | Admitting: Medical

## 2016-10-26 NOTE — Telephone Encounter (Signed)
Pt has history of brain tumor. He has per mom more lethargy and getting ha more frequent. He needs 8:30 appointment with me. Complicated pt and due to nature of complaint needs 30 minutes. So can take that new pt appointment slot on this wed Oct 28, 2016. Please give him that slot and notify him.

## 2016-10-27 NOTE — Telephone Encounter (Signed)
Called patient.  He was in agreement with being seen tomorrow morning (10/28/16) at 8:30 am.  Appt scheduled.  No additional needs voiced at this time.

## 2016-10-28 ENCOUNTER — Ambulatory Visit: Payer: 59 | Admitting: Medical

## 2016-10-29 ENCOUNTER — Encounter: Payer: Self-pay | Admitting: Medical

## 2016-10-29 ENCOUNTER — Ambulatory Visit (INDEPENDENT_AMBULATORY_CARE_PROVIDER_SITE_OTHER): Payer: 59 | Admitting: Medical

## 2016-10-29 ENCOUNTER — Telehealth: Payer: Self-pay | Admitting: Medical

## 2016-10-29 VITALS — BP 140/82 | HR 93 | Temp 98.2°F | Resp 16 | Ht 75.0 in | Wt 377.2 lb

## 2016-10-29 DIAGNOSIS — R51 Headache: Secondary | ICD-10-CM | POA: Diagnosis not present

## 2016-10-29 DIAGNOSIS — R002 Palpitations: Secondary | ICD-10-CM

## 2016-10-29 DIAGNOSIS — Z87898 Personal history of other specified conditions: Secondary | ICD-10-CM | POA: Diagnosis not present

## 2016-10-29 DIAGNOSIS — R519 Headache, unspecified: Secondary | ICD-10-CM

## 2016-10-29 NOTE — Patient Instructions (Addendum)
For your recurrent persitent HA and other symptoms you describe I am again going to contact neurosurgeon and neurologist office and send copy of this note. My hope is they will move up you appointment within a week. If not then will go ahead and try to repeat mri which was done on 09-17-2016.  You can continue excedrin for mild ha and fiorcet for more severe ha.  If you do have severe ha or if you get any gross motor or sensory deficits as explained then ED evaluation.   Regarding your palpitation sensation. Your ekg again shows normal sinus rhythm. So significant change compared to last month. I will go ahead and refer you to cardiologist as they might do holter monitor and with new palpitation surgical clearance  for potential knee surgery may be issue. Please avoid all caffeine products and decongestant type products.  Follow up with me in one week or as needed. Will try to get quick feedback from specialist and notify you if we are repeating your mri.

## 2016-10-29 NOTE — Telephone Encounter (Signed)
Office notes faxed to [facility], attn: Crystal, awaiting review

## 2016-10-29 NOTE — Progress Notes (Signed)
Subjective:    Patient ID: Donald Mayer, male    DOB: 05-07-1986, 31 y.o.   MRN: [id number]  HPI  Pt in for follow up. Made one for yesterday but he missed. Rescheduled today. Pt states his headaches are getting worse. More intense ha and more frequent. Pt agrees with his mother report. I talked with mom the other day and then we got him scheduled. Pt also states his vision. He states vision seems to be worsening. Pt having lethargy again. He notes this was a symptom he had when he originally passed out before diagnosis of brain tumor and had lethargy days preceding his initial visit with me months ago. I had seen pt on 09-23-2016. Based on complaints on last visit I got mri of his head and showed. Pt states most of his ha is on rt side of head/parietal area.   "Post resective cavity in the right frontal region is the same size and shape.  Low level T2 and FLAIR signal within the adjacent brain, particularly superior, could be a sequela of the previous surgery. However, continued observation is warranted as residual/recurrent tumor could have a similar appearance."  I  Reviewed report and understood post resective cavity but aware of residual/recurrent tumor could have similar appearance.   I had tried refer pt to both neurosurgeon and neurologist. I had asked our referral staff to contact their office various times. Staff told me appointment made but not any time immediatley since after review they considered referral non emergent.  Pt is currently on short disability since looking at screens causes ha.  I had written pt for fiorcet but he states on occasion but makes him too drowsy.  Pt tells me presently he is having some level of ha all the time. With increased level at time.   Pt also states he will randomly one second palpitation that last for about a second then goes away. About every other night for about 3 weeks. He noticing laying in bed when things are quite and he  tries to go to sleep but no chest pains. No sob. No associated cardiac signs or symptoms. No coffee. He drinks rare. No prior hx of palpitations.   Review of Systems  Constitutional: Negative for chills, fatigue and fever.  Eyes: Positive for visual disturbance. Negative for photophobia, pain, discharge, redness and itching.       Gradual worse vision disturbance. But no other described visual deficit.  Respiratory: Negative for cough, choking, shortness of breath and wheezing.   Cardiovascular: Positive for palpitations. Negative for chest pain.       None now but see hpi.  Gastrointestinal: Negative for abdominal pain, blood in stool and constipation.  Musculoskeletal: Negative for back pain.  Neurological: Positive for headaches. Negative for dizziness, syncope, speech difficulty, weakness and light-headedness.  Hematological: Negative for adenopathy. Does not bruise/bleed easily.  Psychiatric/Behavioral: Positive for confusion and sleep disturbance. Negative for behavioral problems, dysphoric mood and suicidal ideas. The patient is not nervous/anxious and is not hyperactive.        Ha often disturbs sleep.   Past Medical History:  Diagnosis Date  . Back injuries      Social History   Social History  . Marital status: Single    Spouse name: N/A  . Number of children: N/A  . Years of education: N/A   Occupational History  . Not on file.   Social History Main Topics  . Smoking status: Never Smoker  . Smokeless  tobacco: Never Used  . Alcohol use Yes     Comment: occasional  . Drug use: No  . Sexual activity: Not on file   Other Topics Concern  . Not on file   Social History Narrative  . No narrative on file    Past Surgical History:  Procedure Laterality Date  . APPLICATION OF CRANIAL NAVIGATION N/A 05/04/2016   Procedure: APPLICATION OF CRANIAL NAVIGATION;  Surgeon: Consuella Lose, MD;  Location: [facility];  Service: Neurosurgery;  Laterality: N/A;  . ORIF WRIST  FRACTURE Left 12/15/2012   Procedure: OPEN REDUCTION INTERNAL FIXATION (ORIF) WRIST FRACTURE; Left.  Reduction, repair, and reconnstruction of perilunate fracture dislocation;  Surgeon: Roseanne Kaufman, MD;  Location: [facility];  Service: Orthopedics;  Laterality: Left;  . STERIOTACTIC STIMULATOR INSERTION Right 05/04/2016   Procedure: STERIOTACTIC RIGHT FRONTAL CRANIOTOMY with BrainLab;  Surgeon: Consuella Lose, MD;  Location: [facility];  Service: Neurosurgery;  Laterality: Right;  right    Family History  Problem Relation Age of Onset  . Breast cancer Maternal Grandmother   . Lung cancer Maternal Grandfather   . Brain cancer Neg Hx     No Known Allergies  Current Outpatient Prescriptions on File Prior to Visit  Medication Sig Dispense Refill  . acetaminophen (TYLENOL) 500 MG tablet Take 1 tablet (500 mg total) by mouth every 6 (six) hours as needed. 30 tablet 0  . butalbital-acetaminophen-caffeine (FIORICET WITH CODEINE) 50-325-40-30 MG capsule 1 tab po q 6 hours as needed ha 12 capsule 0  . levETIRAcetam (KEPPRA) 500 MG tablet Take 1 tablet (500 mg total) by mouth 2 (two) times daily. 60 tablet 1  . predniSONE (DELTASONE) 20 MG tablet 1 tab po tid x 5 15 tablet 0   No current facility-administered medications on file prior to visit.     BP 140/82 (BP Location: Left Arm, Patient Position: Sitting, Cuff Size: Large)   Pulse 93   Temp 98.2 F (36.8 C) (Oral)   Resp 16   Ht 6\' 3"  (1.905 m)   Wt (!) 377 lb 3.2 oz (171.1 kg)   SpO2 98%   BMI 47.15 kg/m       Objective:   Physical Exam  General Mental Status- Alert. General Appearance- Not in acute distress.   Eyes- 20/70 separate each eye. Both eyes 20/50.  Skin General: Color- Normal Color. Moisture- Normal Moisture.  Neck Carotid Arteries- Normal color. Moisture- Normal Moisture. No carotid bruits. No JVD.  Chest and Lung Exam Auscultation: Breath Sounds:-Normal.  Cardiovascular Auscultation:Rythm- Regular. Murmurs &  Other Heart Sounds:Auscultation of the heart reveals- No Murmurs.  Abdomen Inspection:-Inspeection Normal. Palpation/Percussion:Note:No mass. Palpation and Percussion of the abdomen reveal- Non Tender, Non Distended + BS, no rebound or guarding.   Neurologic Cranial Nerve exam:- CN III-XII intact(No nystagmus), symmetric smile. Drift Test:- No drift. Romberg Exam:- Negative.  Heal to Toe Gait exam:-Normal. Finger to Nose:- Normal/Intact Strength:- 5/5 equal and symmetric strength both upper and lower extremities.      Assessment & Plan:  For your recurrent persitent HA and other symptoms you describe I am again going to contact neurosurgeon and neurologist office and send copy of this note. My hope is they will move up you appointment within a week. If not then will go ahead and try to repeat mri which was done on 09-17-2016.  You can continue excedrin for mild ha and fiorcet for more severe ha.  If you do have severe ha or if you get any gross motor  or sensory deficits as explained then ED evaluation.   Regarding your palpitation sensation. Your ekg again shows normal sinus rhythm. So significant change compared to last month. I will go ahead and refer you to cardiologist as they might do holter monitor and with new palpitation surgical clearance  for potential knee surgery may be issue.  Please avoid all caffeine products and decongestant type products.  Follow up with me in one week or as needed. Will try to get quick feedback from specialist and notify you if we are repeating your mri.  Livier Hendel, Percell Miller, PA-C

## 2016-10-29 NOTE — Telephone Encounter (Signed)
I am done with the note. If they agree to see him let me know the date. Since I am considering reordering the mri of head.

## 2016-11-03 ENCOUNTER — Encounter: Payer: Self-pay | Admitting: Interventional Cardiology

## 2016-11-03 ENCOUNTER — Ambulatory Visit (INDEPENDENT_AMBULATORY_CARE_PROVIDER_SITE_OTHER): Payer: 59 | Admitting: Interventional Cardiology

## 2016-11-03 DIAGNOSIS — R002 Palpitations: Secondary | ICD-10-CM | POA: Insufficient documentation

## 2016-11-03 NOTE — Progress Notes (Signed)
Cardiology Office Note   Date:  11/03/2016   ID:  Donald Mayer, Donald Mayer 06-14-1986, MRN [id number]  PCP:  Mackie Pai, PA-C    No chief complaint on file. palpitations   Wt Readings from Last 3 Encounters:  11/03/16 (!) 381 lb 3.2 oz (172.9 kg)  10/29/16 (!) 377 lb 3.2 oz (171.1 kg)  10/12/16 (!) 375 lb 12.8 oz (170.5 kg)       History of Present Illness: Donald Mayer is a 31 y.o. male  Who has noticed palpitations during the day.  He has to cough every time there is a skipped beat.  He has had a brain tumor removed.  He is having more headaches and has a workup planned.    He does not drink caffeine.  He walks for exercise.  He is trying to lose weight.  He gained weight while on steroids for the tumor.  He is not taking the steroids anymore.   No lightheadedness or syncope. No sustained arrhythmia sensation. No shortness of breath, lower extremity edema.  He can have random sharp pains in the right side of his chest. They're not related to exertion.  Donald Mayer is a 31 y.o. male who is being seen today for the evaluation of palpitations at the request of Saguier, Percell Miller, [place].     Past Medical History:  Diagnosis Date  . Back injuries     Past Surgical History:  Procedure Laterality Date  . APPLICATION OF CRANIAL NAVIGATION N/A 05/04/2016   Procedure: APPLICATION OF CRANIAL NAVIGATION;  Surgeon: Consuella Lose, MD;  Location: [facility];  Service: Neurosurgery;  Laterality: N/A;  . ORIF WRIST FRACTURE Left 12/15/2012   Procedure: OPEN REDUCTION INTERNAL FIXATION (ORIF) WRIST FRACTURE; Left.  Reduction, repair, and reconnstruction of perilunate fracture dislocation;  Surgeon: Roseanne Kaufman, MD;  Location: [facility];  Service: Orthopedics;  Laterality: Left;  . STERIOTACTIC STIMULATOR INSERTION Right 05/04/2016   Procedure: STERIOTACTIC RIGHT FRONTAL CRANIOTOMY with BrainLab;  Surgeon: Consuella Lose, MD;  Location: [facility];  Service: Neurosurgery;   Laterality: Right;  right     Current Outpatient Prescriptions  Medication Sig Dispense Refill  . butalbital-acetaminophen-caffeine (FIORICET WITH CODEINE) 50-325-40-30 MG capsule 1 tab po q 6 hours as needed ha 12 capsule 0  . levETIRAcetam (KEPPRA) 500 MG tablet Take 1 tablet (500 mg total) by mouth 2 (two) times daily. 60 tablet 1   No current facility-administered medications for this visit.     Allergies:   Patient has no known allergies.    Social History:  The patient  reports that he has never smoked. He has never used smokeless tobacco. He reports that he drinks alcohol. He reports that he does not use drugs.   Family History:  The patient's family history includes Breast cancer in his maternal grandmother; Lung cancer in his maternal grandfather.    ROS:  Please see the history of present illness.   Otherwise, review of systems are positive for palpitations.   All other systems are reviewed and negative.    PHYSICAL EXAM: VS:  BP (!) 142/90 (BP Location: Left Arm, Patient Position: Sitting, Cuff Size: Large)   Pulse 90   Ht 6\' 3"  (1.905 m)   Wt (!) 381 lb 3.2 oz (172.9 kg)   SpO2 94%   BMI 47.65 kg/m  , BMI Body mass index is 47.65 kg/m. GEN: Well nourished, well developed, in no acute distress  HEENT: normal  Neck: no JVD, carotid bruits,  or masses Cardiac: RRR; no murmurs, rubs, or gallops,no edema  Respiratory:  clear to auscultation bilaterally, normal work of breathing GI: soft, nontender, nondistended, + BS, obese MS: no deformity or atrophy  Skin: warm and dry, no rash Neuro:  Strength and sensation are intact Psych: euthymic mood, full affect     Recent Labs: 04/30/2016: TSH 1.203 05/05/2016: ALT 23; BUN 13; Creatinine, Ser 1.29; Hemoglobin 12.6; Platelets 242; Potassium 4.0; Sodium 134   Lipid Panel No results found for: CHOL, TRIG, HDL, CHOLHDL, VLDL, LDLCALC, LDLDIRECT   Other studies Reviewed: Additional studies/ records that were reviewed  today with results demonstrating: Prior ECG from May 2018 reviewed showing normal sinus rhythm without any arrhythmia.   ASSESSMENT AND PLAN:  1. Palpitations: Symptoms sound single isolated PVCs or PACs. Given his other medical history, we will check 24-hour Holter monitor. He feels the symptoms every day. I instructed him to make note of the times when he has these symptoms. Exam is normal. No sign of structural heart disease.  Would like to hold off on using any medication if his symptoms do turn out to just be PACs or PVCs, would likely give reassurance. 2. Obesity:  We talked about decreasing intake of calories to help lose weight.  He also needsto get back to regular exercise.   3. Right-sided chest pain noted above sound very atypical and nonischemic.    Current medicines are reviewed at length with the patient today.  The patient concerns regarding his medicines were addressed.  The following changes have been made:  No change  Labs/ tests ordered today include:  No orders of the defined types were placed in this encounter.   Recommend 150 minutes/week of aerobic exercise Low fat, low carb, high fiber diet recommended  Disposition:   FU based on results of monitor   Signed, Larae Grooms, MD  11/03/2016 4:30 PM    [facility] [address], [place], [place]  [address] Phone: [phone]; Fax: [phone]

## 2016-11-03 NOTE — Patient Instructions (Signed)
Medication Instructions:  Your physician recommends that you continue on your current medications as directed. Please refer to the Current Medication list given to you today.   Labwork: None ordered  Testing/Procedures: Your physician has recommended that you wear a holter monitor. Holter monitors are medical devices that record the heart's electrical activity. Doctors most often use these monitors to diagnose arrhythmias. Arrhythmias are problems with the speed or rhythm of the heartbeat. The monitor is a small, portable device. You can wear one while you do your normal daily activities. This is usually used to diagnose what is causing palpitations/syncope (passing out).    Follow-Up: As needed. Based on the holter monitor results.  Any Other Special Instructions Will Be Listed Below (If Applicable).     If you need a refill on your cardiac medications before your next appointment, please call your pharmacy.

## 2016-11-18 ENCOUNTER — Ambulatory Visit (INDEPENDENT_AMBULATORY_CARE_PROVIDER_SITE_OTHER): Payer: 59

## 2016-11-18 DIAGNOSIS — R002 Palpitations: Secondary | ICD-10-CM | POA: Diagnosis not present

## 2016-11-19 ENCOUNTER — Other Ambulatory Visit: Payer: Self-pay | Admitting: Neurosurgery

## 2016-11-19 DIAGNOSIS — D332 Benign neoplasm of brain, unspecified: Secondary | ICD-10-CM

## 2016-12-07 ENCOUNTER — Ambulatory Visit: Payer: Managed Care, Other (non HMO) | Admitting: Neurology

## 2016-12-29 ENCOUNTER — Telehealth: Payer: Self-pay | Admitting: Medical

## 2016-12-29 NOTE — Telephone Encounter (Signed)
Will you call neurologist. I have been waiting for neurologist and neurosurgeon to see pt. Pt has seen neither. Mom just informed me that he was set up to see one of these. I think may neurologist but mom gives report that they would not see him due to outstanding bill. Will you call and investigate this. I need to know what are chance specialist will see him. I am in position that he has HA post surgery for brain tumor. He has not been able to work but he has not seen specialist. I think he had appointment on 12-07-2016 with neurologist but did not make appointment due outstanding bill.   Also will you schedule appointment with me this Friday between 1-2 pm if possible and give him 30 minute appointment.

## 2016-12-30 ENCOUNTER — Telehealth: Payer: Self-pay | Admitting: Medical

## 2016-12-30 NOTE — Telephone Encounter (Signed)
Would you ask pt if he knows how much he owes neurologist. Will he be able to pay. I want to know if he is aware of balance and what are chance he will see neurologist coming up. Ask him to call their office before he sees me this Friday.

## 2016-12-30 NOTE — Telephone Encounter (Addendum)
Yes patient owes money to [facility], he will need to pay before he can be seen. Patient NOS appt for neurology, that appt has been rescheduled for Oct. Patient is scheduled to come and see you this Friday @ 1:30, had to leave message on patients voicemail.

## 2016-12-30 NOTE — Telephone Encounter (Signed)
Called lm, awaiting return call

## 2017-01-01 ENCOUNTER — Ambulatory Visit: Payer: 59 | Admitting: Medical

## 2017-01-01 DIAGNOSIS — Z0289 Encounter for other administrative examinations: Secondary | ICD-10-CM

## 2017-02-01 ENCOUNTER — Ambulatory Visit (HOSPITAL_BASED_OUTPATIENT_CLINIC_OR_DEPARTMENT_OTHER)
Admission: RE | Admit: 2017-02-01 | Discharge: 2017-02-01 | Disposition: A | Discharge status: Home / Self Care | Payer: 59 | Source: Ambulatory Visit | Attending: Medical | Admitting: Medical

## 2017-02-01 ENCOUNTER — Encounter: Payer: Self-pay | Admitting: Medical

## 2017-02-01 ENCOUNTER — Ambulatory Visit (INDEPENDENT_AMBULATORY_CARE_PROVIDER_SITE_OTHER): Payer: 59 | Admitting: Medical

## 2017-02-01 VITALS — BP 123/73 | HR 85 | Temp 98.1°F | Ht 72.0 in | Wt 347.8 lb

## 2017-02-01 DIAGNOSIS — R05 Cough: Secondary | ICD-10-CM

## 2017-02-01 DIAGNOSIS — K219 Gastro-esophageal reflux disease without esophagitis: Secondary | ICD-10-CM

## 2017-02-01 DIAGNOSIS — R059 Cough, unspecified: Secondary | ICD-10-CM

## 2017-02-01 DIAGNOSIS — R0981 Nasal congestion: Secondary | ICD-10-CM

## 2017-02-01 MED ORDER — OMEPRAZOLE 20 MG PO CPDR: 20.0000 mg | DELAYED_RELEASE_CAPSULE | Freq: Every day | ORAL | 3 refills | Status: DC

## 2017-02-01 NOTE — Patient Instructions (Signed)
You have had cough for 2 months. Various potential reasons for cough but  in your case reflux/gerd most suspicous cause. Allergy rhinitis may be cause as well. Will advise healthy diet as discussed and omeprazole rx. Update me in 10 days if cough subsided. Might add different med if cough persists.  Please get cxr today.  Follow up in 10 days or as needed

## 2017-02-01 NOTE — Progress Notes (Signed)
Subjective:    Patient ID: Donald Mayer, male    DOB: 17-Jun-1985, 31 y.o.   MRN: [id number]  HPI   Pt in for a cough now for 2 months.   Pt states cough is off and on. He states he feels like needs to bring up some mucous but can't. He had nasal congestion before the cough. After nasal congestion the cough started.  He tried mucinex, claritin, advil sinus, humidifer and nothing has helped. Pt state cough seems worse at night. Pt denies any high pitch wheezing type sound. Mom gave him one dose of antibiotic but only for one day.  Pt does not smoke.  Pt does admit some heartburn he notes it does occur more at night when his cough is most predominant. Pt does state hx  Heart burn for  more than 6 months.   Pt update me that neurosurgeon has seen him. And neurologist appointment is on Mar 17, 2017(follow up from prior neurosurgery).   Othopedist appointment for rt knee surgery pending. They want him to see neurolgist before he can get surgery.   Review of Systems  Constitutional: Negative for chills, fatigue and fever.  HENT: Positive for congestion. Negative for ear discharge, ear pain, facial swelling, nosebleeds, postnasal drip and rhinorrhea.        Pt has some mild nasal congestion now.  Respiratory: Positive for cough. Negative for chest tightness, shortness of breath and wheezing.        Cough is more at night.  Cardiovascular: Negative for chest pain and palpitations.  Gastrointestinal: Negative for abdominal distention, abdominal pain, blood in stool, nausea and vomiting.       Reflux most of the time at night when he notices cough.  Genitourinary: Negative for dysuria, flank pain, frequency, genital sores, penile pain and testicular pain.  Musculoskeletal: Negative for back pain and neck pain.  Skin: Negative for rash.  Neurological: Negative for dizziness, syncope, speech difficulty, weakness and headaches.  Hematological: Negative for adenopathy. Does not  bruise/bleed easily.  Psychiatric/Behavioral: Negative for behavioral problems, confusion and sleep disturbance. The patient is not nervous/anxious.     Past Medical History:  Diagnosis Date  . Back injuries      Social History   Social History  . Marital status: Single    Spouse name: N/A  . Number of children: N/A  . Years of education: N/A   Occupational History  . Not on file.   Social History Main Topics  . Smoking status: Never Smoker  . Smokeless tobacco: Never Used  . Alcohol use Yes     Comment: occasional  . Drug use: No  . Sexual activity: Not on file   Other Topics Concern  . Not on file   Social History Narrative  . No narrative on file    Past Surgical History:  Procedure Laterality Date  . APPLICATION OF CRANIAL NAVIGATION N/A 05/04/2016   Procedure: APPLICATION OF CRANIAL NAVIGATION;  Surgeon: Consuella Lose, MD;  Location: [facility];  Service: Neurosurgery;  Laterality: N/A;  . ORIF WRIST FRACTURE Left 12/15/2012   Procedure: OPEN REDUCTION INTERNAL FIXATION (ORIF) WRIST FRACTURE; Left.  Reduction, repair, and reconnstruction of perilunate fracture dislocation;  Surgeon: Roseanne Kaufman, MD;  Location: [facility];  Service: Orthopedics;  Laterality: Left;  . STERIOTACTIC STIMULATOR INSERTION Right 05/04/2016   Procedure: STERIOTACTIC RIGHT FRONTAL CRANIOTOMY with BrainLab;  Surgeon: Consuella Lose, MD;  Location: [facility];  Service: Neurosurgery;  Laterality: Right;  right  Family History  Problem Relation Age of Onset  . Breast cancer Maternal Grandmother   . Lung cancer Maternal Grandfather   . Brain cancer Neg Hx     No Known Allergies  Current Outpatient Prescriptions on File Prior to Visit  Medication Sig Dispense Refill  . levETIRAcetam (KEPPRA) 500 MG tablet Take 1 tablet (500 mg total) by mouth 2 (two) times daily. 60 tablet 1  . butalbital-acetaminophen-caffeine (FIORICET WITH CODEINE) 50-325-40-30 MG capsule 1 tab po q 6 hours as needed ha  (Patient not taking: Reported on 02/01/2017) 12 capsule 0   No current facility-administered medications on file prior to visit.     BP 123/73 (BP Location: Left Arm, Patient Position: Sitting, Cuff Size: Large)   Pulse 85   Temp 98.1 F (36.7 C) (Oral)   Ht 6' (1.829 m)   Wt (!) 347 lb 12.8 oz (157.8 kg)   SpO2 98%   BMI 47.17 kg/m       Objective:   Physical Exam  General  Mental Status - Alert. General Appearance - Well groomed. Not in acute distress.  Skin Rashes- No Rashes.  HEENT Head- Normal. Ear Auditory Canal - Left- Normal. Right - Normal.Tympanic Membrane- Left- Normal. Right- Normal. Eye Sclera/Conjunctiva- Left- Normal. Right- Normal. Nose & Sinuses Nasal Mucosa- Left-  Boggy and Congested. Right-  Boggy and  Congested.Bilatera no  maxillary and no  frontal sinus pressure. Mouth & Throat Lips: Upper Lip- Normal: no dryness, cracking, pallor, cyanosis, or vesicular eruption. Lower Lip-Normal: no dryness, cracking, pallor, cyanosis or vesicular eruption. Buccal Mucosa- Bilateral- No Aphthous ulcers. Oropharynx- No Discharge or Erythema. Tonsils: Characteristics- Bilateral- No Erythema or Congestion. Size/Enlargement- Bilateral- No enlargement. Discharge- bilateral-None.  Neck Neck- Supple. No Masses.   Chest and Lung Exam Auscultation: Breath Sounds:-Clear even and unlabored.  Cardiovascular Auscultation:Rythm- Regular, rate and rhythm. Murmurs & Other Heart Sounds:Ausculatation of the heart reveal- No Murmurs.  Lymphatic Head & Neck General Head & Neck Lymphatics: Bilateral: Description- No Localized lymphadenopathy.  Abdomen- soft, non-tender, nondistended. +bs. No reboundor guarding.        Assessment & Plan:  You have had cough for 2 months. Various potential reasons for cough but  in your case reflux/gerd most suspicous cause. Allergy rhinitis may be cause as well. Will advise healthy diet as discussed and omeprazole rx. Update me in 10  days if cough subsided. Might add different med if cough persists.  Please get cxr today.  Follow up in 10 days or as needed  Taralee Marcus, Percell Miller, [employer]

## 2017-02-03 ENCOUNTER — Telehealth: Payer: Self-pay | Admitting: Medical

## 2017-02-03 NOTE — Telephone Encounter (Signed)
Pt called in to speak with assistant.   He didn't want to leave a detailed message. Pt would like a call back    CB: [phone]

## 2017-02-04 NOTE — Telephone Encounter (Signed)
Pt reports he has started to cough yellow mucus and the omeprazole was too much he could not get it.

## 2017-02-04 NOTE — Telephone Encounter (Signed)
If he thinks omeprazole was too strong then advise him to get Zantac over-the-counter and use it twice daily. With his productive cough let him know we can send in azithromycin to his pharmacy. If you would send that over. 250 mg tablets. #6 tabs. Disabled be 2 tablets by mouth daily 1 then 1 tablet by mouth 4 days.

## 2017-02-05 MED ORDER — AZITHROMYCIN 250 MG PO TABS: ORAL_TABLET | ORAL | 0 refills | Status: DC

## 2017-02-05 NOTE — Telephone Encounter (Signed)
Left pt a message making him aware I sent medication to pharmacy and he can get Zantac otc

## 2017-02-23 ENCOUNTER — Emergency Department (HOSPITAL_COMMUNITY)
Admission: EM | Admit: 2017-02-23 | Discharge: 2017-02-23 | Disposition: A | Discharge status: Home / Self Care | Payer: 59 | Attending: Emergency Medicine | Admitting: Emergency Medicine

## 2017-02-23 ENCOUNTER — Encounter (HOSPITAL_COMMUNITY): Payer: Self-pay | Admitting: Radiology

## 2017-02-23 ENCOUNTER — Emergency Department (HOSPITAL_COMMUNITY): Payer: 59

## 2017-02-23 DIAGNOSIS — R05 Cough: Secondary | ICD-10-CM

## 2017-02-23 DIAGNOSIS — R059 Cough, unspecified: Secondary | ICD-10-CM

## 2017-02-23 DIAGNOSIS — J020 Streptococcal pharyngitis: Secondary | ICD-10-CM | POA: Insufficient documentation

## 2017-02-23 DIAGNOSIS — G8929 Other chronic pain: Secondary | ICD-10-CM

## 2017-02-23 DIAGNOSIS — R51 Headache: Secondary | ICD-10-CM | POA: Insufficient documentation

## 2017-02-23 DIAGNOSIS — R042 Hemoptysis: Secondary | ICD-10-CM

## 2017-02-23 DIAGNOSIS — R509 Fever, unspecified: Secondary | ICD-10-CM

## 2017-02-23 LAB — URINALYSIS, ROUTINE W REFLEX MICROSCOPIC
Bilirubin Urine: NEGATIVE
GLUCOSE, UA: NEGATIVE mg/dL
Hgb urine dipstick: NEGATIVE
Ketones, ur: NEGATIVE mg/dL
LEUKOCYTES UA: NEGATIVE
Nitrite: NEGATIVE
PROTEIN: NEGATIVE mg/dL
Specific Gravity, Urine: 1.024 (ref 1.005–1.030)
pH: 8 (ref 5.0–8.0)

## 2017-02-23 LAB — CBC WITH DIFFERENTIAL/PLATELET
BASOS ABS: 0 10*3/uL (ref 0.0–0.1)
BASOS PCT: 0 %
EOS ABS: 0.1 10*3/uL (ref 0.0–0.7)
EOS PCT: 0 %
HCT: 39 % (ref 39.0–52.0)
Hemoglobin: 12.8 g/dL — ABNORMAL LOW (ref 13.0–17.0)
Lymphocytes Relative: 9 %
Lymphs Abs: 1.3 10*3/uL (ref 0.7–4.0)
MCH: 27.2 pg (ref 26.0–34.0)
MCHC: 32.8 g/dL (ref 30.0–36.0)
MCV: 82.8 fL (ref 78.0–100.0)
Monocytes Absolute: 0.6 10*3/uL (ref 0.1–1.0)
Monocytes Relative: 4 %
Neutro Abs: 11.7 10*3/uL — ABNORMAL HIGH (ref 1.7–7.7)
Neutrophils Relative %: 87 %
PLATELETS: 241 10*3/uL (ref 150–400)
RBC: 4.71 MIL/uL (ref 4.22–5.81)
RDW: 14.2 % (ref 11.5–15.5)
WBC: 13.5 10*3/uL — AB (ref 4.0–10.5)

## 2017-02-23 LAB — COMPREHENSIVE METABOLIC PANEL
ALBUMIN: 3.7 g/dL (ref 3.5–5.0)
ALT: 30 U/L (ref 17–63)
AST: 22 U/L (ref 15–41)
Alkaline Phosphatase: 103 U/L (ref 38–126)
Anion gap: 8 (ref 5–15)
BUN: 13 mg/dL (ref 6–20)
CHLORIDE: 106 mmol/L (ref 101–111)
CO2: 25 mmol/L (ref 22–32)
CREATININE: 1.43 mg/dL — AB (ref 0.61–1.24)
Calcium: 9 mg/dL (ref 8.9–10.3)
GFR calc non Af Amer: >60 mL/min (ref >60)
GLUCOSE: 96 mg/dL (ref 65–99)
Potassium: 4.1 mmol/L (ref 3.5–5.1)
SODIUM: 139 mmol/L (ref 135–145)
Total Bilirubin: 0.6 mg/dL (ref 0.3–1.2)
Total Protein: 7.6 g/dL (ref 6.5–8.1)

## 2017-02-23 LAB — D-DIMER, QUANTITATIVE (NOT AT ARMC)

## 2017-02-23 LAB — RAPID STREP SCREEN (MED CTR MEBANE ONLY): Streptococcus, Group A Screen (Direct): POSITIVE — AB

## 2017-02-23 LAB — I-STAT CG4 LACTIC ACID, ED: Lactic Acid, Venous: 1.82 mmol/L (ref 0.5–1.9)

## 2017-02-23 MED ORDER — DIPHENHYDRAMINE HCL 50 MG/ML IJ SOLN
25.0000 mg | Freq: Once | INTRAMUSCULAR | Status: AC
  Administered 2017-02-23: 25 mg via INTRAVENOUS
  Filled 2017-02-23: qty 1

## 2017-02-23 MED ORDER — DEXAMETHASONE SODIUM PHOSPHATE 10 MG/ML IJ SOLN
10.0000 mg | Freq: Once | INTRAMUSCULAR | Status: AC
  Administered 2017-02-23: 10 mg via INTRAVENOUS
  Filled 2017-02-23: qty 1

## 2017-02-23 MED ORDER — IBUPROFEN 200 MG PO TABS
400.0000 mg | ORAL_TABLET | Freq: Once | ORAL | Status: AC
  Administered 2017-02-23: 400 mg via ORAL
  Filled 2017-02-23: qty 2

## 2017-02-23 MED ORDER — PROCHLORPERAZINE EDISYLATE 5 MG/ML IJ SOLN
10.0000 mg | Freq: Once | INTRAMUSCULAR | Status: AC
Start: 2017-02-23 — End: 2017-02-23
  Administered 2017-02-23: 10 mg via INTRAVENOUS
  Filled 2017-02-23: qty 2

## 2017-02-23 MED ORDER — SODIUM CHLORIDE 0.9 % IV BOLUS (SEPSIS)
1000.0000 mL | Freq: Once | INTRAVENOUS | Status: AC
  Administered 2017-02-23: 1000 mL via INTRAVENOUS

## 2017-02-23 MED ORDER — ACETAMINOPHEN 500 MG PO TABS
1000.0000 mg | ORAL_TABLET | Freq: Once | ORAL | Status: AC
  Administered 2017-02-23: 1000 mg via ORAL
  Filled 2017-02-23: qty 2

## 2017-02-23 MED ORDER — MAGNESIUM SULFATE 2 GM/50ML IV SOLN
2.0000 g | Freq: Once | INTRAVENOUS | Status: AC
  Administered 2017-02-23: 2 g via INTRAVENOUS
  Filled 2017-02-23: qty 50

## 2017-02-23 MED ORDER — PENICILLIN G BENZATHINE 1200000 UNIT/2ML IM SUSP
1.2000 10*6.[IU] | Freq: Once | INTRAMUSCULAR | Status: AC
  Administered 2017-02-23: 1.2 10*6.[IU] via INTRAMUSCULAR
  Filled 2017-02-23: qty 2

## 2017-02-23 NOTE — ED Provider Notes (Signed)
[place] DEPT Provider Note   CSN: [id number] Arrival date & time: 02/23/17  9937     History   Chief Complaint Chief Complaint  Patient presents with  . Cough    HPI Donald Mayer is a 31 y.o. male.  HPI   Cough for a few months, went to Dr 8/20, however cough has been getting worse, yellow sputum, bright red blood, coughed up all day yesterday, had felt ok yesterday, felt shortness of breath starting last night, started suddenly, no chest pain, no leg swelling. Does report diffuse body aches.  Fever started last night  Headache and visual problems ongoing since November, has a headache similar to prior, worse with bright lights and loud sounds, headaches worse since tumor removal in November, no numbness/weakness on one side or the other, no speech problems. Reports neck pain with headache too but reports headaches always cause pain everywhere including neck.   Past Medical History:  Diagnosis Date  . Back injuries     Patient Active Problem List   Diagnosis Date Noted  . Palpitations 11/03/2016  . Morbid obesity ([place]) 11/03/2016  . Brain mass 04/30/2016  . Generalized weakness 04/30/2016  . Nonintractable headache     Past Surgical History:  Procedure Laterality Date  . APPLICATION OF CRANIAL NAVIGATION N/A 05/04/2016   Procedure: APPLICATION OF CRANIAL NAVIGATION;  Surgeon: Consuella Lose, MD;  Location: [place];  Service: Neurosurgery;  Laterality: N/A;  . ORIF WRIST FRACTURE Left 12/15/2012   Procedure: OPEN REDUCTION INTERNAL FIXATION (ORIF) WRIST FRACTURE; Left.  Reduction, repair, and reconnstruction of perilunate fracture dislocation;  Surgeon: Roseanne Kaufman, MD;  Location: [place];  Service: Orthopedics;  Laterality: Left;  . STERIOTACTIC STIMULATOR INSERTION Right 05/04/2016   Procedure: STERIOTACTIC RIGHT FRONTAL CRANIOTOMY with BrainLab;  Surgeon: Consuella Lose, MD;  Location: [place];  Service: Neurosurgery;  Laterality: Right;  right        Home Medications    Prior to Admission medications   Medication Sig Start Date End Date Taking? Authorizing Provider  azithromycin (ZITHROMAX) 250 MG tablet 2 tablets by mouth today then 1 tablet by mouth for 4 days Patient not taking: Reported on 02/23/2017 02/05/17   Saguier, Percell Miller, PA-C  butalbital-acetaminophen-caffeine (FIORICET WITH CODEINE) [phone] MG capsule 1 tab po q 6 hours as needed ha Patient not taking: Reported on 02/01/2017 09/16/16   Saguier, Percell Miller, PA-C  levETIRAcetam (KEPPRA) 500 MG tablet Take 1 tablet (500 mg total) by mouth 2 (two) times daily. Patient not taking: Reported on 02/23/2017 05/10/16   Newman Pies, MD  omeprazole (PRILOSEC) 20 MG capsule Take 1 capsule (20 mg total) by mouth daily. Patient not taking: Reported on 02/23/2017 02/01/17   Saguier, Percell Miller, PA-C    Family History Family History  Problem Relation Age of Onset  . Breast cancer Maternal Grandmother   . Lung cancer Maternal Grandfather   . Brain cancer Neg Hx     Social History Social History  Substance Use Topics  . Smoking status: Never Smoker  . Smokeless tobacco: Never Used  . Alcohol use Yes     Comment: occasional     Allergies   Patient has no known allergies.   Review of Systems Review of Systems  Constitutional: Positive for appetite change, fatigue and fever.  HENT: Positive for congestion and sore throat.   Eyes: Negative for visual disturbance.  Respiratory: Positive for cough and shortness of breath.   Cardiovascular: Negative for chest pain and leg swelling.  Gastrointestinal: Negative  for abdominal pain, nausea and vomiting.  Genitourinary: Negative for difficulty urinating.  Musculoskeletal: Positive for neck pain. Negative for back pain.  Skin: Negative for rash.  Neurological: Positive for headaches. Negative for dizziness, syncope, weakness and numbness.     Physical Exam Updated Vital Signs BP (!) 140/56   Pulse 90   Temp 98.9 F (37.2  C) (Oral)   Resp 20   SpO2 100%   Physical Exam  Constitutional: He is oriented to person, place, and time. He appears well-developed and well-nourished. No distress.  HENT:  Head: Normocephalic and atraumatic.  Eyes: Conjunctivae and EOM are normal.  Neck: Normal range of motion.  Cardiovascular: Normal rate, regular rhythm, normal heart sounds and intact distal pulses.  Exam reveals no gallop and no friction rub.   No murmur heard. Pulmonary/Chest: Effort normal and breath sounds normal. No respiratory distress. He has no wheezes. He has no rales.  Abdominal: Soft. He exhibits no distension. There is no tenderness. There is no guarding.  Musculoskeletal: He exhibits no edema.  Neurological: He is alert and oriented to person, place, and time. He has normal strength. No cranial nerve deficit or sensory deficit. GCS eye subscore is 4. GCS verbal subscore is 5. GCS motor subscore is 6.  Skin: Skin is warm and dry. He is not diaphoretic.  Nursing note and vitals reviewed.    ED Treatments / Results  Labs (all labs ordered are listed, but only abnormal results are displayed) Labs Reviewed  RAPID STREP SCREEN (NOT AT [facility]) - Abnormal; Notable for the following:       Result Value   Streptococcus, Group A Screen (Direct) POSITIVE (*)    All other components within normal limits  CBC WITH DIFFERENTIAL/PLATELET - Abnormal; Notable for the following:    WBC 13.5 (*)    Hemoglobin 12.8 (*)    Neutro Abs 11.7 (*)    All other components within normal limits  COMPREHENSIVE METABOLIC PANEL - Abnormal; Notable for the following:    Creatinine, Ser 1.43 (*)    All other components within normal limits  D-DIMER, QUANTITATIVE (NOT AT [facility])  URINALYSIS, ROUTINE W REFLEX MICROSCOPIC  I-STAT CG4 LACTIC ACID, ED    EKG  EKG Interpretation  Date/Time:  Tuesday February 23 2017 08:20:39 EDT Ventricular Rate:  95 PR Interval:    QRS Duration: 79 QT Interval:  339 QTC Calculation: 427 R  Axis:   67 Text Interpretation:  Sinus rhythm No significant change since last tracing Confirmed by Gareth Morgan [id number]) on 02/23/2017 11:25:42 AM       Radiology Dg Chest 2 View  Result Date: 02/23/2017 CLINICAL DATA:  Cough, chest congestion, and shortness of breath for the past 4 days. History of brain tumor surgery in November 2017. The patient is also complaining of severe headache. EXAM: CHEST  2 VIEW COMPARISON:  Chest x-ray of February 01, 2017 FINDINGS: The lungs are adequately inflated. There is no focal infiltrate. There is no pleural effusion. The heart and pulmonary vascularity are normal. The mediastinum is normal in width. The bony thorax is unremarkable. IMPRESSION: There is no pneumonia nor other acute cardiopulmonary abnormality. Electronically Signed   By: David  Martinique M.D.   On: 02/23/2017 08:17   Ct Head Wo Contrast  Result Date: 02/23/2017 CLINICAL DATA:  Benign brain tumor resection 04/2016.  Headaches. EXAM: CT HEAD WITHOUT CONTRAST TECHNIQUE: Contiguous axial images were obtained from the base of the skull through the vertex without intravenous contrast. COMPARISON:  MRI 09/17/2016.  CT 07/14/2016. FINDINGS: Brain: Prior right frontotemporal craniotomy. Area of right frontal resection bed/ encephalomalacia is stable. No hemorrhage or hydrocephalus. No mass effect or midline shift. Vascular: No hyperdense vessel or unexpected calcification. Skull: No acute calvarial abnormality. Sinuses/Orbits: Visualized paranasal sinuses and mastoids clear. Orbital soft tissues unremarkable. Other: None IMPRESSION: Stable appearance of the right frontal resection head. No acute intracranial abnormality. Electronically Signed   By: Rolm Baptise M.D.   On: 02/23/2017 10:23    Procedures Procedures (including critical care time)  Medications Ordered in ED Medications  sodium chloride 0.9 % bolus 1,000 mL (0 mLs Intravenous Stopped 02/23/17 1035)  acetaminophen (TYLENOL) tablet 1,000 mg  (1,000 mg Oral Given 02/23/17 0824)  prochlorperazine (COMPAZINE) injection 10 mg (10 mg Intravenous Given 02/23/17 0935)  diphenhydrAMINE (BENADRYL) injection 25 mg (25 mg Intravenous Given 02/23/17 0936)  ibuprofen (ADVIL,MOTRIN) tablet 400 mg (400 mg Oral Given 02/23/17 1109)  sodium chloride 0.9 % bolus 1,000 mL (0 mLs Intravenous Stopped 02/23/17 1246)  magnesium sulfate IVPB 2 g 50 mL (0 g Intravenous Stopped 02/23/17 1246)  dexamethasone (DECADRON) injection 10 mg (10 mg Intravenous Given 02/23/17 1409)  penicillin g benzathine (BICILLIN LA) 1200000 UNIT/2ML injection 1.2 Million Units (1.2 Million Units Intramuscular Given 02/23/17 1405)     Initial Impression / Assessment and Plan / ED Course  I have reviewed the triage vital signs and the nursing notes.  Pertinent labs & imaging results that were available during my care of the patient were reviewed by me and considered in my medical decision making (see chart for details).     31 year old male with a history of frontal craniotomy for brain tumor presents with concern for cough, congestion, fever, headache, sore throat, hemoptysis.  Chest x-ray done dizziness and a pneumonia. Patient does not have any tuberculosis risk factors, and in setting of negative chest x-ray have low suspicion for this. D-dimer is negative, low suspicion for pulmonary embolus. Patient with history of prior craniotomy, and reports chronic headaches since that time, including chronic daily headaches. CT done today shows no acute abnormalities. Given chronicity of headache, normal ROM of neck, no meningeal signs, normal mental status, have low suspicion for meningitis.  Mild leukocytosis of 13.6. Normal lactic acid and other labs within normal limits. Given normal saline. Vital signs improved with improvement of fever. Strep screen was positive. In settings were throat, headache, feel this is most likely etiology of patient's symptoms. Hemoptysis may be secondary to other  deeper throat lesion not visualized or possibly bronchitis.  Recommend continued pcp follow up. Given penicllin IM and decadron.  Patient discharged in stable condition with understanding of reasons to return.     Final Clinical Impressions(s) / ED Diagnoses   Final diagnoses:  Cough  Strep throat  Fever, unspecified fever cause  Hemoptysis  Chronic nonintractable headache, unspecified headache type    New Prescriptions Discharge Medication List as of 02/23/2017  2:52 PM       Gareth Morgan, MD 02/23/17 1909

## 2017-02-23 NOTE — ED Notes (Signed)
This Probation officer attempted US guided IV x2 unsuccessful; with both attempts pt requests writer to take IV catheter out prior to successful insertion of catheter. Billy Fischer, MD aware and verbalizes will attempt US guided IV.

## 2017-02-23 NOTE — ED Triage Notes (Signed)
Pt from with c/o SOB with hyperventilation cough with congestion. Pt states "I can't breath". Answers questions selectively and is a difficult interview. VS BP= 157/90 Hr= 100 to 110 resp= 30's and O2 sat 100% RA CBG= 119

## 2017-03-02 ENCOUNTER — Telehealth: Payer: Self-pay | Admitting: *Deleted

## 2017-03-02 NOTE — Telephone Encounter (Signed)
Received request for Medical records from [place] [employer], forwarded to [place] for email/scan/SLS 09/18    

## 2017-03-08 ENCOUNTER — Encounter: Payer: Self-pay | Admitting: Medical

## 2017-03-08 ENCOUNTER — Ambulatory Visit (INDEPENDENT_AMBULATORY_CARE_PROVIDER_SITE_OTHER): Payer: Self-pay | Admitting: Medical

## 2017-03-08 VITALS — BP 143/81 | HR 95 | Temp 98.2°F | Resp 16 | Ht 74.0 in | Wt 375.4 lb

## 2017-03-08 DIAGNOSIS — J029 Acute pharyngitis, unspecified: Secondary | ICD-10-CM

## 2017-03-08 DIAGNOSIS — J020 Streptococcal pharyngitis: Secondary | ICD-10-CM

## 2017-03-08 LAB — POCT RAPID STREP A (OFFICE): Rapid Strep A Screen: POSITIVE — AB

## 2017-03-08 MED ORDER — AMOXICILLIN 875 MG PO TABS: 875.0000 mg | ORAL_TABLET | Freq: Two times a day (BID) | ORAL | 0 refills | Status: DC

## 2017-03-08 MED FILL — AMOXICILLIN 875 MG TABLET: 875 | 10 days supply | Qty: 20 | Fill #0

## 2017-03-08 NOTE — Progress Notes (Signed)
Subjective:    Patient ID: Donald Mayer, male    DOB: 07-Oct-1985, 31 y.o.   MRN: [id number]  HPI  Pt in for recent sore throat that started 2 days ago. Pain on 02-23-2017 he was diagnosed after going to ED for st and associated signs/symptoms of infection. He was given bicillin injection and he felt completely better after 4-5 days. Then 2 days ago woke and felt sore throat. Pt denies any close exposure to children or known persons with strep.  He lives by himself.  Pt denies any fever, no chills, no sweats, no ha and no body aches. Has only st. His strep test came back positive for strep today.    Review of Systems  Constitutional: Negative for chills, fatigue and fever.  HENT: Positive for sore throat. Negative for congestion, drooling, ear discharge, ear pain, facial swelling, postnasal drip, rhinorrhea, sinus pain and sinus pressure.   Respiratory: Negative for chest tightness, shortness of breath and stridor.   Cardiovascular: Negative for chest pain and palpitations.  Gastrointestinal: Negative for abdominal pain and blood in stool.  Musculoskeletal: Negative for back pain, myalgias, neck pain and neck stiffness.  Skin: Negative for rash.  Neurological: Negative for dizziness, syncope, speech difficulty, weakness and headaches.  Hematological: Negative for adenopathy. Does not bruise/bleed easily.  Psychiatric/Behavioral: Negative for behavioral problems, confusion, dysphoric mood, self-injury and sleep disturbance. The patient is not nervous/anxious.     Past Medical History:  Diagnosis Date  . Back injuries      Social History   Social History  . Marital status: Single    Spouse name: N/A  . Number of children: N/A  . Years of education: N/A   Occupational History  . Not on file.   Social History Main Topics  . Smoking status: Never Smoker  . Smokeless tobacco: Never Used  . Alcohol use Yes     Comment: occasional  . Drug use: No  . Sexual activity:  Not on file   Other Topics Concern  . Not on file   Social History Narrative  . No narrative on file    Past Surgical History:  Procedure Laterality Date  . APPLICATION OF CRANIAL NAVIGATION N/A 05/04/2016   Procedure: APPLICATION OF CRANIAL NAVIGATION;  Surgeon: Consuella Lose, MD;  Location: [place];  Service: Neurosurgery;  Laterality: N/A;  . ORIF WRIST FRACTURE Left 12/15/2012   Procedure: OPEN REDUCTION INTERNAL FIXATION (ORIF) WRIST FRACTURE; Left.  Reduction, repair, and reconnstruction of perilunate fracture dislocation;  Surgeon: Roseanne Kaufman, MD;  Location: [place];  Service: Orthopedics;  Laterality: Left;  . STERIOTACTIC STIMULATOR INSERTION Right 05/04/2016   Procedure: STERIOTACTIC RIGHT FRONTAL CRANIOTOMY with BrainLab;  Surgeon: Consuella Lose, MD;  Location: [place];  Service: Neurosurgery;  Laterality: Right;  right    Family History  Problem Relation Age of Onset  . Breast cancer Maternal Grandmother   . Lung cancer Maternal Grandfather   . Brain cancer Neg Hx     No Known Allergies  Current Outpatient Prescriptions on File Prior to Visit  Medication Sig Dispense Refill  . butalbital-acetaminophen-caffeine (FIORICET WITH CODEINE) 50-325-40-30 MG capsule 1 tab po q 6 hours as needed ha 12 capsule 0  . levETIRAcetam (KEPPRA) 500 MG tablet Take 1 tablet (500 mg total) by mouth 2 (two) times daily. 60 tablet 1  . omeprazole (PRILOSEC) 20 MG capsule Take 1 capsule (20 mg total) by mouth daily. 30 capsule 3   No current facility-administered medications on file  prior to visit.     BP (!) 143/81   Pulse 95   Temp 98.2 F (36.8 C) (Oral)   Resp 16   Ht 6\' 2"  (1.88 m)   Wt (!) 375 lb 6.4 oz (170.3 kg)   SpO2 99%   BMI 48.20 kg/m       Objective:   Physical Exam  General  Mental Status - Alert. General Appearance - Well groomed. Not in acute distress.  Skin Rashes- No Rashes.  HEENT Head- Normal. Ear Auditory Canal - Left- Normal. Right -  Normal.Tympanic Membrane- Left- Normal. Right- Normal. Eye Sclera/Conjunctiva- Left- Normal. Right- Normal. Nose & Sinuses Nasal Mucosa- Left- Not  Boggy and Congested. Right- Not Boggy and  Congested.Bilateral no maxillary and no  frontal sinus pressure. Mouth & Throat Lips: Upper Lip- Normal: no dryness, cracking, pallor, cyanosis, or vesicular eruption. Lower Lip-Normal: no dryness, cracking, pallor, cyanosis or vesicular eruption. Buccal Mucosa- Bilateral- No Aphthous ulcers. Oropharynx- No Discharge or Erythema. Tonsils: Characteristics- Bilateral- Moderate  Erythema or Congestion. Size/Enlargement- Bilateral- 1+ enlargement. Discharge- bilateral-None.  Neck Neck- Supple. No Masses.   Chest and Lung Exam Auscultation: Breath Sounds:-Clear even and unlabored.  Cardiovascular Auscultation:Rythm- Regular, rate and rhythm. Murmurs & Other Heart Sounds:Ausculatation of the heart reveal- No Murmurs.  Lymphatic Head & Neck General Head & Neck Lymphatics: Bilateral: Description- No Localized lymphadenopathy.       Assessment & Plan:  Your strep test was positive. I am prescribing amoxicillin  antibiotic. Rest hydrate, tylenol for fever and warm salt water gargles.   After antibiotic if you have any residual st at all please let us know and would do repeat rapid strep test and send out culture since recent quick reinfection post treatment.  Follow up in 7 days or as needed.  Meiko Ives, Percell Miller, PA-C

## 2017-03-08 NOTE — Patient Instructions (Addendum)
Your strep test was positive. I am prescribing amoxicillin  antibiotic. Rest hydrate, tylenol for fever and warm salt water gargles.   After antibiotic if you have any residual st at all please let us know and would do repeat rapid strep test and send out culture since recent quick reinfection post treatment.  Follow up in 7 days or as needed.

## 2017-03-17 ENCOUNTER — Ambulatory Visit (INDEPENDENT_AMBULATORY_CARE_PROVIDER_SITE_OTHER): Payer: Self-pay | Admitting: Neurology

## 2017-03-17 ENCOUNTER — Encounter: Payer: Self-pay | Admitting: Neurology

## 2017-03-17 VITALS — BP 118/84 | HR 88 | Ht 74.0 in | Wt 380.0 lb

## 2017-03-17 DIAGNOSIS — D332 Benign neoplasm of brain, unspecified: Secondary | ICD-10-CM

## 2017-03-17 DIAGNOSIS — G43709 Chronic migraine without aura, not intractable, without status migrainosus: Secondary | ICD-10-CM

## 2017-03-17 MED ORDER — SUMATRIPTAN SUCCINATE 100 MG PO TABS: ORAL_TABLET | ORAL | 2 refills | Status: DC

## 2017-03-17 MED ORDER — TOPIRAMATE 50 MG PO TABS: 50.0000 mg | ORAL_TABLET | Freq: Every day | ORAL | 2 refills | Status: DC

## 2017-03-17 NOTE — Progress Notes (Signed)
NEUROLOGY CONSULTATION NOTE  FRASER BUSCHE MRN: [id number] DOB: February 19, 1986  Referring provider: Mackie Pai, PA-C Primary care provider: Mackie Pai, PA-C  Reason for consult:  headache  HISTORY OF PRESENT ILLNESS: Donald Mayer is a 31 year old male who presents for headaches.  History supplemented by hospital notes and PCP notes.  Head CT and MRIs were personally reviewed.  On 04/28/16, he had 2 episodes of syncope.  Afterwards, he developed a mild headache.  CT of head from 04/30/16 revealed area of low attenuation in the right frontotemporal region.  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  He underwent a frontal craniotomy on 05/04/16 for resection of the tumor.  Postoperative MRI revealed postsurgical changes but no residual tumor.  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  He is on Keppra as a prophylactic but has not had a seizure.  Migraines started around this time.  They are right frontal, throbbing, 9-10/10 intensity and associated with nausea, photophobia, and phonophobia.  There is no preceding aura.  They last until he goes to sleep and occurs daily.  They are triggered or aggravated by light and sound and are relieved by sleep.  He has tried ibuprofen (takes daily) and Fioricet.  He is often unable to function.  He had another MRI of brain without contrast on 09/17/16, which revealed low level T2 and FLAIR signal around the region of resection which could represent sequelae of previous surgery, but could not exclude recurrent tumor.  He followed up with neurosurgery and is monitoring for now.  He is scheduled to repeat imaging next month and follow up with neurosurgery afterward.  02/23/17 CMP:  Na 139, K 4.1, Cl 106, CO2 25, glucose 96, BUN 13, Cr 1.43, GFR over 60, total bili 0.6, ALP 103, AST 22, ALT 30.  PAST MEDICAL  HISTORY: Past Medical History:  Diagnosis Date  . Back injuries     PAST SURGICAL HISTORY: Past Surgical History:  Procedure Laterality Date  . APPLICATION OF CRANIAL NAVIGATION N/A 05/04/2016   Procedure: APPLICATION OF CRANIAL NAVIGATION;  Surgeon: Consuella Lose, MD;  Location: [place];  Service: Neurosurgery;  Laterality: N/A;  . ORIF WRIST FRACTURE Left 12/15/2012   Procedure: OPEN REDUCTION INTERNAL FIXATION (ORIF) WRIST FRACTURE; Left.  Reduction, repair, and reconnstruction of perilunate fracture dislocation;  Surgeon: Roseanne Kaufman, MD;  Location: [place];  Service: Orthopedics;  Laterality: Left;  . STERIOTACTIC STIMULATOR INSERTION Right 05/04/2016   Procedure: STERIOTACTIC RIGHT FRONTAL CRANIOTOMY with BrainLab;  Surgeon: Consuella Lose, MD;  Location: [place];  Service: Neurosurgery;  Laterality: Right;  right    MEDICATIONS: Current Outpatient Prescriptions on File Prior to Visit  Medication Sig Dispense Refill  . amoxicillin (AMOXIL) 875 MG tablet Take 1 tablet (875 mg total) by mouth 2 (two) times daily. 20 tablet 0  . butalbital-acetaminophen-caffeine (FIORICET WITH CODEINE) 50-325-40-30 MG capsule 1 tab po q 6 hours as needed ha 12 capsule 0  . levETIRAcetam (KEPPRA) 500 MG tablet Take 1 tablet (500 mg total) by mouth 2 (two) times daily. 60 tablet 1  . omeprazole (PRILOSEC) 20 MG capsule Take 1 capsule (20 mg total) by mouth daily. (Patient not taking: Reported on 03/17/2017) 30 capsule 3   No current facility-administered medications on file prior to visit.     ALLERGIES: No Known Allergies  FAMILY HISTORY: Family  History  Problem Relation Age of Onset  . Breast cancer Maternal Grandmother   . Lung cancer Maternal Grandfather   . Brain cancer Neg Hx     SOCIAL HISTORY: Social History   Social History  . Marital status: Single    Spouse name: N/A  . Number of children: N/A  . Years of education: some colleg   Occupational History  . disabled     Social History Main Topics  . Smoking status: Never Smoker  . Smokeless tobacco: Never Used  . Alcohol use Yes     Comment: occasional  . Drug use: No  . Sexual activity: Not on file   Other Topics Concern  . Not on file   Social History Narrative   Lives with mother, in 2 story home, no pets. 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    REVIEW OF SYSTEMS: Constitutional: No fevers, chills, or sweats, no generalized fatigue, change in appetite Eyes: No visual changes, double vision, eye pain Ear, nose and throat: No hearing loss, ear pain, nasal congestion, sore throat Cardiovascular: No chest pain, palpitations Respiratory:  No shortness of breath at rest or with exertion, wheezes GastrointestinaI: No nausea, vomiting, diarrhea, abdominal pain, fecal incontinence Genitourinary:  No dysuria, urinary retention or frequency Musculoskeletal:  No neck pain, back pain Integumentary: No rash, pruritus, skin lesions Neurological: as above Psychiatric: No depression, insomnia, anxiety Endocrine: No palpitations, fatigue, diaphoresis, mood swings, change in appetite, change in weight, increased thirst Hematologic/Lymphatic:  No purpura, petechiae. Allergic/Immunologic: no itchy/runny eyes, nasal congestion, recent allergic reactions, rashes  PHYSICAL EXAM: Vitals:   03/17/17 1003  BP: 118/84  Pulse: 88  SpO2: 97%   General: No acute distress.  Patient appears well-groomed. Morbidly obese Head:  Normocephalic/atraumatic Eyes:  fundi examined but not visualized Neck: supple, no paraspinal tenderness, full range of motion Back: No paraspinal tenderness Heart: regular rate and rhythm Lungs: Clear to auscultation bilaterally. Vascular: No carotid bruits. Neurological Exam: Mental status: alert and oriented to person, place, and time, recent and remote memory intact, fund of knowledge intact, attention and concentration intact, speech fluent and not dysarthric, language  intact. Cranial nerves: CN I: not tested CN II: pupils equal, round and reactive to light, visual fields intact CN III, IV, VI:  full range of motion, no nystagmus, no ptosis CN V: facial sensation intact CN VII: upper and lower face symmetric CN VIII: hearing intact CN IX, X: gag intact, uvula midline CN XI: sternocleidomastoid and trapezius muscles intact CN XII: tongue midline Bulk & Tone: normal, no fasciculations. Motor:  5/5 throughout  Sensation: temperature and vibration sensation intact. Deep Tendon Reflexes:  2+ throughout, toes downgoing.  Finger to nose testing:  Without dysmetria.  Heel to shin:  Without dysmetria.  Gait:  Normal station and stride.  Able to turn and tandem walk. Romberg negative.  IMPRESSION: Chronic migraine, complicated by medication overuse DNET  PLAN: 1.  Start topiramate 50mg  at bedtime.  Side effects discussed.  Advised to contact us in 4 weeks if headaches not improved. 2.  Sumatriptan 100mg  for abortive therapy 3.  Stop ibuprofen and Fioricet 4.  Lifestyle modification: weight loss, exercise, increase water intake, sleep hygiene 5.  Follow up with neurosurgery 6.  Follow up with me in 3 months.  Thank you for allowing me to take part in the care of this patient.  Metta Clines, DO  CC:  Mackie Pai, PA-C

## 2017-03-17 NOTE — Patient Instructions (Signed)
Migraine Recommendations: 1.  Start topiramate 50mg  at bedtime.  Call in 4 weeks with update and we can adjust dose if needed. 2.  Take sumatriptan 100mg  at earliest onset of headache.  May repeat dose once in 2 hours if needed.  Do not exceed two tablets in 24 hours. 3.  Stop Fioricet and ibuprofen.  Limit use of pain relievers to no more than 2 days out of the week.  These medications include acetaminophen, ibuprofen, triptans and narcotics.  This will help reduce risk of rebound headaches. 4.  Be aware of common food triggers such as processed sweets, processed foods with nitrites (such as deli meat, hot dogs, sausages), foods with MSG, alcohol (such as wine), chocolate, certain cheeses, certain fruits (dried fruits, some citrus fruit), vinegar, diet soda. 4.  Avoid caffeine 5.  Routine exercise, weight loss 6.  Proper sleep hygiene 7.  Stay adequately hydrated with water 8.  Keep a headache diary. 9.  Maintain proper stress management. 10.  Do not skip meals. 11.  Consider supplements:  Magnesium citrate 400mg  to 600mg  daily, riboflavin 400mg , Coenzyme Q 10 100mg  three times daily 12.  Follow up in 3 months.

## 2017-06-28 ENCOUNTER — Ambulatory Visit (INDEPENDENT_AMBULATORY_CARE_PROVIDER_SITE_OTHER): Payer: Self-pay | Admitting: Neurology

## 2017-06-28 ENCOUNTER — Encounter: Payer: Self-pay | Admitting: Neurology

## 2017-06-28 VITALS — BP 128/84 | HR 98 | Ht 74.0 in | Wt 377.4 lb

## 2017-06-28 DIAGNOSIS — G43709 Chronic migraine without aura, not intractable, without status migrainosus: Secondary | ICD-10-CM

## 2017-06-28 DIAGNOSIS — D332 Benign neoplasm of brain, unspecified: Secondary | ICD-10-CM

## 2017-06-28 DIAGNOSIS — R55 Syncope and collapse: Secondary | ICD-10-CM

## 2017-06-28 MED ORDER — TOPIRAMATE 50 MG PO TABS: ORAL_TABLET | ORAL | 0 refills | Status: DC

## 2017-06-28 NOTE — Patient Instructions (Signed)
1.  We will increase topiramate to 50mg  twice daily for 1 week, then 100mg  twice daily 2.  Continue on levetiracetam (Keppra) 500mg  twice daily.  When you follow up, plan would be to stop it if you are doing well on Keppra 3.  We will order MRI of brain with and without contrast 4.  I would like to try and get a 24 hour ambulatory EEG if possible.  I would recommend getting on the [facility] to help with costs. 5.  Follow up in 6 to 8 weeks.

## 2017-06-28 NOTE — Progress Notes (Signed)
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   UPDATE: Headaches are unchanged. Intensity:  Moderate to severe Duration:  Until he falls asleep Frequency:  Twice daily (morning and evening) Current abortive medication:  He is not taking any pain reliever.  He is unable to afford sumatriptan 100mg  as h e is without insurance. Current preventative medication:  topiramate 50mg , Keppra 500mg  twice daily.  About a month ago, he started having black-out spells again.  Sometimes, it occurs when he is standing on his feet, such as in the shower or walking down the stairs.  He feels lightheaded and then passes out.  However, he also reports episodes while just sitting or reclining on the couch or bed.  He has no preceding warning.  He wakes up and notes a loss of time.  Family members have not reported any noticeable seizure-like activity if he is sleeping or unconscious on the couch.  It occurs once in a while.   HISTORY: On 04/28/16, he had 2 episodes of syncope.  Afterwards, he developed a mild headache.  CT of head from 04/30/16 revealed area of low attenuation in the right frontotemporal region.  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  He underwent a frontal craniotomy on 05/04/16 for resection of the tumor.  Postoperative MRI revealed postsurgical changes but no residual tumor.  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  EEG at that time was normal.  He is on Keppra as a prophylactic but has not had a seizure.   Migraines started around this time.  They are right frontal, throbbing, 9-10/10 intensity and associated with nausea, photophobia, and phonophobia.  There is no preceding aura.  They last until he goes to sleep and occurs  daily.  They are triggered or aggravated by light and sound and are relieved by sleep.  He has tried ibuprofen (takes daily) and Fioricet.  He is often unable to function.   He had another MRI of brain without contrast on 09/17/16, which revealed low level T2 and FLAIR signal around the region of resection which could represent sequelae of previous surgery, but could not exclude recurrent tumor.  He followed up with neurosurgery and is monitoring for now.  He is scheduled to repeat imaging next month and follow up with neurosurgery afterward.  To assess syncope, he had a 24 hour holter monitor in June 2018, which was unremarkable.  PAST MEDICAL HISTORY: Past Medical History:  Diagnosis Date  . Back injuries     MEDICATIONS: Current Outpatient Medications on File Prior to Visit  Medication Sig Dispense Refill  . amoxicillin (AMOXIL) 875 MG tablet Take 1 tablet (875 mg total) by mouth 2 (two) times daily. (Patient not taking: Reported on 06/28/2017) 20 tablet 0  . butalbital-acetaminophen-caffeine (FIORICET WITH CODEINE) 50-325-40-30 MG capsule 1 tab po q 6 hours as needed ha (Patient not taking: Reported on 06/28/2017) 12 capsule 0  . ibuprofen (ADVIL,MOTRIN) 200 MG tablet Take 200 mg by mouth every 6 (six) hours as needed.    . levETIRAcetam (KEPPRA) 500 MG tablet Take 1 tablet (500 mg total) by mouth 2 (two) times daily. 60 tablet 1  . omeprazole (PRILOSEC) 20 MG capsule Take 1 capsule (20  mg total) by mouth daily. (Patient not taking: Reported on 03/17/2017) 30 capsule 3  . SUMAtriptan (IMITREX) 100 MG tablet Take 1 tablet earliest onset of headache.  May repeat x1 in 2 hours if headache persists or recurs. (Patient not taking: Reported on 06/28/2017) 10 tablet 2   No current facility-administered medications on file prior to visit.     ALLERGIES: No Known Allergies  FAMILY HISTORY: Family History  Problem Relation Age of Onset  . Breast cancer Maternal Grandmother   . Lung cancer  Maternal Grandfather   . Brain cancer Neg Hx     SOCIAL HISTORY: Social History   Socioeconomic History  . Marital status: Single    Spouse name: Not on file  . Number of children: Not on file  . Years of education: some colleg  . Highest education level: Not on file  Social Needs  . Financial resource strain: Not on file  . Food insecurity - worry: Not on file  . Food insecurity - inability: Not on file  . Transportation needs - medical: Not on file  . Transportation needs - non-medical: Not on file  Occupational History  . Occupation: disabled  Tobacco Use  . Smoking status: Never Smoker  . Smokeless tobacco: Never Used  Substance and Sexual Activity  . Alcohol use: Yes    Comment: occasional  . Drug use: No  . Sexual activity: Not on file  Other Topics Concern  . Not on file  Social History Narrative   Lives with mother, in 2 story home, no pets. 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    REVIEW OF SYSTEMS: Constitutional: No fevers, chills, or sweats, no generalized fatigue, change in appetite Eyes: No visual changes, double vision, eye pain Ear, nose and throat: No hearing loss, ear pain, nasal congestion, sore throat Cardiovascular: No chest pain, palpitations Respiratory:  No shortness of breath at rest or with exertion, wheezes GastrointestinaI: No nausea, vomiting, diarrhea, abdominal pain, fecal incontinence Genitourinary:  No dysuria, urinary retention or frequency Musculoskeletal:  No neck pain, back pain Integumentary: No rash, pruritus, skin lesions Neurological: as above Psychiatric: No depression, insomnia, anxiety Endocrine: No palpitations, fatigue, diaphoresis, mood swings, change in appetite, change in weight, increased thirst Hematologic/Lymphatic:  No purpura, petechiae. Allergic/Immunologic: no itchy/runny eyes, nasal congestion, recent allergic reactions, rashes  PHYSICAL EXAM: Vitals:   06/28/17 0929  BP: 128/84  Pulse: 98  SpO2:  97%   General: No acute distress.  Patient appears well-groomed.  Morbidly obese body habitus. Head:  Normocephalic/atraumatic Eyes:  Fundi examined but not visualized Neck: supple, no paraspinal tenderness, full range of motion Heart:  Regular rate and rhythm Lungs:  Clear to auscultation bilaterally Back: No paraspinal tenderness Neurological Exam: alert and oriented to person, place, and time. Attention span and concentration intact, recent and remote memory intact, fund of knowledge intact.  Speech fluent and not dysarthric, language intact.  CN II-XII intact. Bulk and tone normal, muscle strength 5/5 throughout.  Sensation to light touch  intact.  Deep tendon reflexes 2+ throughout.  Finger to nose testing intact.  Gait normal, Romberg negative.  IMPRESSION: 1.  Chronic migraine 2.  Black out spells.  Semiology of some of the spells, such as while standing or walking, are consistent with syncope.  However, the blackouts while lounging are unusual.  He reports it feels different than just having fell asleep on the couch.  We must consider seizures. 3.  DNET 4.  Morbid obesity  PLAN:  1.  Titrate topiramate to goal of 100mg  twice daily. 2.  He will remain on Keppra 500mg  twice daily for now.  If doing well at follow up appointment, plan would be to taper off of Keppra and remain on topiramate for both migraine and seizure prophylaxis. 3.  Due to recurrence of spells, we will order another MRI of brain with and without contrast to evaluate for any growth of recurrent tumor. 4.  Mr. Pechacek should modify lifestyle with diet and exercise to help lose weight. 5.  Follow up in 6 to 8 weeks.  Metta Clines, DO  CC:  Mackie Pai, PA-C

## 2017-06-28 NOTE — Progress Notes (Signed)
Called and spoke with Nicole Kindred in scheduling with [facility]. She is to contact Pt directly to schedule.

## 2017-07-07 ENCOUNTER — Ambulatory Visit (INDEPENDENT_AMBULATORY_CARE_PROVIDER_SITE_OTHER): Payer: Self-pay | Admitting: Neurology

## 2017-07-07 DIAGNOSIS — D332 Benign neoplasm of brain, unspecified: Secondary | ICD-10-CM

## 2017-07-07 DIAGNOSIS — R55 Syncope and collapse: Secondary | ICD-10-CM

## 2017-07-09 ENCOUNTER — Telehealth: Payer: Self-pay

## 2017-07-09 NOTE — Telephone Encounter (Signed)
-----   Message from Pieter Partridge, DO sent at 07/09/2017 12:31 PM EST ----- EEG is normal

## 2017-07-09 NOTE — Progress Notes (Signed)
ELECTROENCEPHALOGRAM REPORT  Dates of Recording: 07/07/2017 at 12:11 PM to 07/08/2017 at 12:35 PM Patient's Name: Donald Mayer MRN: [id number] Date of Birth: 10/28/1985  Referring Provider: Metta Clines, DO  Procedure: 24-hour ambulatory EEG  History: 32 year old male with migraines and DNET presents for black out spells  Medications: Topiramate, levetiracetam  Technical Summary: This is a 24-hour multichannel digital EEG recording measured by the international 10-20 system with electrodes applied with paste and impedances below 5000 ohms performed as portable with EKG monitoring.  The digital EEG was referentially recorded, reformatted, and digitally filtered in a variety of bipolar and referential montages for optimal display.    DESCRIPTION OF RECORDING: During maximal wakefulness, the background activity consisted of a symmetric 10Hz  posterior dominant rhythm which was reactive to eye opening.  There were no epileptiform discharges or focal slowing seen in wakefulness.  During the recording, the patient progresses through wakefulness, drowsiness, and Stage 2 sleep.  Again, there were no epileptiform discharges seen.  Events:  There were no electrographic seizures seen.  EKG lead was unremarkable.  IMPRESSION: This 24-hour ambulatory EEG study is normal.    CLINICAL CORRELATION: A normal EEG does not exclude a clinical diagnosis of epilepsy.  If further clinical questions remain, inpatient video EEG monitoring may be helpful.   Metta Clines, DO

## 2017-07-09 NOTE — Telephone Encounter (Signed)
Called and spoke with Pt, advsd him of normal EEG results

## 2017-07-10 ENCOUNTER — Ambulatory Visit (HOSPITAL_COMMUNITY): Admission: RE | Admit: 2017-07-10 | Payer: Self-pay | Source: Ambulatory Visit

## 2017-07-17 ENCOUNTER — Ambulatory Visit (HOSPITAL_COMMUNITY)
Admission: RE | Admit: 2017-07-17 | Discharge: 2017-07-17 | Disposition: A | Discharge status: Home / Self Care | Payer: Self-pay | Source: Ambulatory Visit | Attending: Neurology | Admitting: Neurology

## 2017-07-17 DIAGNOSIS — D332 Benign neoplasm of brain, unspecified: Secondary | ICD-10-CM | POA: Insufficient documentation

## 2017-08-02 ENCOUNTER — Telehealth: Payer: Self-pay | Admitting: Neurology

## 2017-08-02 NOTE — Telephone Encounter (Signed)
Patient called needing to have his MRI rescheduled. Please Call. Thanks

## 2017-08-03 NOTE — Telephone Encounter (Signed)
[facility] radiology scheduling, spoke with [facility], she will contact Pt directly to schedule. Called Pt, LM on VM advising.

## 2017-08-06 ENCOUNTER — Telehealth: Payer: Self-pay | Admitting: *Deleted

## 2017-08-06 NOTE — Telephone Encounter (Signed)
Received request for Medical records from [facility], forwarded to [place] for email/scan/SLS 02/22

## 2017-08-11 ENCOUNTER — Ambulatory Visit (HOSPITAL_COMMUNITY)
Admission: RE | Admit: 2017-08-11 | Discharge: 2017-08-11 | Disposition: A | Discharge status: Home / Self Care | Payer: Self-pay | Source: Ambulatory Visit | Attending: Neurology | Admitting: Neurology

## 2017-08-11 DIAGNOSIS — D332 Benign neoplasm of brain, unspecified: Secondary | ICD-10-CM | POA: Insufficient documentation

## 2017-08-11 LAB — CREATININE, SERUM
CREATININE: 1.26 mg/dL — AB (ref 0.61–1.24)
GFR calc Af Amer: >60 mL/min (ref >60)
GFR calc non Af Amer: >60 mL/min (ref >60)

## 2017-08-11 MED ORDER — GADOBENATE DIMEGLUMINE 529 MG/ML IV SOLN
20.0000 mL | Freq: Once | INTRAVENOUS | Status: AC | PRN
  Administered 2017-08-11: 20 mL via INTRAVENOUS

## 2017-08-13 ENCOUNTER — Other Ambulatory Visit (INDEPENDENT_AMBULATORY_CARE_PROVIDER_SITE_OTHER): Payer: Self-pay

## 2017-08-13 ENCOUNTER — Other Ambulatory Visit: Payer: Self-pay

## 2017-08-13 ENCOUNTER — Ambulatory Visit (INDEPENDENT_AMBULATORY_CARE_PROVIDER_SITE_OTHER): Payer: Self-pay | Admitting: Neurology

## 2017-08-13 ENCOUNTER — Encounter: Payer: Self-pay | Admitting: Neurology

## 2017-08-13 VITALS — BP 118/70 | HR 66 | Resp 14 | Ht 74.0 in | Wt 375.0 lb

## 2017-08-13 DIAGNOSIS — D332 Benign neoplasm of brain, unspecified: Secondary | ICD-10-CM

## 2017-08-13 DIAGNOSIS — G43709 Chronic migraine without aura, not intractable, without status migrainosus: Secondary | ICD-10-CM

## 2017-08-13 DIAGNOSIS — R55 Syncope and collapse: Secondary | ICD-10-CM

## 2017-08-13 LAB — COMPREHENSIVE METABOLIC PANEL
ALT: 17 U/L (ref 0–53)
AST: 19 U/L (ref 0–37)
Albumin: 3.6 g/dL (ref 3.5–5.2)
Alkaline Phosphatase: 91 U/L (ref 39–117)
BUN: 13 mg/dL (ref 6–23)
CALCIUM: 9.5 mg/dL (ref 8.4–10.5)
CO2: 26 meq/L (ref 19–32)
CREATININE: 1.12 mg/dL (ref 0.40–1.50)
Chloride: 103 mEq/L (ref 96–112)
GFR: 99.48 mL/min (ref >60.00)
Glucose, Bld: 97 mg/dL (ref 70–99)
POTASSIUM: 4.1 meq/L (ref 3.5–5.1)
SODIUM: 136 meq/L (ref 135–145)
Total Bilirubin: 0.4 mg/dL (ref 0.2–1.2)
Total Protein: 8 g/dL (ref 6.0–8.3)

## 2017-08-13 LAB — CBC
HCT: 40.6 % (ref 39.0–52.0)
Hemoglobin: 13.5 g/dL (ref 13.0–17.0)
MCHC: 33.2 g/dL (ref 30.0–36.0)
MCV: 82.5 fl (ref 78.0–100.0)
PLATELETS: 263 10*3/uL (ref 150.0–400.0)
RBC: 4.92 Mil/uL (ref 4.22–5.81)
RDW: 15.2 % (ref 11.5–15.5)
WBC: 6.7 10*3/uL (ref 4.0–10.5)

## 2017-08-13 LAB — TSH: TSH: 1.45 u[IU]/mL (ref 0.35–4.50)

## 2017-08-13 MED ORDER — DIVALPROEX SODIUM 500 MG PO DR TAB: 500.0000 mg | DELAYED_RELEASE_TABLET | Freq: Two times a day (BID) | ORAL | 2 refills | Status: DC

## 2017-08-13 NOTE — Progress Notes (Signed)
NEUROLOGY FOLLOW UP OFFICE NOTE  Erez A Ledbetter [id number]  HISTORY OF PRESENT ILLNESS: Donald Mayer is a 32 year old male who follows up for chronic migraine, black out spells and DNET.  He is accompanied by his grandmother who supplements history.   UPDATE: Last visit, he reported recurrence of black-out spells.  Topiramte was titrated to 100mg  twice daily. EEG from 07/07/17 was normal.  He had a repeat MRI of brain with and without contrast on 08/11/17, which was personally reviewed and showed "slightly progressed T2/FLAIR signal abnormality along the superior margin of the right frontal resection cavity, suspicious for possible locally recurrent disease.  No significant mass effect."  Headaches are unchanged. Intensity:  Moderate to severe Duration:  Until he falls asleep Frequency:  Twice daily (morning and evening) Current abortive medication:  He is not taking any pain reliever.  He is unable to afford sumatriptan 100mg  as h e is without insurance. Current preventative medication:  Keppra 500mg  twice daily.  He was taking topiramate 100mg  twice daily but ran out a week ago.  He says he cannot afford it.  He reports that he is more unsteady on his feet. He still has spells but they occur "once in a while".   HISTORY: On 04/28/16, he had 2 episodes of syncope.  Afterwards, he developed a mild headache.  CT of head from 04/30/16 revealed area of low attenuation in the right frontotemporal region.  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  He underwent a frontal craniotomy on 05/04/16 for resection of the tumor.  Postoperative MRI revealed postsurgical changes but no residual tumor.  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  EEG at that time was normal.  He is on Keppra as a prophylactic but has not had a seizure.   Migraines started around this  time.  They are right frontal, throbbing, 9-10/10 intensity and associated with nausea, photophobia, and phonophobia.  There is no preceding aura.  They last until he goes to sleep and occurs daily.  They are triggered or aggravated by light and sound and are relieved by sleep.  He has tried ibuprofen (takes daily) and Fioricet.  He is often unable to function.   He had another MRI of brain without contrast on 09/17/16, which revealed low level T2 and FLAIR signal around the region of resection which could represent sequelae of previous surgery, but could not exclude recurrent tumor.  He followed up with neurosurgery and is monitoring for now.  To further assess syncope, he had a 24 hour holter monitor in June 2018, which was unremarkable.  In October 2018, he started having black-out spells again.  Sometimes, it occurs when he is standing on his feet, such as in the shower or walking down the stairs.  He feels lightheaded and then passes out.  However, he also reports episodes while just sitting or reclining on the couch or bed.  He has no preceding warning.  He wakes up and notes a loss of time.  Family members have not reported any noticeable seizure-like activity if he is sleeping or unconscious on the couch.  It occurs once in a while.  PAST MEDICAL HISTORY: Past Medical History:  Diagnosis Date  . Back injuries     MEDICATIONS: Current Outpatient Medications on File Prior to Visit  Medication Sig Dispense Refill  .  butalbital-acetaminophen-caffeine (FIORICET WITH CODEINE) 50-325-40-30 MG capsule 1 tab po q 6 hours as needed ha (Patient not taking: Reported on 06/28/2017) 12 capsule 0  . ibuprofen (ADVIL,MOTRIN) 200 MG tablet Take 200 mg by mouth every 6 (six) hours as needed.    Marland Kitchen omeprazole (PRILOSEC) 20 MG capsule Take 1 capsule (20 mg total) by mouth daily. (Patient not taking: Reported on 03/17/2017) 30 capsule 3  . SUMAtriptan (IMITREX) 100 MG tablet Take 1 tablet earliest onset of headache.   May repeat x1 in 2 hours if headache persists or recurs. (Patient not taking: Reported on 06/28/2017) 10 tablet 2   No current facility-administered medications on file prior to visit.     ALLERGIES: No Known Allergies  FAMILY HISTORY: Family History  Problem Relation Age of Onset  . Breast cancer Maternal Grandmother   . Lung cancer Maternal Grandfather   . Brain cancer Neg Hx     SOCIAL HISTORY: Social History   Socioeconomic History  . Marital status: Single    Spouse name: Not on file  . Number of children: Not on file  . Years of education: some colleg  . Highest education level: Not on file  Social Needs  . Financial resource strain: Not on file  . Food insecurity - worry: Not on file  . Food insecurity - inability: Not on file  . Transportation needs - medical: Not on file  . Transportation needs - non-medical: Not on file  Occupational History  . Occupation: disabled  Tobacco Use  . Smoking status: Never Smoker  . Smokeless tobacco: Never Used  Substance and Sexual Activity  . Alcohol use: Yes    Comment: occasional  . Drug use: No  . Sexual activity: Not on file  Other Topics Concern  . Not on file  Social History Narrative   Lives with mother, in 2 story home, no pets. 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    REVIEW OF SYSTEMS: Constitutional: No fevers, chills, or sweats, no generalized fatigue, change in appetite Eyes: Probable suabconjunctival hemorrhage in left eye.  No visual changes, double vision, eye pain Ear, nose and throat: No hearing loss, ear pain, nasal congestion, sore throat Cardiovascular: No chest pain, palpitations Respiratory:  No shortness of breath at rest or with exertion, wheezes GastrointestinaI: No nausea, vomiting, diarrhea, abdominal pain, fecal incontinence Genitourinary:  No dysuria, urinary retention or frequency Musculoskeletal:  No neck pain, back pain Integumentary: No rash, pruritus, skin  lesions Neurological: as above Psychiatric: No depression, insomnia, anxiety Endocrine: No palpitations, fatigue, diaphoresis, mood swings, change in appetite, change in weight, increased thirst Hematologic/Lymphatic:  No purpura, petechiae. Allergic/Immunologic: no itchy/runny eyes, nasal congestion, recent allergic reactions, rashes  PHYSICAL EXAM: Vitals:   08/13/17 1327  BP: 118/70  Pulse: 66  Resp: 14   General: No acute distress.  Patient appears well-groomed.  Morbidly obese body habitus. Head:  Normocephalic/atraumatic Eyes:  Fundi examined but not visualized Neck: supple, no paraspinal tenderness, full range of motion Heart:  Regular rate and rhythm Lungs:  Clear to auscultation bilaterally Back: No paraspinal tenderness Neurological Exam: alert and oriented to person, place, and time. Attention span and concentration intact, recent and remote memory intact, fund of knowledge intact.  Speech fluent and not dysarthric, language intact.  CN II-XII intact. Bulk and tone normal, muscle strength 5/5 throughout.  Sensation to light touch  intact.  Deep tendon reflexes 2+ throughout.  Finger to nose testing intact.  Gait normal, Romberg negative.  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  Chronic migraine without aura 3.  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  Morbid obesity.  PLAN: 1.  1.  Start divalproex DR 500mg  twice daily. 2.  Decrease Keppra to 500mg  at bedtime for 1 week, then stop 3.  Check CBC, CMP and TSH.  4.  Advised to follow up with his neurosurgeon, Dr. Kathyrn Sheriff, regarding MRI findings. 5.  Follow up with me in 3 months.  Repeat CBC and CMP prior to follow up. 6.  Weight loss  Metta Clines, DO  CC:  Mackie Pai, PA-C  Consuella Lose, MD

## 2017-08-13 NOTE — Patient Instructions (Addendum)
1.  Start divalproex 500mg  twice daily. 2.  Decrease Keppra to 1 pill at bedtime for 1 week, then stop 3.  Check CBC, CMP and TSH.  4.  Follow up with Dr. Casimer Bilis 5.  Follow up with me in 3 months.  Repeat CBC and CMP prior to follow up.

## 2017-11-01 ENCOUNTER — Telehealth: Payer: Self-pay | Admitting: Neurology

## 2017-11-01 NOTE — Telephone Encounter (Signed)
Pt wanted to see Dr Tomi Likens sooner because his medication is not working and headaches are worse, did schedule pt for Friday

## 2017-11-05 ENCOUNTER — Encounter: Payer: Self-pay | Admitting: Medical

## 2017-11-05 ENCOUNTER — Encounter: Payer: Self-pay | Admitting: Neurology

## 2017-11-05 ENCOUNTER — Ambulatory Visit (INDEPENDENT_AMBULATORY_CARE_PROVIDER_SITE_OTHER): Payer: Self-pay | Admitting: Neurology

## 2017-11-05 VITALS — BP 178/78 | HR 83 | Ht 74.0 in | Wt 368.0 lb

## 2017-11-05 DIAGNOSIS — G43709 Chronic migraine without aura, not intractable, without status migrainosus: Secondary | ICD-10-CM

## 2017-11-05 DIAGNOSIS — R55 Syncope and collapse: Secondary | ICD-10-CM

## 2017-11-05 DIAGNOSIS — D332 Benign neoplasm of brain, unspecified: Secondary | ICD-10-CM

## 2017-11-05 DIAGNOSIS — I10 Essential (primary) hypertension: Secondary | ICD-10-CM

## 2017-11-05 MED ORDER — SUMATRIPTAN SUCCINATE 100 MG PO TABS: 100.0000 mg | ORAL_TABLET | Freq: Once | ORAL | 2 refills | Status: DC | PRN

## 2017-11-05 MED ORDER — AMITRIPTYLINE HCL 25 MG PO TABS: 25.0000 mg | ORAL_TABLET | Freq: Every day | ORAL | 3 refills | Status: DC

## 2017-11-05 MED ORDER — LEVETIRACETAM 750 MG PO TABS: 750.0000 mg | ORAL_TABLET | Freq: Two times a day (BID) | ORAL | 3 refills | Status: DC

## 2017-11-05 NOTE — Patient Instructions (Signed)
1.  Start levetiracetam (Keppra) 750mg  twice daily 2.  Start amitriptyline 25mg  at bedtime daily 3.  At earliest onset of migraine, take sumatriptan 100mg .  May repeat once after 2 hours if needed, not to exceed 2 tablets in 24 hours 4.  Limit use of all pain relievers to no more than 2 days out of the week.   This includes sumatriptan, ibuprofen, Tylenol, etc. 5.  Keep headache diary 6.  Follow up in 3 months.

## 2017-11-05 NOTE — Progress Notes (Signed)
NEUROLOGY FOLLOW UP OFFICE NOTE  Donald Mayer [id number]  HISTORY OF PRESENT ILLNESS: Donald Mayer is a 32 year old male who follows up for chronic migraine, black out spells and DNET.  He is accompanied by his grandmother who supplements history.   UPDATE: Last visit, he reported recurrence of black-out spells.  Topiramte was titrated to 100mg  twice daily. In March, topiramate was stopped because he said he couldn't afford it and headaches were not improved (in fact, headaches were worse).  He was instead started on Depakote 500mg  twice daily.  He was also tapered off of Keppra. he stopped Depakote because headaches became worse and he was unable to afford it. Intensity:  Moderate to severe Duration:  Until he falls asleep Frequency:  daily Current abortive medication:  Ibuprofen (every other day Current preventative medication: none   As MRI from 08/11/17 was suspicious for tumor recurrence, he was advised to follow up with his neurosurgeon, Dr. Kathyrn Sheriff, who recommended continued monitoring.  He still has blackout spells.  They occur about once a week.  08/13/17 LABS:  CBC with WBC 6.7, HGB 13.5, HCT 40.6, PLT 263; CMP with Na 136, K 4.1, Cl 103, CO2 26, glucose 97, BUN 13, Cr 1.12, t bili 0.4, ALP 91, AST 19, ALT 17; TSH 1.45.   HISTORY: On 04/28/16, he had 2 episodes of syncope.  Afterwards, he developed a mild headache.  CT of head from 04/30/16 revealed area of low attenuation in the right frontotemporal region.  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  He underwent a frontal craniotomy on 05/04/16 for resection of the tumor.  Postoperative MRI revealed postsurgical changes but no residual tumor.  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  EEG at that time was normal.  He is on Keppra as a prophylactic but has not had a seizure.   Migraines  started around this time.  They are right frontal, throbbing, 9-10/10 intensity and associated with nausea, photophobia, and phonophobia.  There is no preceding aura.  They last until he goes to sleep and occurs daily.  They are triggered or aggravated by light and sound and are relieved by sleep.  He has tried ibuprofen (takes daily) and Fioricet.  He is often unable to function.  He was previously on topiramate 100mg  twice daily but discontinued because he could not afford it.   He had another MRI of brain without contrast on 09/17/16, which revealed low level T2 and FLAIR signal around the region of resection which could represent sequelae of previous surgery, but could not exclude recurrent tumor.  He followed up with neurosurgery and is monitoring for now.  To further assess syncope, he had a 24 hour holter monitor in June 2018, which was unremarkable.   In October 2018, he started having black-out spells again.  Sometimes, it occurs when he is standing on his feet, such as in the shower or walking down the stairs.  He feels lightheaded and then passes out.  However, he also reports episodes while just sitting or reclining on the couch or bed.  He has no preceding warning.  He wakes up and notes a loss of time.  Family members have not reported any noticeable seizure-like activity if he is sleeping or unconscious on the couch.  It occurs once in a while.  EEG from 07/07/17  was normal.  He had a repeat MRI of brain with and without contrast on 08/11/17, which was personally reviewed and showed "slightly progressed T2/FLAIR signal abnormality along the superior margin of the right frontal resection cavity, suspicious for possible locally recurrent disease.  No significant mass effect."  PAST MEDICAL HISTORY: Past Medical History:  Diagnosis Date  . Back injuries     MEDICATIONS: Current Outpatient Medications on File Prior to Visit  Medication Sig Dispense Refill  . butalbital-acetaminophen-caffeine  (FIORICET WITH CODEINE) 50-325-40-30 MG capsule 1 tab po q 6 hours as needed ha (Patient not taking: Reported on 06/28/2017) 12 capsule 0  . divalproex (DEPAKOTE) 500 MG DR tablet Take 1 tablet (500 mg total) by mouth 2 (two) times daily. 60 tablet 2  . ibuprofen (ADVIL,MOTRIN) 200 MG tablet Take 200 mg by mouth every 6 (six) hours as needed.    Marland Kitchen omeprazole (PRILOSEC) 20 MG capsule Take 1 capsule (20 mg total) by mouth daily. (Patient not taking: Reported on 03/17/2017) 30 capsule 3  . SUMAtriptan (IMITREX) 100 MG tablet Take 1 tablet earliest onset of headache.  May repeat x1 in 2 hours if headache persists or recurs. (Patient not taking: Reported on 06/28/2017) 10 tablet 2   No current facility-administered medications on file prior to visit.     ALLERGIES: No Known Allergies  FAMILY HISTORY: Family History  Problem Relation Age of Onset  . Breast cancer Maternal Grandmother   . Lung cancer Maternal Grandfather   . Brain cancer Neg Hx     SOCIAL HISTORY: Social History   Socioeconomic History  . Marital status: Single    Spouse name: Not on file  . Number of children: Not on file  . Years of education: some colleg  . Highest education level: Not on file  Occupational History  . Occupation: disabled  Social Needs  . Financial resource strain: Not on file  . Food insecurity:    Worry: Not on file    Inability: Not on file  . Transportation needs:    Medical: Not on file    Non-medical: Not on file  Tobacco Use  . Smoking status: Never Smoker  . Smokeless tobacco: Never Used  Substance and Sexual Activity  . Alcohol use: Yes    Comment: occasional  . Drug use: No  . Sexual activity: Not on file  Lifestyle  . Physical activity:    Days per week: Not on file    Minutes per session: Not on file  . Stress: Not on file  Relationships  . Social connections:    Talks on phone: Not on file    Gets together: Not on file    Attends religious service: Not on file    Active  member of club or organization: Not on file    Attends meetings of clubs or organizations: Not on file    Relationship status: Not on file  . Intimate partner violence:    Fear of current or ex partner: Not on file    Emotionally abused: Not on file    Physically abused: Not on file    Forced sexual activity: Not on file  Other Topics Concern  . Not on file  Social History Narrative   Lives with mother, in 2 story home, no pets. 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    REVIEW OF SYSTEMS: Constitutional: No fevers, chills, or sweats, no generalized fatigue, change in appetite Eyes: No visual changes, double vision, eye pain  Ear, nose and throat: No hearing loss, ear pain, nasal congestion, sore throat Cardiovascular: No chest pain, palpitations Respiratory:  No shortness of breath at rest or with exertion, wheezes GastrointestinaI: No nausea, vomiting, diarrhea, abdominal pain, fecal incontinence Genitourinary:  No dysuria, urinary retention or frequency Musculoskeletal:  No neck pain, back pain Integumentary: No rash, pruritus, skin lesions Neurological: as above Psychiatric: No depression, insomnia, anxiety Endocrine: No palpitations, fatigue, diaphoresis, mood swings, change in appetite, change in weight, increased thirst Hematologic/Lymphatic:  No purpura, petechiae. Allergic/Immunologic: no itchy/runny eyes, nasal congestion, recent allergic reactions, rashes  PHYSICAL EXAM: There were no vitals filed for this visit. General: No acute distress.  Patient appears well-groomed.  Morbidly obese body habitus. Head:  Normocephalic/atraumatic Eyes:  Fundi examined but not visualized Neck: supple, no paraspinal tenderness, full range of motion Heart:  Regular rate and rhythm Lungs:  Clear to auscultation bilaterally Back: No paraspinal tenderness Neurological Exam: alert and oriented to person, place, and time. Attention span and concentration intact, recent and remote  memory intact, fund of knowledge intact.  Speech fluent and not dysarthric, language intact.  CN II-XII intact. Bulk and tone normal, muscle strength 5/5 throughout.  Sensation to light touch  intact.  Deep tendon reflexes 2+ throughout, toes downgoing  Finger to nose testing intact.  Gait normal, Romberg negative.   IMPRESSION: 1.  DNET  2.  Chronic migraine without aura 3.  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  Morbid obesity (BMI 47.25 kg/m2) 5.  HTN  PLAN: To help with medication affordability, provided him a Good Rx card. 1.  Start Keppra 750mg  twice daily for seizure prophylaxis 2.  Start amitriptyline 25mg  at bedtime for migraine preventative. We can increase dose in 4 weeks if needed. 3.  Sumatriptan 100mg  for abortive therapy 4.  Limit use of pain relievers to no more than 2 days out of week to prevent rebound headache 5.  Keep headache diary 6.  Diet, weight loss, hydration 7.  Follow up with PCP regarding blood pressure 8.  Follow up in 3 months.  Donald Clines, DO  CC: Mackie Pai, PA-C

## 2017-11-26 ENCOUNTER — Ambulatory Visit: Payer: Self-pay | Admitting: Neurology

## 2017-12-07 ENCOUNTER — Ambulatory Visit (HOSPITAL_BASED_OUTPATIENT_CLINIC_OR_DEPARTMENT_OTHER)
Admission: RE | Admit: 2017-12-07 | Discharge: 2017-12-07 | Disposition: A | Discharge status: Home / Self Care | Payer: Self-pay | Source: Ambulatory Visit | Attending: Family Medicine | Admitting: Family Medicine

## 2017-12-07 ENCOUNTER — Ambulatory Visit (INDEPENDENT_AMBULATORY_CARE_PROVIDER_SITE_OTHER): Payer: Self-pay | Admitting: Family Medicine

## 2017-12-07 ENCOUNTER — Encounter: Payer: Self-pay | Admitting: Family Medicine

## 2017-12-07 VITALS — BP 129/65 | HR 78 | Temp 98.0°F | Resp 18 | Ht 74.0 in | Wt 377.0 lb

## 2017-12-07 DIAGNOSIS — M545 Low back pain, unspecified: Secondary | ICD-10-CM

## 2017-12-07 DIAGNOSIS — M5440 Lumbago with sciatica, unspecified side: Secondary | ICD-10-CM

## 2017-12-07 DIAGNOSIS — M47814 Spondylosis without myelopathy or radiculopathy, thoracic region: Secondary | ICD-10-CM | POA: Insufficient documentation

## 2017-12-07 MED ORDER — CYCLOBENZAPRINE HCL 10 MG PO TABS: 10.0000 mg | ORAL_TABLET | Freq: Three times a day (TID) | ORAL | 0 refills | Status: DC | PRN

## 2017-12-07 MED ORDER — TRAMADOL HCL 50 MG PO TABS: 50.0000 mg | ORAL_TABLET | Freq: Three times a day (TID) | ORAL | 0 refills | Status: DC | PRN

## 2017-12-07 MED FILL — CYCLOBENZAPRINE HCL 10 MG T: 10 | 10 days supply | Qty: 30 | Fill #0

## 2017-12-07 MED FILL — traMADol HCL 50 MG TABS: 50 | 10 days supply | Qty: 30 | Fill #0

## 2017-12-07 NOTE — Patient Instructions (Signed)

## 2017-12-07 NOTE — Progress Notes (Signed)
Subjective:  I acted as a Education administrator for [employer]. Donald Mayer, Donald Mayer   Patient ID: Donald Mayer, male    DOB: 12/03/1985, 32 y.o.   MRN: [id number]  Chief Complaint  Patient presents with  . Back Pain    whole right side on going 4 month    HPI  Patient is in today for right lower back pain.  Patient Care Team: Saguier, Iris Pert as PCP - General (Internal Medicine)   Past Medical History:  Diagnosis Date  . Back injuries     Past Surgical History:  Procedure Laterality Date  . APPLICATION OF CRANIAL NAVIGATION N/A 05/04/2016   Procedure: APPLICATION OF CRANIAL NAVIGATION;  Surgeon: Consuella Lose, MD;  Location: [place];  Service: Neurosurgery;  Laterality: N/A;  . ORIF WRIST FRACTURE Left 12/15/2012   Procedure: OPEN REDUCTION INTERNAL FIXATION (ORIF) WRIST FRACTURE; Left.  Reduction, repair, and reconnstruction of perilunate fracture dislocation;  Surgeon: Roseanne Kaufman, MD;  Location: [place];  Service: Orthopedics;  Laterality: Left;  . STERIOTACTIC STIMULATOR INSERTION Right 05/04/2016   Procedure: STERIOTACTIC RIGHT FRONTAL CRANIOTOMY with BrainLab;  Surgeon: Consuella Lose, MD;  Location: [place];  Service: Neurosurgery;  Laterality: Right;  right    Family History  Problem Relation Age of Onset  . Breast cancer Maternal Grandmother   . Lung cancer Maternal Grandfather   . Brain cancer Neg Hx     Social History   Socioeconomic History  . Marital status: Single    Spouse name: Not on file  . Number of children: Not on file  . Years of education: some colleg  . Highest education level: Not on file  Occupational History  . Occupation: disabled  Social Needs  . Financial resource strain: Not on file  . Food insecurity:    Worry: Not on file    Inability: Not on file  . Transportation needs:    Medical: Not on file    Non-medical: Not on file  Tobacco Use  . Smoking status: Never Smoker  . Smokeless tobacco: Never Used  Substance and Sexual  Activity  . Alcohol use: Yes    Comment: occasional  . Drug use: No  . Sexual activity: Not on file  Lifestyle  . Physical activity:    Days per week: Not on file    Minutes per session: Not on file  . Stress: Not on file  Relationships  . Social connections:    Talks on phone: Not on file    Gets together: Not on file    Attends religious service: Not on file    Active member of club or organization: Not on file    Attends meetings of clubs or organizations: Not on file    Relationship status: Not on file  . Intimate partner violence:    Fear of current or ex partner: Not on file    Emotionally abused: Not on file    Physically abused: Not on file    Forced sexual activity: Not on file  Other Topics Concern  . Not on file  Social History Narrative   Lives with mother, in 2 story home, no pets. 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    Outpatient Medications Prior to Visit  Medication Sig Dispense Refill  . amitriptyline (ELAVIL) 25 MG tablet Take 1 tablet (25 mg total) by mouth at bedtime. 30 tablet 3  . butalbital-acetaminophen-caffeine (FIORICET WITH CODEINE) 50-325-40-30 MG capsule 1 tab po q 6 hours as needed ha  12 capsule 0  . ibuprofen (ADVIL,MOTRIN) 200 MG tablet Take 200 mg by mouth every 6 (six) hours as needed.    . levETIRAcetam (KEPPRA) 750 MG tablet Take 1 tablet (750 mg total) by mouth 2 (two) times daily. 60 tablet 3  . omeprazole (PRILOSEC) 20 MG capsule Take 1 capsule (20 mg total) by mouth daily. 30 capsule 3  . SUMAtriptan (IMITREX) 100 MG tablet Take 1 tablet earliest onset of headache.  May repeat x1 in 2 hours if headache persists or recurs. 10 tablet 2  . SUMAtriptan (IMITREX) 100 MG tablet Take 1 tablet (100 mg total) by mouth once as needed for up to 1 dose for migraine. May repeat in 2 hours if headache persists or recurs. No more than 2 doses in 24 hours 10 tablet 2  . divalproex (DEPAKOTE) 500 MG DR tablet Take 1 tablet (500 mg total) by  mouth 2 (two) times daily. 60 tablet 2   No facility-administered medications prior to visit.     No Known Allergies  Review of Systems  Constitutional: Negative for chills, fever and malaise/fatigue.  HENT: Negative for congestion and hearing loss.   Eyes: Negative for discharge.  Respiratory: Negative for cough, sputum production and shortness of breath.   Cardiovascular: Negative for chest pain, palpitations and leg swelling.  Gastrointestinal: Negative for abdominal pain, blood in stool, constipation, diarrhea, heartburn, nausea and vomiting.  Genitourinary: Negative for dysuria, frequency, hematuria and urgency.  Musculoskeletal: Negative for back pain, falls and myalgias.  Skin: Negative for rash.  Neurological: Negative for dizziness, sensory change, loss of consciousness, weakness and headaches.  Endo/Heme/Allergies: Negative for environmental allergies. Does not bruise/bleed easily.  Psychiatric/Behavioral: Negative for depression and suicidal ideas. The patient is not nervous/anxious and does not have insomnia.        Objective:    Physical Exam  Constitutional: He is oriented to person, place, and time. Vital signs are normal. He appears well-developed and well-nourished. He is sleeping.  HENT:  Head: Normocephalic and atraumatic.  Mouth/Throat: Oropharynx is clear and moist.  Eyes: Pupils are equal, round, and reactive to light. EOM are normal.  Neck: Normal range of motion. Neck supple. No thyromegaly present.  Cardiovascular: Normal rate and regular rhythm.  No murmur heard. Pulmonary/Chest: Effort normal and breath sounds normal. No respiratory distress. He has no wheezes. He has no rales. He exhibits no tenderness.  Musculoskeletal: Normal range of motion. He exhibits no edema, tenderness or deformity.  Neurological: He is alert and oriented to person, place, and time. He displays normal reflexes. No sensory deficit. He exhibits normal muscle tone.  Normal heel  walk Normal toe walk Some radiation of pain with slr but Full ROm  Skin: Skin is warm and dry.  Psychiatric: He has a normal mood and affect. His behavior is normal. Judgment and thought content normal.  Nursing note and vitals reviewed.   BP 129/65 (BP Location: Left Arm, Patient Position: Sitting, Cuff Size: Large)   Pulse 78   Temp 98 F (36.7 C) (Oral)   Resp 18   Ht 6\' 2"  (1.88 m)   Wt (!) 377 lb (171 kg)   SpO2 96%   BMI 48.40 kg/m  Wt Readings from Last 3 Encounters:  12/07/17 (!) 377 lb (171 kg)  11/05/17 (!) 368 lb (166.9 kg)  08/13/17 (!) 375 lb (170.1 kg)   BP Readings from Last 3 Encounters:  12/07/17 129/65  11/05/17 (!) 178/78  08/13/17 118/70  There is no immunization history on file for this patient.  Health Maintenance  Topic Date Due  . HIV Screening  04/28/2003  . TETANUS/TDAP  04/28/2007  . INFLUENZA VACCINE  01/13/2018    Lab Results  Component Value Date   WBC 6.7 08/13/2017   HGB 13.5 08/13/2017   HCT 40.6 08/13/2017   PLT 263.0 08/13/2017   GLUCOSE 97 08/13/2017   ALT 17 08/13/2017   AST 19 08/13/2017   NA 136 08/13/2017   K 4.1 08/13/2017   CL 103 08/13/2017   CREATININE 1.12 08/13/2017   BUN 13 08/13/2017   CO2 26 08/13/2017   TSH 1.45 08/13/2017    Lab Results  Component Value Date   TSH 1.45 08/13/2017   Lab Results  Component Value Date   WBC 6.7 08/13/2017   HGB 13.5 08/13/2017   HCT 40.6 08/13/2017   MCV 82.5 08/13/2017   PLT 263.0 08/13/2017   Lab Results  Component Value Date   NA 136 08/13/2017   K 4.1 08/13/2017   CO2 26 08/13/2017   GLUCOSE 97 08/13/2017   BUN 13 08/13/2017   CREATININE 1.12 08/13/2017   BILITOT 0.4 08/13/2017   ALKPHOS 91 08/13/2017   AST 19 08/13/2017   ALT 17 08/13/2017   PROT 8.0 08/13/2017   ALBUMIN 3.6 08/13/2017   CALCIUM 9.5 08/13/2017   ANIONGAP 8 02/23/2017   GFR 99.48 08/13/2017   No results found for: CHOL No results found for: HDL No results found for:  LDLCALC No results found for: TRIG No results found for: CHOLHDL No results found for: HGBA1C       Assessment & Plan:   Problem List Items Addressed This Visit    None    Visit Diagnoses    Low back pain with radiation    -  Primary   Relevant Medications   cyclobenzaprine (FLEXERIL) 10 MG tablet   traMADol (ULTRAM) 50 MG tablet   Other Relevant Orders   DG Lumbar Spine Complete    f/u 2 week or sooner prn PT if no improvement or consider MRI  I have discontinued Tylin A. Seats's divalproex. I am also having him start on cyclobenzaprine and traMADol. Additionally, I am having him maintain his butalbital-acetaminophen-caffeine, omeprazole, ibuprofen, SUMAtriptan, levETIRAcetam, amitriptyline, and SUMAtriptan.  Meds ordered this encounter  Medications  . cyclobenzaprine (FLEXERIL) 10 MG tablet    Sig: Take 1 tablet (10 mg total) by mouth 3 (three) times daily as needed for muscle spasms.    Dispense:  30 tablet    Refill:  0  . traMADol (ULTRAM) 50 MG tablet    Sig: Take 1 tablet (50 mg total) by mouth every 8 (eight) hours as needed.    Dispense:  30 tablet    Refill:  0    CMA served as scribe during this visit. History, Physical and Plan performed by medical provider. Documentation and orders reviewed and attested to.  Ann Held, DO

## 2017-12-08 ENCOUNTER — Encounter: Payer: Self-pay | Admitting: Family Medicine

## 2017-12-09 NOTE — Telephone Encounter (Signed)
Arthritic changes usually

## 2017-12-10 NOTE — Telephone Encounter (Signed)
Injury to spine due to over use normally causing pain-- can do pt or refer to ortho

## 2017-12-20 ENCOUNTER — Ambulatory Visit (INDEPENDENT_AMBULATORY_CARE_PROVIDER_SITE_OTHER): Payer: Self-pay | Admitting: Medical

## 2017-12-20 ENCOUNTER — Encounter: Payer: Self-pay | Admitting: Medical

## 2017-12-20 VITALS — BP 122/78 | HR 76 | Temp 98.2°F | Resp 16 | Ht 74.0 in | Wt 378.4 lb

## 2017-12-20 DIAGNOSIS — R519 Headache, unspecified: Secondary | ICD-10-CM

## 2017-12-20 DIAGNOSIS — R51 Headache: Secondary | ICD-10-CM

## 2017-12-20 DIAGNOSIS — M545 Low back pain: Secondary | ICD-10-CM

## 2017-12-20 MED ORDER — PREDNISONE 10 MG PO TABS: ORAL_TABLET | ORAL | 0 refills | Status: DC

## 2017-12-20 NOTE — Progress Notes (Signed)
Subjective:    Patient ID: Donald Mayer, male    DOB: December 01, 1985, 32 y.o.   MRN: [id number]  HPI  Pt in for follow up.  He updates me that he has been seeing Dr. Tomi Likens.  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  Per pt he did EEG at home for 24 hours and no definite seizure seen.Pt is on keppra.  For migraine pt on elevil per pt.  He has followed up with neurosurgeon as well. Pt mri did not show definite reoccurance of prior tumor. Plan is for him to follow up years with surgeon and may do repeat imaging.    Pt seen by Dr. Etter Sjogren end of June for back pain. On xray mild djd thoracic spine.  Pt states pain is moderate to severe. He feels pain worse in morning. Moderate to high level. Also pain present after he sits a while. Sometimes notes some pain that up his knee upward toward his back.But not reporting pain shooting down his leg. No numbness to foot. Pain present for about 4 months. Pain present despite use of flexeril and tramadol. Neurologist told him not to take nsaids.  Pt still has flexeril and tramadol.    Review of Systems  Constitutional: Negative for chills, fatigue and fever.  Respiratory: Negative for cough, chest tightness, shortness of breath and wheezing.   Cardiovascular: Negative for chest pain and palpitations.  Gastrointestinal: Negative for abdominal distention, blood in stool, diarrhea, nausea and vomiting.  Musculoskeletal: Positive for back pain. Negative for neck pain and neck stiffness.  Skin: Negative for rash.  Neurological: Negative for dizziness, syncope, weakness, numbness and headaches.       See hpi.  Hematological: Negative for adenopathy. Does not bruise/bleed easily.  Psychiatric/Behavioral: Negative for confusion.   Past Medical History:  Diagnosis Date  . Back injuries      Social History   Socioeconomic History  . Marital status: Single    Spouse name: Not on file  . Number of  children: Not on file  . Years of education: some colleg  . Highest education level: Not on file  Occupational History  . Occupation: disabled  Social Needs  . Financial resource strain: Not on file  . Food insecurity:    Worry: Not on file    Inability: Not on file  . Transportation needs:    Medical: Not on file    Non-medical: Not on file  Tobacco Use  . Smoking status: Never Smoker  . Smokeless tobacco: Never Used  Substance and Sexual Activity  . Alcohol use: Yes    Comment: occasional  . Drug use: No  . Sexual activity: Not on file  Lifestyle  . Physical activity:    Days per week: Not on file    Minutes per session: Not on file  . Stress: Not on file  Relationships  . Social connections:    Talks on phone: Not on file    Gets together: Not on file    Attends religious service: Not on file    Active member of club or organization: Not on file    Attends meetings of clubs or organizations: Not on file    Relationship status: Not on file  . Intimate partner violence:    Fear of current or ex partner: Not on file    Emotionally abused: Not on file    Physically abused: Not on file    Forced sexual activity:  Not on file  Other Topics Concern  . Not on file  Social History Narrative   Lives with mother, in 2 story home, no pets. 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    Past Surgical History:  Procedure Laterality Date  . APPLICATION OF CRANIAL NAVIGATION N/A 05/04/2016   Procedure: APPLICATION OF CRANIAL NAVIGATION;  Surgeon: Consuella Lose, MD;  Location: [facility];  Service: Neurosurgery;  Laterality: N/A;  . ORIF WRIST FRACTURE Left 12/15/2012   Procedure: OPEN REDUCTION INTERNAL FIXATION (ORIF) WRIST FRACTURE; Left.  Reduction, repair, and reconnstruction of perilunate fracture dislocation;  Surgeon: Roseanne Kaufman, MD;  Location: [facility];  Service: Orthopedics;  Laterality: Left;  . STERIOTACTIC STIMULATOR INSERTION Right 05/04/2016   Procedure:  STERIOTACTIC RIGHT FRONTAL CRANIOTOMY with BrainLab;  Surgeon: Consuella Lose, MD;  Location: [facility];  Service: Neurosurgery;  Laterality: Right;  right    Family History  Problem Relation Age of Onset  . Breast cancer Maternal Grandmother   . Lung cancer Maternal Grandfather   . Brain cancer Neg Hx     No Known Allergies  Current Outpatient Medications on File Prior to Visit  Medication Sig Dispense Refill  . amitriptyline (ELAVIL) 25 MG tablet Take 1 tablet (25 mg total) by mouth at bedtime. 30 tablet 3  . butalbital-acetaminophen-caffeine (FIORICET WITH CODEINE) 50-325-40-30 MG capsule 1 tab po q 6 hours as needed ha 12 capsule 0  . cyclobenzaprine (FLEXERIL) 10 MG tablet Take 1 tablet (10 mg total) by mouth 3 (three) times daily as needed for muscle spasms. 30 tablet 0  . ibuprofen (ADVIL,MOTRIN) 200 MG tablet Take 200 mg by mouth every 6 (six) hours as needed.    . levETIRAcetam (KEPPRA) 750 MG tablet Take 1 tablet (750 mg total) by mouth 2 (two) times daily. 60 tablet 3  . omeprazole (PRILOSEC) 20 MG capsule Take 1 capsule (20 mg total) by mouth daily. 30 capsule 3  . SUMAtriptan (IMITREX) 100 MG tablet Take 1 tablet earliest onset of headache.  May repeat x1 in 2 hours if headache persists or recurs. 10 tablet 2  . SUMAtriptan (IMITREX) 100 MG tablet Take 1 tablet (100 mg total) by mouth once as needed for up to 1 dose for migraine. May repeat in 2 hours if headache persists or recurs. No more than 2 doses in 24 hours 10 tablet 2  . traMADol (ULTRAM) 50 MG tablet Take 1 tablet (50 mg total) by mouth every 8 (eight) hours as needed. 30 tablet 0   No current facility-administered medications on file prior to visit.     BP 122/78   Pulse 76   Temp 98.2 F (36.8 C) (Oral)   Resp 16   Ht 6\' 2"  (1.88 m)   Wt (!) 378 lb 6.4 oz (171.6 kg)   SpO2 98%   BMI 48.58 kg/m       Objective:   Physical Exam  General Mental Status- Alert. General Appearance- Not in acute distress.     Skin General: Color- Normal Color. Moisture- Normal Moisture.  Neck Carotid Arteries- Normal color. Moisture- Normal Moisture. No carotid bruits. No JVD.  Chest and Lung Exam Auscultation: Breath Sounds:-Normal.  Cardiovascular Auscultation:Rythm- Regular. Murmurs & Other Heart Sounds:Auscultation of the heart reveals- No Murmurs.  Abdomen Inspection:-Inspeection Normal. Palpation/Percussion:Note:No mass. Palpation and Percussion of the abdomen reveal- Non Tender, Non Distended + BS, no rebound or guarding.    Neurologic Cranial Nerve exam:- CN III-XII intact(No nystagmus), symmetric smile. Strength:- 5/5 equal  and symmetric strength both upper and lower extremities. l5-s1 sensation intact.  Back- rt si area tenderness to palpation. No mid spine tenderness.       Assessment & Plan:  For low back pain/sciatica, I prescribed tapered prednisone. Can continue flexeril and tramadol as well.   If pain persist can refer to sports med. Do exercise for back as tolerated.  For history of ha, history of tumor and possible seizures follow up with specialist/follow their instruction. If you have worse symptoms/signs and need referral to expidite follow up please let me know.  Follow up in 10 days or as needed   [employer]

## 2017-12-20 NOTE — Patient Instructions (Addendum)
For low back pain/sciatica, I prescribed tapered prednisone. Can continue flexeril and tramadol as well.   If pain persist can refer to sports med. Do exercise for back as tolerated.  For history of ha, history of tumor and possible seizures follow up with specialist/follow their instruction. If you have worse symptoms/signs and need referral to expidite follow up please let me know.  Follow up in 10 days or as needed    Back Exercises If you have pain in your back, do these exercises 2-3 times each day or as told by your doctor. When the pain goes away, do the exercises once each day, but repeat the steps more times for each exercise (do more repetitions). If you do not have pain in your back, do these exercises once each day or as told by your doctor. Exercises Single Knee to Chest  Do these steps 3-5 times in a row for each leg: 1. Lie on your back on a firm bed or the floor with your legs stretched out. 2. Bring one knee to your chest. 3. Hold your knee to your chest by grabbing your knee or thigh. 4. Pull on your knee until you feel a gentle stretch in your lower back. 5. Keep doing the stretch for 10-30 seconds. 6. Slowly let go of your leg and straighten it.  Pelvic Tilt  Do these steps 5-10 times in a row: 1. Lie on your back on a firm bed or the floor with your legs stretched out. 2. Bend your knees so they point up to the ceiling. Your feet should be flat on the floor. 3. Tighten your lower belly (abdomen) muscles to press your lower back against the floor. This will make your tailbone point up to the ceiling instead of pointing down to your feet or the floor. 4. Stay in this position for 5-10 seconds while you gently tighten your muscles and breathe evenly.  Cat-Cow  Do these steps until your lower back bends more easily: 1. Get on your hands and knees on a firm surface. Keep your hands under your shoulders, and keep your knees under your hips. You may put padding under  your knees. 2. Let your head hang down, and make your tailbone point down to the floor so your lower back is round like the back of a cat. 3. Stay in this position for 5 seconds. 4. Slowly lift your head and make your tailbone point up to the ceiling so your back hangs low (sags) like the back of a cow. 5. Stay in this position for 5 seconds.  Press-Ups  Do these steps 5-10 times in a row: 1. Lie on your belly (face-down) on the floor. 2. Place your hands near your head, about shoulder-width apart. 3. While you keep your back relaxed and keep your hips on the floor, slowly straighten your arms to raise the top half of your body and lift your shoulders. Do not use your back muscles. To make yourself more comfortable, you may change where you place your hands. 4. Stay in this position for 5 seconds. 5. Slowly return to lying flat on the floor.  Bridges  Do these steps 10 times in a row: 1. Lie on your back on a firm surface. 2. Bend your knees so they point up to the ceiling. Your feet should be flat on the floor. 3. Tighten your butt muscles and lift your butt off of the floor until your waist is almost as high as your  knees. If you do not feel the muscles working in your butt and the back of your thighs, slide your feet 1-2 inches farther away from your butt. 4. Stay in this position for 3-5 seconds. 5. Slowly lower your butt to the floor, and let your butt muscles relax.  If this exercise is too easy, try doing it with your arms crossed over your chest. Belly Crunches  Do these steps 5-10 times in a row: 1. Lie on your back on a firm bed or the floor with your legs stretched out. 2. Bend your knees so they point up to the ceiling. Your feet should be flat on the floor. 3. Cross your arms over your chest. 4. Tip your chin a little bit toward your chest but do not bend your neck. 5. Tighten your belly muscles and slowly raise your chest just enough to lift your shoulder blades a tiny  bit off of the floor. 6. Slowly lower your chest and your head to the floor.  Back Lifts Do these steps 5-10 times in a row: 1. Lie on your belly (face-down) with your arms at your sides, and rest your forehead on the floor. 2. Tighten the muscles in your legs and your butt. 3. Slowly lift your chest off of the floor while you keep your hips on the floor. Keep the back of your head in line with the curve in your back. Look at the floor while you do this. 4. Stay in this position for 3-5 seconds. 5. Slowly lower your chest and your face to the floor.  Contact a doctor if:  Your back pain gets a lot worse when you do an exercise.  Your back pain does not lessen 2 hours after you exercise. If you have any of these problems, stop doing the exercises. Do not do them again unless your doctor says it is okay. Get help right away if:  You have sudden, very bad back pain. If this happens, stop doing the exercises. Do not do them again unless your doctor says it is okay. This information is not intended to replace advice given to you by your health care provider. Make sure you discuss any questions you have with your health care provider. Document Released: 07/04/2010 Document Revised: 11/07/2015 Document Reviewed: 07/26/2014 Elsevier Interactive Patient Education  [employer].

## 2018-01-03 IMAGING — CR DG CHEST 2V
2 series · 2 of 2 positions shown · non-contrast
Comparison: None available.

CLINICAL DATA: 30 y/o  M; syncope and headache.

EXAM:
CHEST  2 VIEW

[w chest pa]
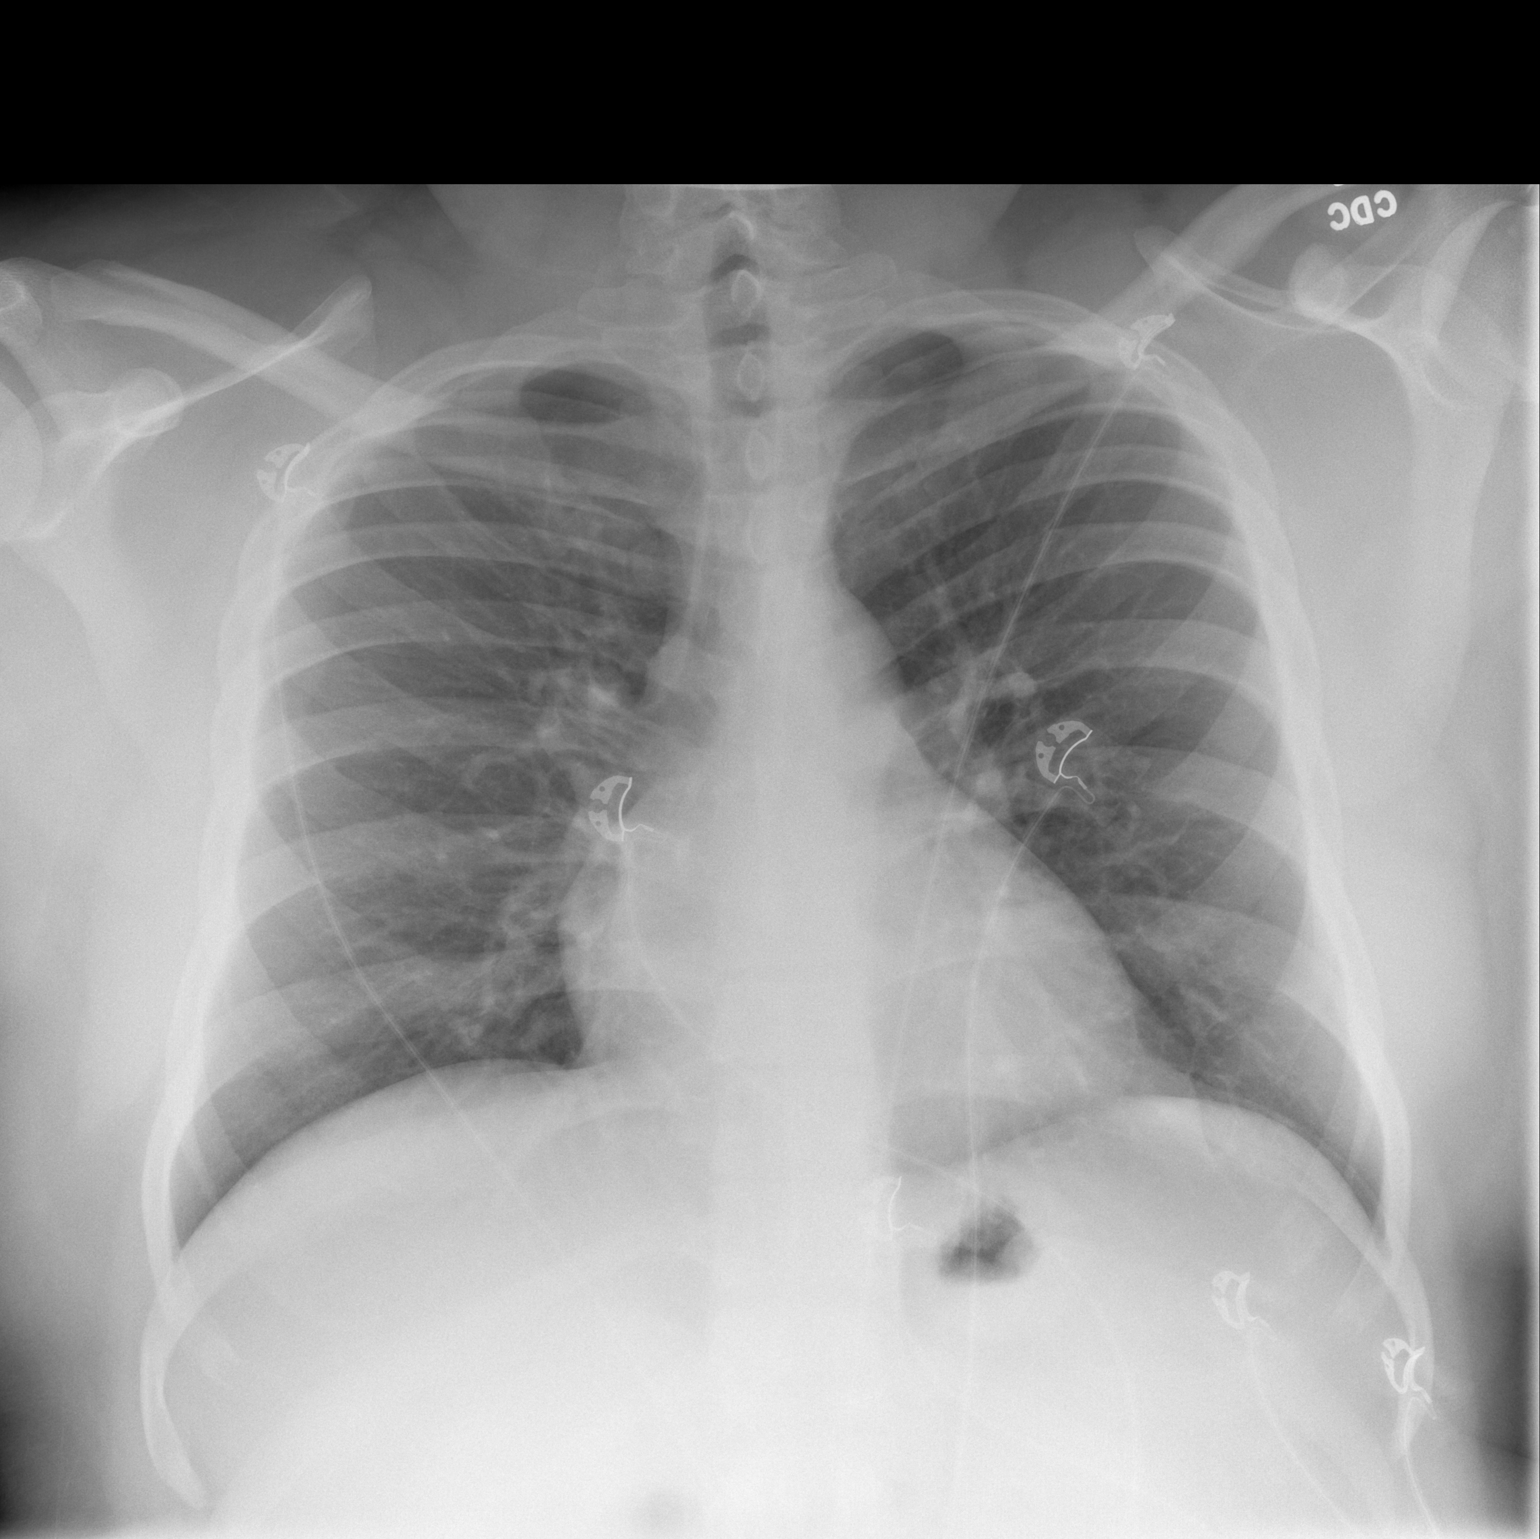

[w chest lat]
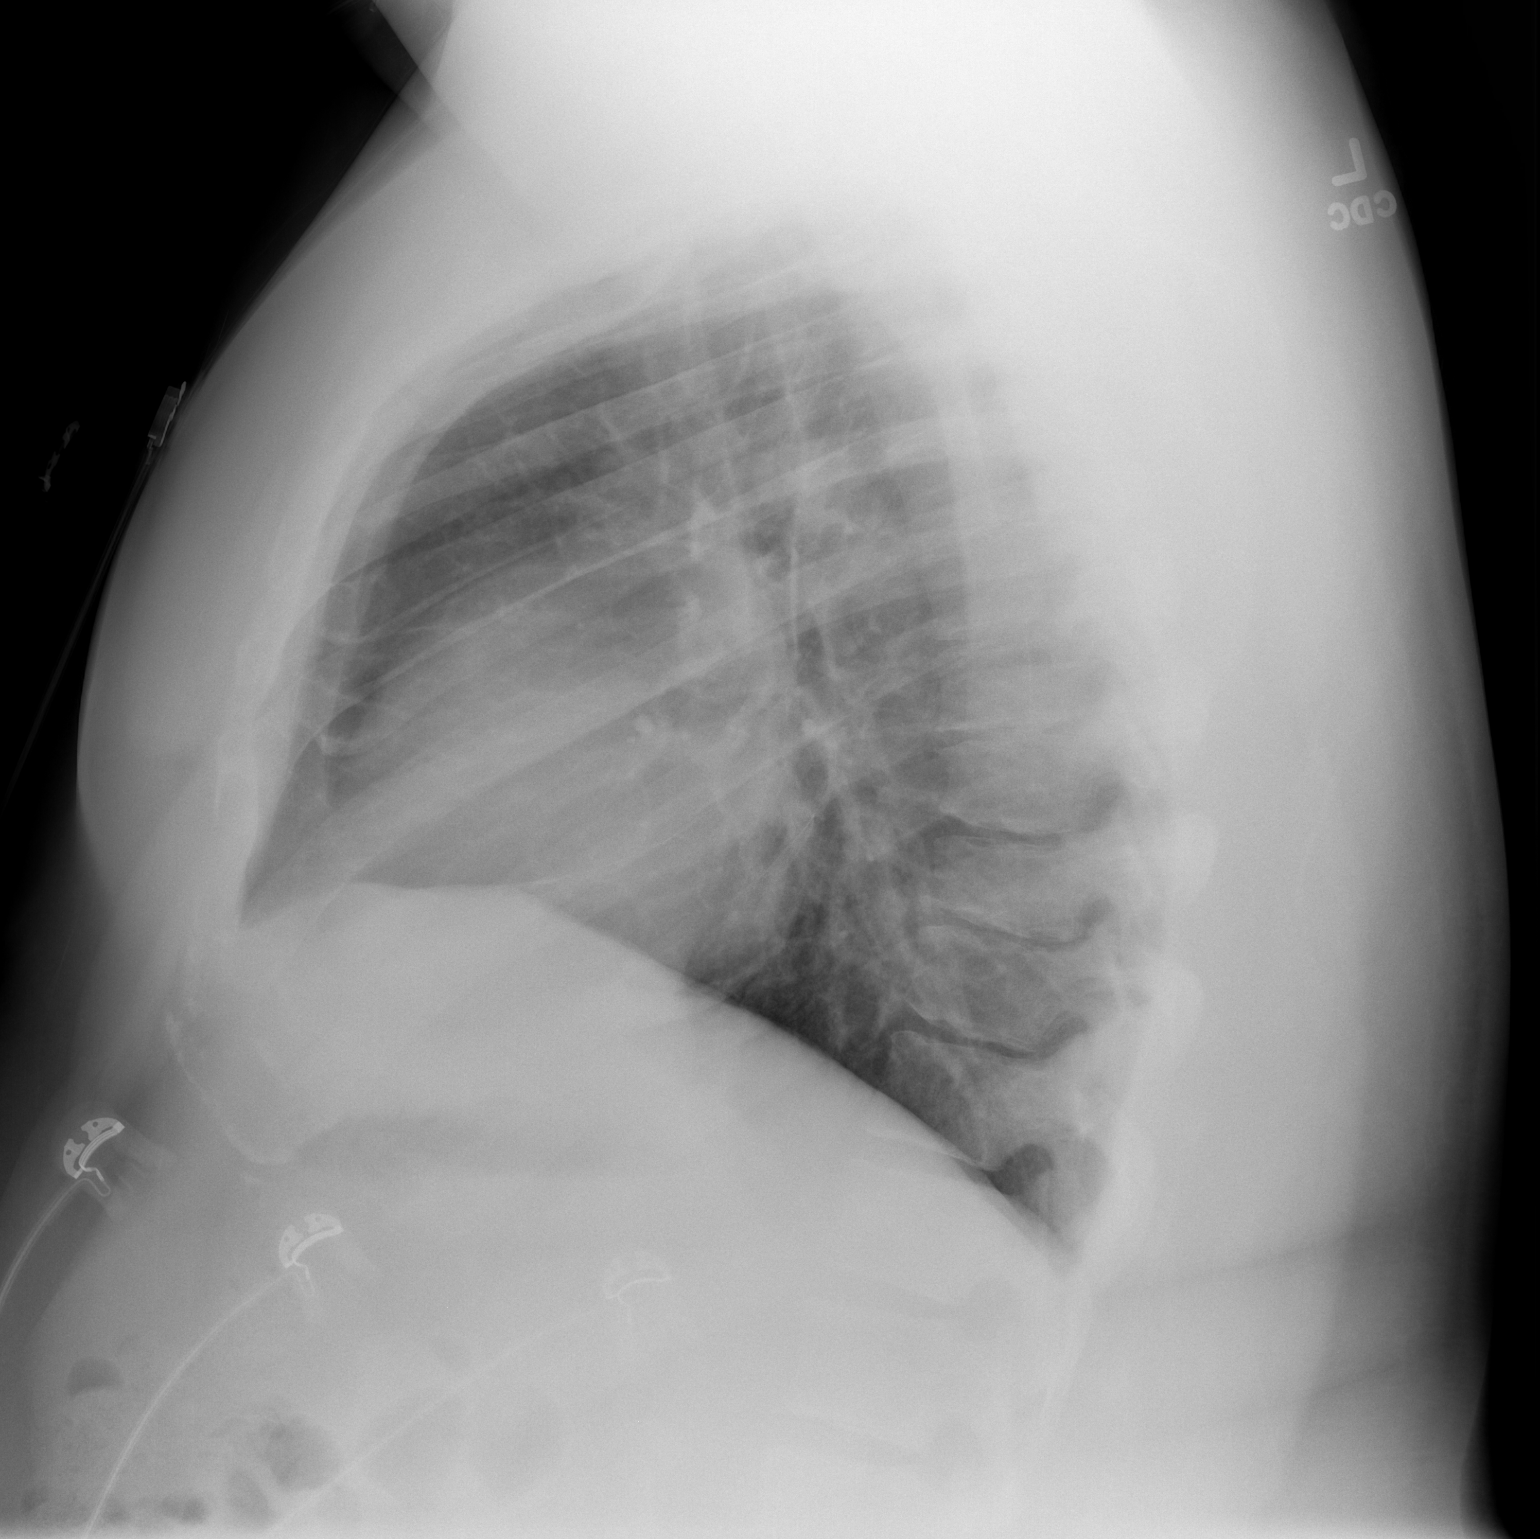

[2 of 2 positions shown; findings below may reference images not displayed]

FINDINGS: Normal cardiomediastinal silhouette. Clear lungs. No pneumothorax or
pleural effusion. Bones are unremarkable.
IMPRESSION: No active cardiopulmonary disease.

By: Drugachiji Camdzic M.D.

## 2018-01-10 IMAGING — MR MR HEAD W/O CM
9 of 11 series · 33 of 48 positions shown · non-contrast
Comparison: Preoperative MRI 05/02/2016 and earlier.

CLINICAL DATA: 30-year-old male postoperative day 1 status surgical
resection of nonenhancing right frontal brain tumor. The patient
refused IV contrast. Initial encounter.

EXAM:
MRI HEAD WITHOUT CONTRAST
TECHNIQUE: Multiplanar, multiecho pulse sequences of the brain and surrounding
structures were obtained without intravenous contrast.

[Series 3: DWI · axial · 3.0mm · 0.94mm/px · z∈[-90,+64]mm · 8 of 106 slices shown (1 of 2)]
[im 1/106]
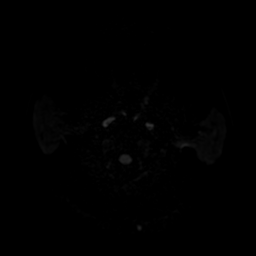
[im 12/106]
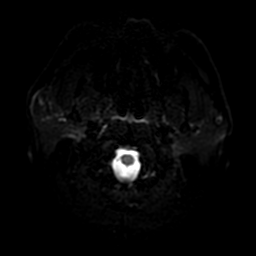
[im 36/106]
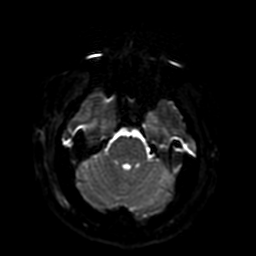
[im 47/106]
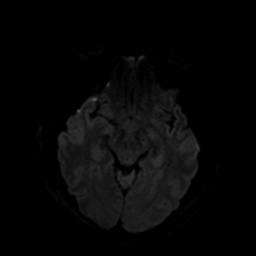
[im 59/106]
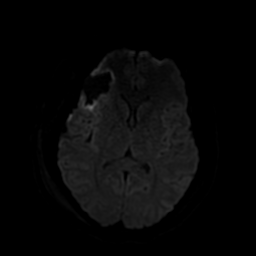
[im 71/106]
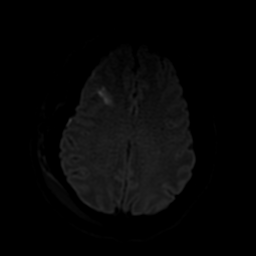
[im 94/106]
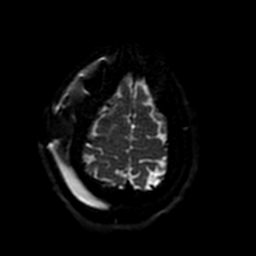
[im 106/106]
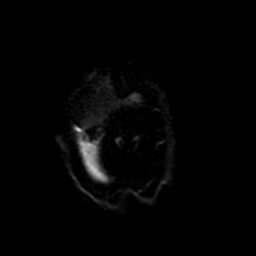

[Series 4: T2 · axial · 5.0mm · 0.47mm/px · z∈[-90,+64]mm · 2 of 27 slices shown (1 of 2)]
[im 1/27]
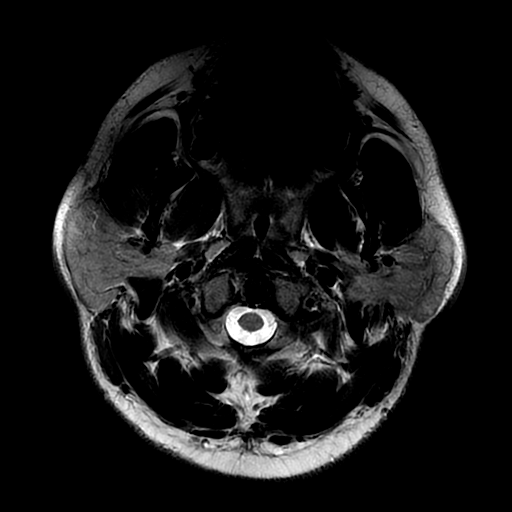
[im 27/27]
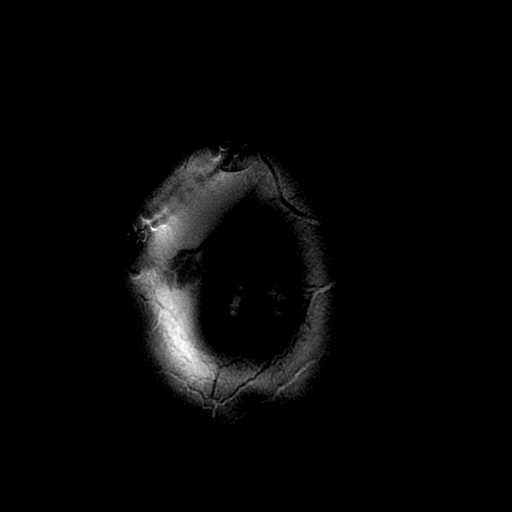

[Series 5: FLAIR · axial · 5.0mm · 0.47mm/px · z∈[-90,+64]mm · 2 of 27 slices shown (1 of 3)]
[im 1/27]
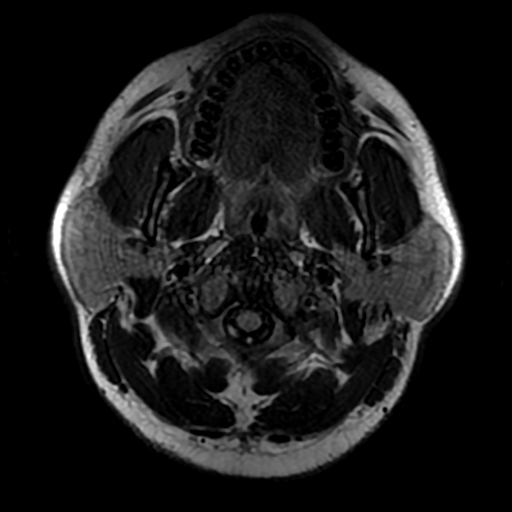
[im 27/27]
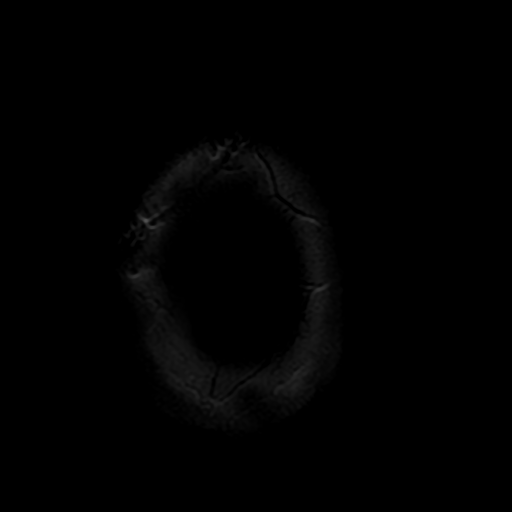

[Series 8: FLAIR · axial · 5.0mm · 0.47mm/px · z∈[-90,+64]mm · 2 of 27 slices shown (2 of 3)]
[im 1/27]
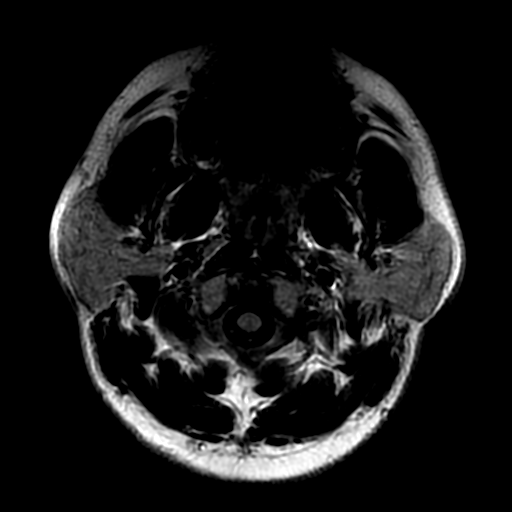
[im 27/27]
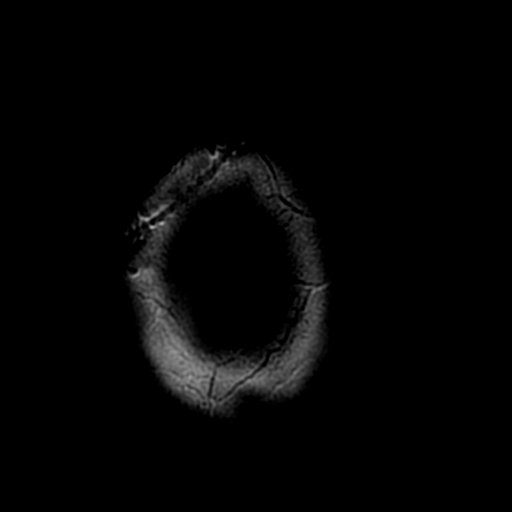

[Series 9: DWI · coronal · 4.0mm · 0.94mm/px · 6 of 72 slices shown (2 of 2)]
[im 1/72]
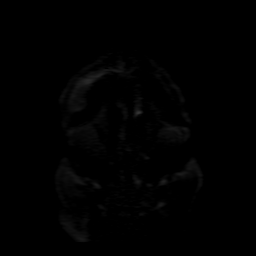
[im 15/72]
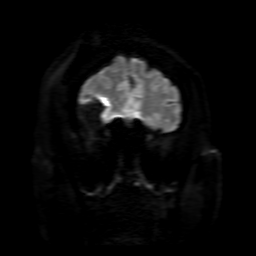
[im 29/72]
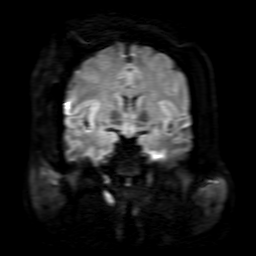
[im 43/72]
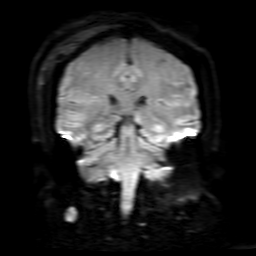
[im 57/72]
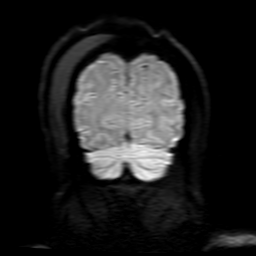
[im 72/72]
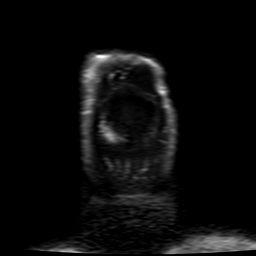

[Series 10: T2 · coronal · 5.0mm · 0.39mm/px · 3 of 35 slices shown (2 of 2)]
[im 1/35]
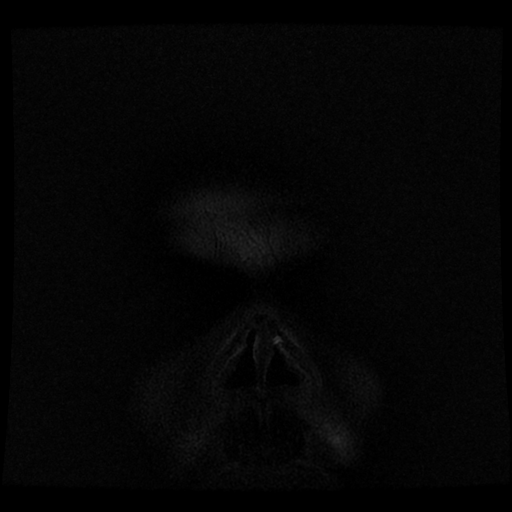
[im 18/35]
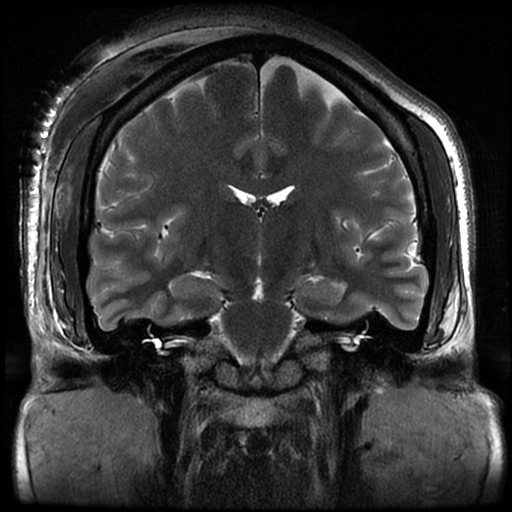
[im 35/35]
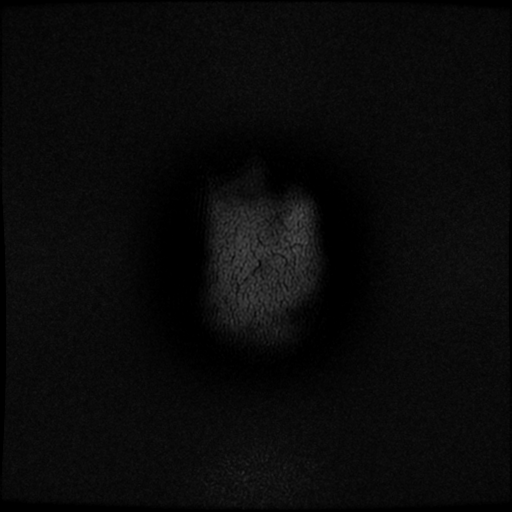

[Series 11: FLAIR · sagittal · 5.0mm · 0.47mm/px · 3 of 30 slices shown (3 of 3)]
[im 1/30]
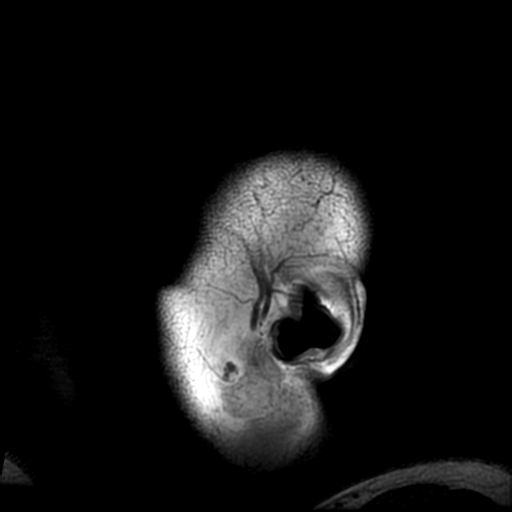
[im 15/30]
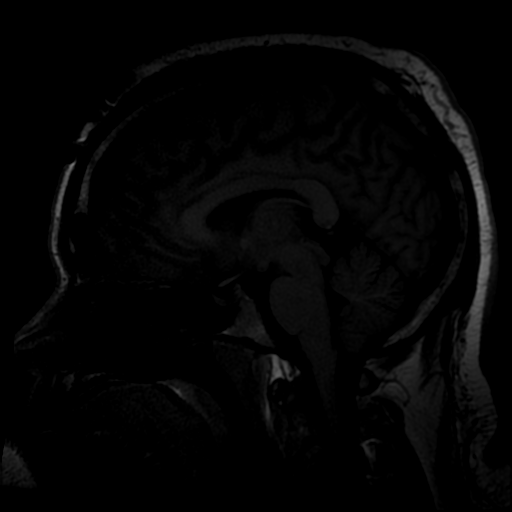
[im 30/30]
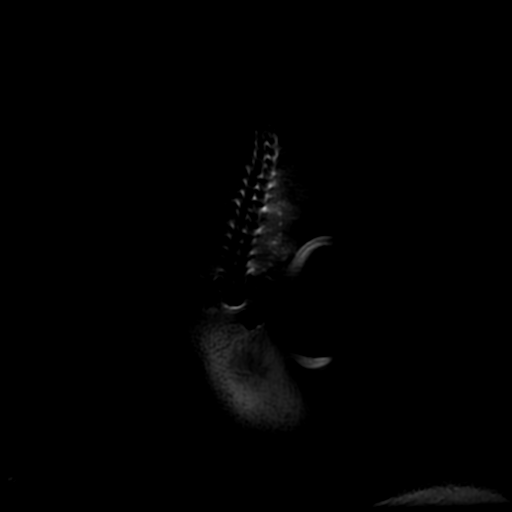

[Series 350: ADC · axial · 3.0mm · 0.94mm/px · z∈[-90,+64]mm · 4 of 53 slices shown (1 of 2)]
[im 1/53]
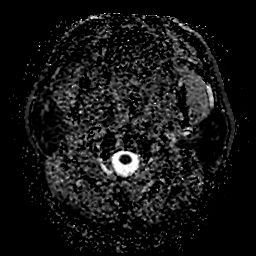
[im 18/53]
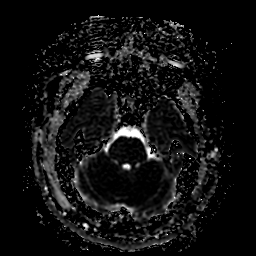
[im 35/53]
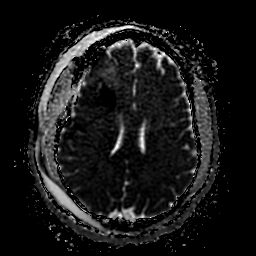
[im 53/53]
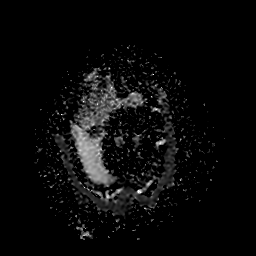

[Series 950: ADC · coronal · 4.0mm · 0.94mm/px · 3 of 36 slices shown (2 of 2)]
[im 1/36]
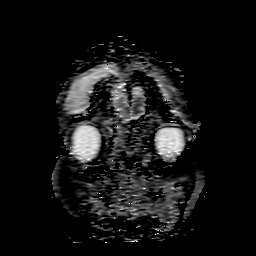
[im 18/36]
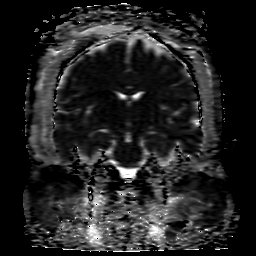
[im 36/36]
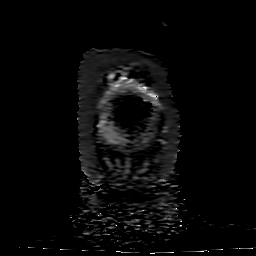

[33 of 48 positions shown; findings below may reference images not displayed]

FINDINGS: Brain: Right frontotemporal craniotomy changes. Cystic resection
cavity along the right anterior inferior frontal gyrus with small
volume blood products. Restricted diffusion along primarily the
medial surface of the resection cavity (series 3, image 33 and
series 305, image 33). Mild associated T2 and FLAIR hyperintensity.
No significant mass effect.

Small extra-axial collection underlying the craniotomy site (series
10, image 23). No other extra-axial collection. No other restricted
diffusion. No midline shift or ventriculomegaly. Cervicomedullary
junction and pituitary are within normal limits. Outside of the
resection cavity findings above gray and white matter signal remains
normal. The patient refused IV contrast, although the tumor
preoperatively was nonenhancing.

Vascular: Major intracranial vascular flow voids are stable.

Skull and upper cervical spine: Negative.

Sinuses/Orbits: Negative.

Other: Broad-based right scalp hematoma. Overlying skin staples.
Trace right mastoid fluid. Negative nasopharynx.
IMPRESSION: Suspect gross total resection of the anterior right frontal lobe
tumor.

Postoperative changes including small area of ischemia surrounding
the resection cavity which is felt to explain associated T2 and
FLAIR hyperintensity, attention directed on follow-up.

No unexpected intracranial hemorrhage or significant intracranial
mass effect.

The patient refused IV contrast, but the tumor was non-enhancing on
preoperative imaging.

## 2018-02-28 ENCOUNTER — Ambulatory Visit (INDEPENDENT_AMBULATORY_CARE_PROVIDER_SITE_OTHER): Payer: Medicaid - Out of State | Admitting: Neurology

## 2018-02-28 ENCOUNTER — Encounter: Payer: Self-pay | Admitting: Neurology

## 2018-02-28 VITALS — BP 130/86 | HR 86 | Ht 74.0 in | Wt 373.0 lb

## 2018-02-28 DIAGNOSIS — R55 Syncope and collapse: Secondary | ICD-10-CM | POA: Diagnosis not present

## 2018-02-28 DIAGNOSIS — G43709 Chronic migraine without aura, not intractable, without status migrainosus: Secondary | ICD-10-CM | POA: Diagnosis not present

## 2018-02-28 DIAGNOSIS — D332 Benign neoplasm of brain, unspecified: Secondary | ICD-10-CM | POA: Diagnosis not present

## 2018-02-28 MED ORDER — AMITRIPTYLINE HCL 50 MG PO TABS: 50.0000 mg | ORAL_TABLET | Freq: Every day | ORAL | 4 refills | Status: DC

## 2018-02-28 NOTE — Patient Instructions (Signed)
1.  We will increase amitriptyline to 50mg  at bedtime.  If headaches not improved in 4 weeks, contact me and we can increase dose if needed. 2.  At earliest onset of migraine, take sumatriptan 100mg .  May repeat once after two hours if needed, not to exceed 2 tablets in 24 hours. 3.  Limit use of pain relievers to no more than 2 days out of week to prevent risk of rebound or medication-overuse headache. 4.  Keep headache diary 5.  Continue Keppra 750mg  twice daily

## 2018-02-28 NOTE — Progress Notes (Signed)
NEUROLOGY FOLLOW UP OFFICE NOTE  Jb A Fronczak [id number]  HISTORY OF PRESENT ILLNESS: Donald Mayer is a 32 year old male who follows up for chronic migraine, black out spells and DNET.  He is accompanied by his grandmother who supplements history.  UPDATE: In May, he was restarted on Keppra, 750mg  twice daily, for seizure prophylaxis.  He was started on amitriptyline 25mg  at bedtime for migraine prophylaxis.  He was prescribed sumatriptan 100mg  for migraine abortive therapy.  He started having migraines are on the left side now.  They are daily and last until he goes to sleep.  Does not recall trying sumatriptan.  Rarely takes sumatriptan although may be helpful.  Takes Flexeril and rarely tramadol for back and knee pain.    He says he probably still has black out spells but he just isn't aware of them.  When the occur around his mother, she doesn't notice anything different.  HISTORY: On 04/28/16, he had 2 episodes of syncope.  Afterwards, he developed a mild headache.  CT of head from 04/30/16 revealed area of low attenuation in the right frontotemporal region.  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  He underwent a frontal craniotomy on 05/04/16 for resection of the tumor.  Postoperative MRI revealed postsurgical changes but no residual tumor.  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  EEG at that time was normal.  He is on Keppra as a prophylactic but has not had a seizure.  Migraines started around this time.  They are right frontal, throbbing, 9-10/10 intensity and associated with nausea, photophobia, and phonophobia.  There is no preceding aura.  They last until he goes to sleep and occurs daily.  They are triggered or aggravated by light and sound and are relieved by sleep.  He has tried ibuprofen (takes daily) and Fioricet.  He is often  unable to function.    He had another MRI of brain without contrast on 09/17/16, which revealed low level T2 and FLAIR signal around the region of resection which could represent sequelae of previous surgery, but could not exclude recurrent tumor.  He followed up with neurosurgery and is monitoring for now.  To further assess syncope, he had a 24 hour holter monitor in June 2018, which was unremarkable.  In October 2018, he started having black-out spells again.  Sometimes, it occurs when he is standing on his feet, such as in the shower or walking down the stairs.  He feels lightheaded and then passes out.  However, he also reports episodes while just sitting or reclining on the couch or bed.  He has no preceding warning.  He wakes up and notes a loss of time.  Family members have not reported any noticeable seizure-like activity if he is sleeping or unconscious on the couch.  It occurs once in a while.  EEG from 07/07/17 was normal.  He had a repeat MRI of brain with and without contrast on 08/11/17, which was personally reviewed and showed "slightly progressed T2/FLAIR signal abnormality along the superior margin of the right frontal resection cavity, suspicious for possible locally recurrent disease.  No significant mass effect."  He followed up with his neurosurgeon, Dr. Kathyrn Sheriff, who recommended continued monitoring.  He was previously on topiramate 100mg  twice daily but discontinued because he could not afford it.  He stopped Depakote because  he said it made his headaches worse.  PAST MEDICAL HISTORY: Past Medical History:  Diagnosis Date  . Back injuries     MEDICATIONS: Current Outpatient Medications on File Prior to Visit  Medication Sig Dispense Refill  . amitriptyline (ELAVIL) 25 MG tablet Take 1 tablet (25 mg total) by mouth at bedtime. 30 tablet 3  . butalbital-acetaminophen-caffeine (FIORICET WITH CODEINE) 50-325-40-30 MG capsule 1 tab po q 6 hours as needed ha 12 capsule 0  .  cyclobenzaprine (FLEXERIL) 10 MG tablet Take 1 tablet (10 mg total) by mouth 3 (three) times daily as needed for muscle spasms. 30 tablet 0  . ibuprofen (ADVIL,MOTRIN) 200 MG tablet Take 200 mg by mouth every 6 (six) hours as needed.    . levETIRAcetam (KEPPRA) 750 MG tablet Take 1 tablet (750 mg total) by mouth 2 (two) times daily. 60 tablet 3  . omeprazole (PRILOSEC) 20 MG capsule Take 1 capsule (20 mg total) by mouth daily. 30 capsule 3  . predniSONE (DELTASONE) 10 MG tablet 5 TAB PO DAY 1 4 TAB PO DAY 2 3 TAB PO DAY 3 2 TAB PO DAY 4 1 TAB PO DAY 5 15 tablet 0  . SUMAtriptan (IMITREX) 100 MG tablet Take 1 tablet earliest onset of headache.  May repeat x1 in 2 hours if headache persists or recurs. 10 tablet 2  . SUMAtriptan (IMITREX) 100 MG tablet Take 1 tablet (100 mg total) by mouth once as needed for up to 1 dose for migraine. May repeat in 2 hours if headache persists or recurs. No more than 2 doses in 24 hours 10 tablet 2  . traMADol (ULTRAM) 50 MG tablet Take 1 tablet (50 mg total) by mouth every 8 (eight) hours as needed. 30 tablet 0   No current facility-administered medications on file prior to visit.     ALLERGIES: No Known Allergies  FAMILY HISTORY: Family History  Problem Relation Age of Onset  . Breast cancer Maternal Grandmother   . Lung cancer Maternal Grandfather   . Brain cancer Neg Hx     SOCIAL HISTORY: Social History   Socioeconomic History  . Marital status: Single    Spouse name: Not on file  . Number of children: Not on file  . Years of education: some colleg  . Highest education level: Not on file  Occupational History  . Occupation: disabled  Social Needs  . Financial resource strain: Not on file  . Food insecurity:    Worry: Not on file    Inability: Not on file  . Transportation needs:    Medical: Not on file    Non-medical: Not on file  Tobacco Use  . Smoking status: Never Smoker  . Smokeless tobacco: Never Used  Substance and Sexual  Activity  . Alcohol use: Yes    Comment: occasional  . Drug use: No  . Sexual activity: Not on file  Lifestyle  . Physical activity:    Days per week: Not on file    Minutes per session: Not on file  . Stress: Not on file  Relationships  . Social connections:    Talks on phone: Not on file    Gets together: Not on file    Attends religious service: Not on file    Active member of club or organization: Not on file    Attends meetings of clubs or organizations: Not on file    Relationship status: Not on file  . Intimate partner violence:  Fear of current or ex partner: Not on file    Emotionally abused: Not on file    Physically abused: Not on file    Forced sexual activity: Not on file  Other Topics Concern  . Not on file  Social History Narrative   Lives with mother, in 2 story home, no pets. 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    REVIEW OF SYSTEMS: Constitutional: No fevers, chills, or sweats, no generalized fatigue, change in appetite Eyes: No visual changes, double vision, eye pain Ear, nose and throat: No hearing loss, ear pain, nasal congestion, sore throat Cardiovascular: No chest pain, palpitations Respiratory:  No shortness of breath at rest or with exertion, wheezes GastrointestinaI: No nausea, vomiting, diarrhea, abdominal pain, fecal incontinence Genitourinary:  No dysuria, urinary retention or frequency Musculoskeletal:  No neck pain, back pain Integumentary: No rash, pruritus, skin lesions Neurological: as above Psychiatric: No depression, insomnia, anxiety Endocrine: No palpitations, fatigue, diaphoresis, mood swings, change in appetite, change in weight, increased thirst Hematologic/Lymphatic:  No purpura, petechiae. Allergic/Immunologic: no itchy/runny eyes, nasal congestion, recent allergic reactions, rashes  PHYSICAL EXAM: Blood pressure 130/86, pulse 86, height 6\' 2"  (1.88 m), weight (!) 373 lb (169.2 kg), SpO2 96 %. General: No acute  distress.  Patient appears well-groomed.  Morbidly obese body habitus. Head:  Normocephalic/atraumatic Eyes:  Fundi examined but not visualized Neck: supple, no paraspinal tenderness, full range of motion Heart:  Regular rate and rhythm Lungs:  Clear to auscultation bilaterally Back: No paraspinal tenderness Neurological Exam: alert and oriented to person, place, and time. Attention span and concentration intact, recent and remote memory intact, fund of knowledge intact.  Speech fluent and not dysarthric, language intact.  CN II-XII intact. Bulk and tone normal, muscle strength 5/5 throughout.  Sensation to light touch, temperature and vibration intact.  Deep tendon reflexes 2+ throughout, toes downgoing.  Finger to nose and heel to shin testing intact.  Gait normal, Romberg negative.  IMPRESSION: 1. DNET  2. Chronic migraine without aura 3. Blackout spells.  Subjective.  Objectively, others don't notice anything.  May be seizures but uncertain 4. Morbid obesity (BMI 47.89 kg/m2)  PLAN: 1.  Increase amitriptyline to 50mg  at bedtime.  Can increase dose to 100mg  at bedtime in one month if needed. 2.  Sumatriptan 100mg  for abortive therapy 3.  Continue Keppra 750mg  twice daily 4.  Limit use of pain relievers to no more than 2 days out of week to prevent risk of rebound or medication-overuse headache. 5.  Keep headache diary 6.  Diet, weight loss, hydration, exercise, no driving 7.  Follow up DNET with Dr. Kathyrn Sheriff 8.  Follow up in 4 months.  Metta Clines, DO  CC: Mackie Pai, PA-C

## 2018-04-01 ENCOUNTER — Telehealth: Payer: Self-pay

## 2018-04-01 NOTE — Telephone Encounter (Signed)
Paperwork (LTD) was discovered on my desk from [facility] from 01/30/18. Talked with Dr Tomi Likens, he said we talked about it and he did not want to fill out, neurosurgeon should. Called Pt, he said all his Dr's receive the papers and he is receiving his payments, ok to shred.

## 2018-07-03 NOTE — Progress Notes (Signed)
NEUROLOGY FOLLOW UP OFFICE NOTE  Donald Mayer [id number]  HISTORY OF PRESENT ILLNESS: Donald Mayer is a 33 year old male who follows up for chronic migraine, blackout spells and DNET.    UPDATE: Current medications:  Keppra 750mg  twice daily for seizure prophylaxis, amitriptyline 50mg  at bedtime for migraine prophylaxis, sumatriptan 100mg  for migraine abortive therapy.  He ran out of amitriptyline a week ago.  Headaches are 6/10, lasting a couple of hours with sumatriptan and occurring 1 to 2 days a week.  No severe ones since last visit.  HISTORY: On 04/28/16, he had 2 episodes of syncope. Afterwards, he developed a mild headache. CT of head from 04/30/16 revealed area of low attenuation in the right frontotemporal region. 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 He underwent a frontal craniotomy on 05/04/16 for resection of the tumor. Postoperative MRI revealed postsurgical changes but no residual tumor. 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 EEG at that time was normal. He is on Keppra as a prophylactic but has not had a seizure.  Migraines started around this time. They are right frontal, throbbing, 9-10/10 intensity and associated with nausea, photophobia, and phonophobia. There is no preceding aura. They last until he goes to sleep and occurs daily. They are triggered or aggravated by light and sound and are relieved by sleep. He has tried ibuprofen (takes daily) and Fioricet. He is often unable to function.   He had another MRI of brain without contrast on 09/17/16, which revealed low level T2 and FLAIR signal around the region of resection which could represent sequelae of previous surgery, but could not exclude recurrent tumor. He followed up with neurosurgery and is monitoring for now. To further assess syncope, he had a 24  hour holter monitor in June 2018, which was unremarkable.  In October 2018, he started having black-out spells again. Sometimes, it occurs when he is standing on his feet, such as in the shower or walking down the stairs. He feels lightheaded and then passes out. However, he also reports episodes while just sitting or reclining on the couch or bed. He has no preceding warning. He wakes up and notes a loss of time. Family members have not reported any noticeable seizure-like activity if he is sleeping or unconscious on the couch. It occurs once in a while. EEG from 07/07/17 was normal. He had a repeat MRI of brain with and without contrast on 08/11/17, which was personally reviewed and showed "slightly progressed T2/FLAIR signal abnormality along the superior margin of the right frontal resection cavity, suspicious for possible locally recurrent disease. No significant mass effect."  He followed up with his neurosurgeon, Dr. Kathyrn Sheriff, who recommended continued monitoring.  He was previously on topiramate 100mg  twice daily but discontinued because he could not afford it.  He stopped Depakote because he said it made his headaches worse.  PAST MEDICAL HISTORY: Past Medical History:  Diagnosis Date  . Back injuries     MEDICATIONS: Current Outpatient Medications on File Prior to Visit  Medication Sig Dispense Refill  . amitriptyline (ELAVIL) 50 MG tablet Take 1 tablet (50 mg total) by mouth at bedtime. 30 tablet 4  . butalbital-acetaminophen-caffeine (FIORICET WITH CODEINE) 50-325-40-30 MG capsule 1 tab po q 6 hours as needed ha 12 capsule 0  . cyclobenzaprine (FLEXERIL) 10 MG tablet Take 1 tablet (10  mg total) by mouth 3 (three) times daily as needed for muscle spasms. 30 tablet 0  . ibuprofen (ADVIL,MOTRIN) 200 MG tablet Take 200 mg by mouth every 6 (six) hours as needed.    . levETIRAcetam (KEPPRA) 750 MG tablet Take 1 tablet (750 mg total) by mouth 2 (two) times daily. 60 tablet 3  .  omeprazole (PRILOSEC) 20 MG capsule Take 1 capsule (20 mg total) by mouth daily. (Patient not taking: Reported on 02/28/2018) 30 capsule 3  . predniSONE (DELTASONE) 10 MG tablet 5 TAB PO DAY 1 4 TAB PO DAY 2 3 TAB PO DAY 3 2 TAB PO DAY 4 1 TAB PO DAY 5 (Patient not taking: Reported on 02/28/2018) 15 tablet 0  . SUMAtriptan (IMITREX) 100 MG tablet Take 1 tablet earliest onset of headache.  May repeat x1 in 2 hours if headache persists or recurs. 10 tablet 2  . SUMAtriptan (IMITREX) 100 MG tablet Take 1 tablet (100 mg total) by mouth once as needed for up to 1 dose for migraine. May repeat in 2 hours if headache persists or recurs. No more than 2 doses in 24 hours 10 tablet 2  . traMADol (ULTRAM) 50 MG tablet Take 1 tablet (50 mg total) by mouth every 8 (eight) hours as needed. 30 tablet 0   No current facility-administered medications on file prior to visit.     ALLERGIES: No Known Allergies  FAMILY HISTORY: Family History  Problem Relation Age of Onset  . Breast cancer Maternal Grandmother   . Lung cancer Maternal Grandfather   . Brain cancer Neg Hx    SOCIAL HISTORY: Social History   Socioeconomic History  . Marital status: Single    Spouse name: Not on file  . Number of children: Not on file  . Years of education: some colleg  . Highest education level: Not on file  Occupational History  . Occupation: disabled  Social Needs  . Financial resource strain: Not on file  . Food insecurity:    Worry: Not on file    Inability: Not on file  . Transportation needs:    Medical: Not on file    Non-medical: Not on file  Tobacco Use  . Smoking status: Never Smoker  . Smokeless tobacco: Never Used  Substance and Sexual Activity  . Alcohol use: Yes    Comment: occasional  . Drug use: No  . Sexual activity: Not on file  Lifestyle  . Physical activity:    Days per week: Not on file    Minutes per session: Not on file  . Stress: Not on file  Relationships  . Social connections:      Talks on phone: Not on file    Gets together: Not on file    Attends religious service: Not on file    Active member of club or organization: Not on file    Attends meetings of clubs or organizations: Not on file    Relationship status: Not on file  . Intimate partner violence:    Fear of current or ex partner: Not on file    Emotionally abused: Not on file    Physically abused: Not on file    Forced sexual activity: Not on file  Other Topics Concern  . Not on file  Social History Narrative   Lives with mother, in 2 story home, no pets. 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    REVIEW OF SYSTEMS: Constitutional: No fevers, chills, or sweats, no  generalized fatigue, change in appetite Eyes: No visual changes, double vision, eye pain Ear, nose and throat: No hearing loss, ear pain, nasal congestion, sore throat Cardiovascular: No chest pain, palpitations Respiratory:  No shortness of breath at rest or with exertion, wheezes GastrointestinaI: No nausea, vomiting, diarrhea, abdominal pain, fecal incontinence Genitourinary:  No dysuria, urinary retention or frequency Musculoskeletal:  No neck pain, back pain Integumentary: No rash, pruritus, skin lesions Neurological: as above Psychiatric: No depression, insomnia, anxiety Endocrine: No palpitations, fatigue, diaphoresis, mood swings, change in appetite, change in weight, increased thirst Hematologic/Lymphatic:  No purpura, petechiae. Allergic/Immunologic: no itchy/runny eyes, nasal congestion, recent allergic reactions, rashes  PHYSICAL EXAM: Blood pressure 124/82, pulse 75, height 6\' 2"  (1.88 m), weight (!) 380 lb (172.4 kg), SpO2 96 %. General: No acute distress.  Patient appears well-groomed.   Head:  Normocephalic/atraumatic Eyes:  Fundi examined but not visualized Neck: supple, no paraspinal tenderness, full range of motion Heart:  Regular rate and rhythm Lungs:  Clear to auscultation bilaterally Back: No  paraspinal tenderness Neurological Exam: alert and oriented to person, place, and time. Attention span and concentration intact, recent and remote memory intact, fund of knowledge intact.  Speech fluent and not dysarthric, language intact.  CN II-XII intact. Bulk and tone normal, muscle strength 5/5 throughout.  Sensation to light touch intact.  Deep tendon reflexes 2+ throughout, toes downgoing.  Finger to nose and heel to shin testing intact.  Gait normal, Romberg negative.  IMPRESSION: 1.  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  Chronic migraine without aura, without status migrainosus, not intractable 3.  Blackout spells.  Subjective.  Objectively, others don't notice anything.  May be seizure but uncertain. 4.  Morbid obesity (BMI 48.79)  PLAN: 1.  Continue Keppra 750mg  twice daily for seizure prophylaxis.  Advised to discontinue tramadol as it may lower seizure threshold.   2. For migraine preventative management, will increase amitriptyline to 75mg  at bedtime with goal to further reduce headache frequency. 3.  For abortive therapy, sumatriptan 100mg  4.  Limit use of pain relievers to no more than 2 days out of week to prevent risk of rebound or medication-overuse headache. 5.  Keep headache diary 6.  Exercise, hydration, caffeine cessation, sleep hygiene, monitor for and avoid triggers 7.  Consider:  magnesium citrate 400mg  daily, riboflavin 400mg  daily, and coenzyme Q10 100mg  three times daily 8.  Follow up in 5 months.  Metta Clines, DO  CC:  Mackie Pai, PA-C

## 2018-07-04 ENCOUNTER — Ambulatory Visit (INDEPENDENT_AMBULATORY_CARE_PROVIDER_SITE_OTHER): Payer: Medicaid - Out of State | Admitting: Neurology

## 2018-07-04 ENCOUNTER — Encounter: Payer: Self-pay | Admitting: Neurology

## 2018-07-04 VITALS — BP 124/82 | HR 75 | Ht 74.0 in | Wt 380.0 lb

## 2018-07-04 DIAGNOSIS — R55 Syncope and collapse: Secondary | ICD-10-CM

## 2018-07-04 DIAGNOSIS — D332 Benign neoplasm of brain, unspecified: Secondary | ICD-10-CM

## 2018-07-04 DIAGNOSIS — G43009 Migraine without aura, not intractable, without status migrainosus: Secondary | ICD-10-CM | POA: Diagnosis not present

## 2018-07-04 MED ORDER — SUMATRIPTAN SUCCINATE 100 MG PO TABS: 100.0000 mg | ORAL_TABLET | Freq: Once | ORAL | 4 refills | Status: DC | PRN

## 2018-07-04 MED ORDER — AMITRIPTYLINE HCL 75 MG PO TABS: 75.0000 mg | ORAL_TABLET | Freq: Every day | ORAL | 4 refills | Status: DC

## 2018-07-04 NOTE — Patient Instructions (Signed)
1.  To help reduce migraine frequency further, we will increase amitriptyline to 75mg  at bedtime. 2.  At earliest onset of migraine, take sumatriptan 100mg .  May repeat dose once after 2 hours if needed (no more than 2 tablets in 24 hours) 3.  Continue levetiracetam (Keppra) 750mg  twice daily 4.  I would like you to stop tramadol because it may increase risk for seizures. 5.  Exercise, hydration, diet/weight loss 6.  Follow up in 5 months.

## 2018-08-18 ENCOUNTER — Other Ambulatory Visit: Payer: Self-pay | Admitting: Physician Assistant

## 2018-08-18 DIAGNOSIS — D332 Benign neoplasm of brain, unspecified: Secondary | ICD-10-CM

## 2018-09-30 ENCOUNTER — Encounter: Payer: Self-pay | Admitting: *Deleted

## 2018-09-30 ENCOUNTER — Telehealth: Payer: Self-pay | Admitting: Neurology

## 2018-09-30 NOTE — Telephone Encounter (Signed)
PLEASE ADVISE.

## 2018-09-30 NOTE — Telephone Encounter (Signed)
How are the headaches worse? 1.  If they have been more frequent, we can increase amitriptyline to 100mg  at bedtime 2.  If they are no longer responding to sumatriptan, he can try taking naproxen 500mg  with each dose of sumatriptan (we can prescribe him naproxen 500mg  twice daily as needed, #16). 3.  If he is describing an intractable headache where he has had constant headache for the past few days to week, then we can prescribe him a prednisone taper:  Prednisone 10mg  tablet-Take 6tabs x1day, then 5tabs x1day, then 4tabs x1day, then 3tabs x1day, then 2tabs x1day, then 1tab x1day, then STOP.  He shouldn't take any NSAIDs while on prednisone.  I am not sure what he means by "mushy".  If he is saying that the side of his head physically feels "mushy" or different to touch, then he should contact his neurosurgeon.

## 2018-09-30 NOTE — Telephone Encounter (Signed)
Patient said that sumatriptan and prednisone did not work.  Instructed him to increase amitriptyline to 100 mg qhs per Dr. Tomi Likens.

## 2018-09-30 NOTE — Telephone Encounter (Signed)
Migraines have gotten worse. Side of the head where he had surgery is mushy. He was asking about medications. Please call him back at [phone]. Thanks!

## 2018-10-24 ENCOUNTER — Ambulatory Visit
Admission: RE | Admit: 2018-10-24 | Discharge: 2018-10-24 | Disposition: A | Discharge status: Home / Self Care | Payer: Medicare Other | Source: Ambulatory Visit | Attending: Physician Assistant | Admitting: Physician Assistant

## 2018-10-24 ENCOUNTER — Other Ambulatory Visit: Payer: Self-pay

## 2018-10-24 ENCOUNTER — Other Ambulatory Visit: Payer: Self-pay | Admitting: Physician Assistant

## 2018-10-24 DIAGNOSIS — D332 Benign neoplasm of brain, unspecified: Secondary | ICD-10-CM

## 2018-11-10 ENCOUNTER — Telehealth: Payer: Self-pay | Admitting: Neurology

## 2018-11-10 NOTE — Telephone Encounter (Signed)
Neurosurgeon told him to see [place] sooner. He said it was regarding medication and because he has had 2 blackout seizure episodes. Can he do an Evisit and be seen sooner? Thanks.

## 2018-11-11 NOTE — Telephone Encounter (Signed)
E visit is fine

## 2018-11-15 NOTE — Progress Notes (Signed)
Virtual Visit via Video Note The purpose of this virtual visit is to provide medical care while limiting exposure to the novel coronavirus.    Consent was obtained for video visit:  Yes.   Answered questions that patient had about telehealth interaction:  Yes.   I discussed the limitations, risks, security and privacy concerns of performing an evaluation and management service by telemedicine. I also discussed with the patient that there may be a patient responsible charge related to this service. The patient expressed understanding and agreed to proceed.  Pt location: Home Physician Location: office Name of referring provider:  Saguier, Percell Miller, PA-C I connected with Rashawn A Wunschel at patients initiation/request on 11/16/2018 at 11:30 AM EDT by video enabled telemedicine application and verified that I am speaking with the correct person using two identifiers. Pt MRN:  [id number] Pt DOB:  1986-04-11 Video Participants:  Marris A Meadow   History of Present Illness:  Donald Mayer is a 33 year old male who follows up for migraines, black out spells, and DNET.  UPDATE: Current medications:  Keppra 750mg  twice daily for seizure prophylaxis, amitriptyline 100mg  twice daily for migraine prophylaxis, sumatriptan 100mg  for migraine abortive therapy.  Since last visit, he has had increased migraines.  Prednisone taper and sumatriptan ineffective.  Amitriptyline was increased to 100mg  at bedtime in April.  However, he seemed to have misunderstood the instructions and started taking 100mg  twice daily  They are severe.  They are occurring every other day to daily.  He takes a sumatriptan and goes to sleep.  After several hours, when he wakes up and headache is gone.  He had another black spell as well as two seizures (generalized tonic clonic with tongue biting) on Memorial Day weekend. He has been compliant with the [facility].  Repeat MRI of brain without contrast on 10/24/18 was  personally reviewed and showed "minimal progression of T2/FLAIR signal abnormality along the margins of the right frontal resection cavity which may reflect recurrent tumor."  Contrast was not administered due to inability to obtain IV access.  Plan is to try and resect the recurrent tumor within the next 6 months but would like to get the seizures under control first.  HISTORY: On 04/28/16, he had 2 episodes of syncope. Afterwards, he developed a mild headache. CT of head from 04/30/16 revealed area of low attenuation in the right frontotemporal region. 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 He underwent a frontal craniotomy on 05/04/16 for resection of the tumor. Postoperative MRI revealed postsurgical changes but no residual tumor. 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 EEG at that time was normal. He was placed on Keppra as a prophylactic  Migraines started around this time. They are right frontal, throbbing, 9-10/10 intensity and associated with nausea, photophobia, and phonophobia. There is no preceding aura. They last until he goes to sleep and occurs daily. They are triggered or aggravated by light and sound and are relieved by sleep. He has tried ibuprofen (takes daily) and Fioricet. He is often unable to function.   He had another MRI of brain without contrast on 09/17/16, which revealed low level T2 and FLAIR signal around the region of resection which could represent sequelae of previous surgery, but could not exclude recurrent tumor. He followed up with neurosurgery and is monitoring for now. To further assess syncope, he had  a 24 hour holter monitor in June 2018, which was unremarkable.  In October 2018, he started having black-out spells again. Sometimes, it occurs when he is standing on his feet, such as in the shower or walking down the  stairs. He feels lightheaded and then passes out. However, he also reports episodes while just sitting or reclining on the couch or bed. He has no preceding warning. He wakes up and notes a loss of time. Family members have not reported any noticeable seizure-like activity if he is sleeping or unconscious on the couch. It occurs once in a while. EEG from 07/07/17 was normal. He had a repeat MRI of brain with and without contrast on 08/11/17, which was personally reviewed and showed "slightly progressed T2/FLAIR signal abnormality along the superior margin of the right frontal resection cavity, suspicious for possible locally recurrent disease. No significant mass effect."He followed upwith his neurosurgeon, Dr. Kathyrn Sheriff, who recommended continued monitoring.  He was previously on topiramate 100mg  twice daily but discontinued because he could not afford it.He stopped Depakote because he said it made his headaches worse.   Past Medical History: Past Medical History:  Diagnosis Date  . Back injuries     Medications: Outpatient Encounter Medications as of 11/16/2018  Medication Sig  . amitriptyline (ELAVIL) 75 MG tablet Take 1 tablet (75 mg total) by mouth at bedtime.  . butalbital-acetaminophen-caffeine (FIORICET WITH CODEINE) 50-325-40-30 MG capsule 1 tab po q 6 hours as needed ha  . cyclobenzaprine (FLEXERIL) 10 MG tablet Take 1 tablet (10 mg total) by mouth 3 (three) times daily as needed for muscle spasms.  Marland Kitchen ibuprofen (ADVIL,MOTRIN) 200 MG tablet Take 200 mg by mouth every 6 (six) hours as needed.  . levETIRAcetam (KEPPRA) 750 MG tablet Take 1 tablet (750 mg total) by mouth 2 (two) times daily.  . SUMAtriptan (IMITREX) 100 MG tablet Take 1 tablet (100 mg total) by mouth once as needed for up to 1 dose for migraine. May repeat in 2 hours if headache persists or recurs. No more than 2 doses in 24 hours   No facility-administered encounter medications on file as of 11/16/2018.      Allergies: No Known Allergies  Family History: Family History  Problem Relation Age of Onset  . Breast cancer Maternal Grandmother   . Lung cancer Maternal Grandfather   . Brain cancer Neg Hx     Social History: Social History   Socioeconomic History  . Marital status: Single    Spouse name: Not on file  . Number of children: Not on file  . Years of education: some colleg  . Highest education level: Not on file  Occupational History  . Occupation: disabled  Social Needs  . Financial resource strain: Not on file  . Food insecurity:    Worry: Not on file    Inability: Not on file  . Transportation needs:    Medical: Not on file    Non-medical: Not on file  Tobacco Use  . Smoking status: Never Smoker  . Smokeless tobacco: Never Used  Substance and Sexual Activity  . Alcohol use: Yes    Comment: occasional  . Drug use: No  . Sexual activity: Not on file  Lifestyle  . Physical activity:    Days per week: Not on file    Minutes per session: Not on file  . Stress: Not on file  Relationships  . Social connections:    Talks on phone: Not on file    Gets  together: Not on file    Attends religious service: Not on file    Active member of club or organization: Not on file    Attends meetings of clubs or organizations: Not on file    Relationship status: Not on file  . Intimate partner violence:    Fear of current or ex partner: Not on file    Emotionally abused: Not on file    Physically abused: Not on file    Forced sexual activity: Not on file  Other Topics Concern  . Not on file  Social History Narrative   Lives with mother, in 2 story home, no pets. 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   Observations/Objective:   Height 6\' 2"  (1.88 m), weight (!) 354 lb (160.6 kg). No acute distress.  Alert and oriented. Speech fluent and not dysarthric.    Assessment and Plan:   1.  DNET 2.  Chronic migraine without aura, without status migrainosus, not  intractable 3.  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  Now that he has insurance, we will restart topiramate, titrating to 100mg  twice daily (as both migraine and seizure prophylaxis) 2.  He will taper off amitriptyline over the next week (50mg  twice daily for 3 days, then 50mg  at bedtime for 3 days, then stop) 3.  Continue Keppra 750mg  twice daily 4.  Sumatriptan for abortive migraine therapy.  Limit use of pain relievers to no more than 2 days out of week to prevent risk of rebound or medication-overuse headache. 5.  Follow up in 3 months.  Follow Up Instructions:    -I discussed the assessment and treatment plan with the patient. The patient was provided an opportunity to ask questions and all were answered. The patient agreed with the plan and demonstrated an understanding of the instructions.   The patient was advised to call back or seek an in-person evaluation if the symptoms worsen or if the condition fails to improve as anticipated.   Dudley Major, DO

## 2018-11-16 ENCOUNTER — Other Ambulatory Visit: Payer: Self-pay

## 2018-11-16 ENCOUNTER — Encounter: Payer: Self-pay | Admitting: Neurology

## 2018-11-16 ENCOUNTER — Telehealth (INDEPENDENT_AMBULATORY_CARE_PROVIDER_SITE_OTHER): Payer: Medicare Other | Admitting: Neurology

## 2018-11-16 VITALS — Ht 74.0 in | Wt 354.0 lb

## 2018-11-16 DIAGNOSIS — G43709 Chronic migraine without aura, not intractable, without status migrainosus: Secondary | ICD-10-CM

## 2018-11-16 DIAGNOSIS — D332 Benign neoplasm of brain, unspecified: Secondary | ICD-10-CM

## 2018-11-16 DIAGNOSIS — G40209 Localization-related (focal) (partial) symptomatic epilepsy and epileptic syndromes with complex partial seizures, not intractable, without status epilepticus: Secondary | ICD-10-CM

## 2018-11-16 MED ORDER — TOPIRAMATE 50 MG PO TABS: ORAL_TABLET | ORAL | 0 refills | Status: DC

## 2018-11-18 ENCOUNTER — Other Ambulatory Visit: Payer: Self-pay | Admitting: Radiation Therapy

## 2018-11-21 ENCOUNTER — Inpatient Hospital Stay: Payer: Medicare Other | Attending: Neurosurgery

## 2018-12-05 ENCOUNTER — Ambulatory Visit: Payer: Medicaid - Out of State | Admitting: Neurology

## 2018-12-23 ENCOUNTER — Other Ambulatory Visit: Payer: Self-pay | Admitting: Neurosurgery

## 2018-12-23 DIAGNOSIS — D332 Benign neoplasm of brain, unspecified: Secondary | ICD-10-CM

## 2019-01-16 ENCOUNTER — Other Ambulatory Visit: Payer: Self-pay | Admitting: Radiation Therapy

## 2019-01-19 ENCOUNTER — Telehealth: Payer: Self-pay | Admitting: Neurology

## 2019-01-19 ENCOUNTER — Other Ambulatory Visit: Payer: Self-pay

## 2019-01-19 ENCOUNTER — Ambulatory Visit
Admission: RE | Admit: 2019-01-19 | Discharge: 2019-01-19 | Disposition: A | Discharge status: Home / Self Care | Payer: Medicare Other | Source: Ambulatory Visit | Attending: Neurosurgery | Admitting: Neurosurgery

## 2019-01-19 DIAGNOSIS — D332 Benign neoplasm of brain, unspecified: Secondary | ICD-10-CM

## 2019-01-19 MED ORDER — GADOBENATE DIMEGLUMINE 529 MG/ML IV SOLN
20.0000 mL | Freq: Once | INTRAVENOUS | Status: AC | PRN
  Administered 2019-01-19: 20 mL via INTRAVENOUS

## 2019-01-19 NOTE — Telephone Encounter (Signed)
Pt needs to talk someone about getting a letter about his seizures please call

## 2019-01-19 NOTE — Telephone Encounter (Signed)
Called and spoke with Pt. He said he contacted the wrong office. He is to have a home health agency come to his home and sit with him because of his seizures, but our office did not set this up. He has contact the correct office.  Pt will call us back if needed.

## 2019-01-23 ENCOUNTER — Inpatient Hospital Stay: Payer: Medicare Other | Attending: Neurosurgery

## 2019-01-25 ENCOUNTER — Telehealth: Payer: Self-pay | Admitting: Neurology

## 2019-01-25 NOTE — Telephone Encounter (Signed)
Patient is calling in about paperwork for a nurse to come stay with him from seizures. Please call him back. Thanks!

## 2019-01-27 NOTE — Telephone Encounter (Signed)
Pt has already called about this on 01/19/19.  We are not the office who was setting up the home health agency who was to come to his house. Called Pt, no answer. His VM is full, unable to LM

## 2019-01-30 NOTE — Telephone Encounter (Signed)
Called and spoke with Pt about message. He said he needs a letter stating he has seizures to send to a home health agency his mom has found that is willing to help him.  He is going to send the information where to fax the information via My Chart.

## 2019-01-31 NOTE — Telephone Encounter (Signed)
PATIENT HAS BEEN CALLING FOR 3 WEEKS AND ALSO STOPPED BY TRYING TO GET A LETTER SO HE CAN GET A HOME NURSE. THIS NEEDS TO BE FAXED TODAY TO [phone]. AND HE NEEDS A CONFIRMATION CALL THAT THIS WAS COMPLETED. THANKS!

## 2019-01-31 NOTE — Telephone Encounter (Signed)
Called Pt to advise I did receive the fax number via My Chart yesterday afternoon as we discussed. I advised Pt that Dr. Tomi Likens returned to the office today and has signed the letter, and it has now been faxed.  I advised him I am mailing the original to him as well.

## 2019-02-09 ENCOUNTER — Telehealth: Payer: Self-pay

## 2019-02-09 ENCOUNTER — Telehealth: Payer: Self-pay | Admitting: Neurology

## 2019-02-09 NOTE — Telephone Encounter (Signed)
Patient is needing the [employer] form- fax#: [phone] ATTN: Durene Cal. Patient is also wanting a call from a nurse. Thanks!

## 2019-02-09 NOTE — Telephone Encounter (Signed)
Patient needs a [employer] form completed can you advise what this is .

## 2019-02-10 NOTE — Telephone Encounter (Signed)
I contacted patient an asked patient to call for further instructions on that form

## 2019-02-10 NOTE — Telephone Encounter (Signed)
We have supposedly sent a letter (it is in the chart) stating that he needs accommodations/or an aide to assist him at home due to seizure disorder.  That is all I know.

## 2019-02-14 NOTE — Telephone Encounter (Signed)
I don't know anything about this form.  It appears that we never received any form.  He initially asked for a letter requesting accommodations and we provided that for him.  If he wishes to make a virtual visit, then that would be fine.

## 2019-02-20 NOTE — Progress Notes (Signed)
NEUROLOGY FOLLOW UP OFFICE NOTE  Donald Mayer IQ:[id number]  HISTORY OF PRESENT ILLNESS: Donald Mayer is a 33 year old male who follows up for migraines, black out spells, and DNET.  UPDATE: Current medications: topiramate 100mg  twice daily for migraine prophylaxis, sumatriptan 100mg  or Motrin for migraine abortive therapy.  Still with daily headaches.  He reports migraine every other day to everyday.  He takes Motrin twice a week.  He ran out of sumatriptan.    Since last visit, he has had one black out spell.  He is irritable and has broken his phone in frustration. He is off of Keppra since starting the topiramate.  He has been off of topiramate for a week due to running out.  HISTORY: On 04/28/16, he had 2 episodes of syncope. Afterwards, he developed a mild headache. CT of head from 04/30/16 revealed area of low attenuation in the right frontotemporal region. 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 He underwent a frontal craniotomy on 05/04/16 for resection of the tumor. Postoperative MRI revealed postsurgical changes but no residual tumor. 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 EEG at that time was normal. He was placed on Keppra as a prophylactic  Migraines started around this time. They are right frontal, throbbing, 9-10/10 intensity and associated with blurred vision in right eye, nausea, photophobia, and phonophobia. There is no preceding aura. They last until he goes to sleep and occurs daily. They are triggered or aggravated by light and sound and are relieved by sleep. He has tried ibuprofen (takes daily) and Fioricet. He is often unable to function.   He had another MRI of brain without contrast on 09/17/16, which revealed low level T2 and FLAIR signal around the region of resection which could represent sequelae of  previous surgery, but could not exclude recurrent tumor. He followed up with neurosurgery and is monitoring for now. To further assess syncope, he had a 24 hour holter monitor in June 2018, which was unremarkable.  In October 2018, he started having black-out spells again. Sometimes, it occurs when he is standing on his feet, such as in the shower or walking down the stairs. He feels lightheaded and then passes out. However, he also reports episodes while just sitting or reclining on the couch or bed. He has no preceding warning. He wakes up and notes a loss of time. Family members have not reported any noticeable seizure-like activity if he is sleeping or unconscious on the couch. It occurs once in a while. EEG from 07/07/17 was normal. He had a repeat MRI of brain with and without contrast on 08/11/17, which was personally reviewed and showed slightly progressed T2/FLAIR signal abnormality along the superior margin of the right frontal resection cavity, suspicious for possible locally recurrent disease. No significant mass effect.He followed upwith his neurosurgeon, Dr. Kathyrn Sheriff, who recommended continued monitoring.  Repeat MRI of brain without contrast on 10/24/18 was personally reviewed and showed "minimal progression of T2/FLAIR signal abnormality along the margins of the right frontal resection cavity which may reflect recurrent tumor."  Contrast was not administered due to inability to obtain IV access.  Plan is to try and resect the recurrent tumor within the next 6 months but would like to get the seizures under control first.  Repeat MRI of brain without contrast on 10/24/18 was personally reviewed and showed "minimal  progression of T2/FLAIR signal abnormality along the margins of the right frontal resection cavity which may reflect recurrent tumor."  Contrast was not administered due to inability to obtain IV access.  Plan is to try and resect the recurrent tumor within the next 6  months but would like to get the seizures under control first.  Past medications:  Depakote (worsened headaches), Keppra 750mg  twice daily; amitriptyline  PAST MEDICAL HISTORY: Past Medical History:  Diagnosis Date   Back injuries    Seizure [facility])     MEDICATIONS: Current Outpatient Medications on File Prior to Visit  Medication Sig Dispense Refill   butalbital-acetaminophen-caffeine (FIORICET WITH CODEINE) 50-325-40-30 MG capsule 1 tab po q 6 hours as needed ha 12 capsule 0   fenofibrate (TRICOR) 145 MG tablet Take 145 mg by mouth daily.     levETIRAcetam (KEPPRA) 750 MG tablet Take 1 tablet (750 mg total) by mouth 2 (two) times daily. 60 tablet 3   SUMAtriptan (IMITREX) 100 MG tablet Take 1 tablet (100 mg total) by mouth once as needed for up to 1 dose for migraine. May repeat in 2 hours if headache persists or recurs. No more than 2 doses in 24 hours 10 tablet 4   topiramate (TOPAMAX) 50 MG tablet Take 1 tablet at bedtime for 7 days, then 1 tablet twice daily for 7 days, then 2 tablets twice daily 120 tablet 0   No current facility-administered medications on file prior to visit.     ALLERGIES: Allergies  Allergen Reactions   Multihance [Gadobenate] Nausea And Vomiting   Contrast Media [Iodinated Diagnostic Agents]     FAMILY HISTORY: Family History  Problem Relation Age of Onset   Breast cancer Maternal Grandmother    Lung cancer Maternal Grandfather    Brain cancer Neg Hx     SOCIAL HISTORY: Social History   Socioeconomic History   Marital status: Single    Spouse name: Not on file   Number of children: Not on file   Years of education: some colleg   Highest education level: Not on file  Occupational History   Occupation: disabled  Social Designer, fashion/clothing strain: Not on file   Food insecurity    Worry: Not on file    Inability: Not on file   Transportation needs    Medical: Not on file    Non-medical: Not on file  Tobacco  Use   Smoking status: Never Smoker   Smokeless tobacco: Never Used  Substance and Sexual Activity   Alcohol use: Yes    Comment: occasional   Drug use: No   Sexual activity: Not on file  Lifestyle   Physical activity    Days per week: Not on file    Minutes per session: Not on file   Stress: Not on file  Relationships   Social connections    Talks on phone: Not on file    Gets together: Not on file    Attends religious service: Not on file    Active member of club or organization: Not on file    Attends meetings of clubs or organizations: Not on file    Relationship status: Not on file   Intimate partner violence    Fear of current or ex partner: Not on file    Emotionally abused: Not on file    Physically abused: Not on file    Forced sexual activity: Not on file  Other Topics Concern   Not on file  Social History Narrative   Lives with mother, in 2 story home, no pets. 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    REVIEW OF SYSTEMS: Constitutional: No fevers, chills, or sweats, no generalized fatigue, change in appetite Eyes: No visual changes, double vision, eye pain Ear, nose and throat: No hearing loss, ear pain, nasal congestion, sore throat Cardiovascular: No chest pain, palpitations Respiratory:  No shortness of breath at rest or with exertion, wheezes GastrointestinaI: No nausea, vomiting, diarrhea, abdominal pain, fecal incontinence Genitourinary:  No dysuria, urinary retention or frequency Musculoskeletal:  No neck pain, back pain Integumentary: No rash, pruritus, skin lesions Neurological: as above Psychiatric: No depression, insomnia, anxiety Endocrine: No palpitations, fatigue, diaphoresis, mood swings, change in appetite, change in weight, increased thirst Hematologic/Lymphatic:  No purpura, petechiae. Allergic/Immunologic: no itchy/runny eyes, nasal congestion, recent allergic reactions, rashes  PHYSICAL EXAM: Blood pressure 123/83, pulse  88, temperature 98.1 F (36.7 C), height 6\' 2"  (1.88 m), weight (!) 337 lb (152.9 kg), SpO2 98 %. General: No acute distress.  Patient appears well-groomed.   Head:  Normocephalic/atraumatic Eyes:  Fundi examined but not visualized Neck: supple, no paraspinal tenderness, full range of motion Heart:  Regular rate and rhythm Lungs:  Clear to auscultation bilaterally Back: No paraspinal tenderness Neurological Exam: alert and oriented to person, place, and time. Attention span and concentration intact, recent and remote memory intact, fund of knowledge intact.  Speech fluent and not dysarthric, language intact.  CN II-XII intact. Bulk and tone normal, muscle strength 5/5 throughout.  Sensation to light touch intact.  Deep tendon reflexes 2+ throughout, toes downgoing.  Finger to nose and heel to shin testing intact.  Gait normal, Romberg negative.  IMPRESSION: 1.  DNET 2.  Chronic migraine without aura, without status migrainosus, not intractable 3.  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  Irritability despite off of Keppra  PLAN: 1.  To help further reduce frequency of blackouts, increase topiramate to 150mg  twice daily. 2.  To help with migraine prevention, start propranolol 40mg  twice daily (advised to monitor for lightheadedness).  We can increase dose in 4 weeks if needed. 3.  Sumatriptan refilled for migraine abortive therapy. 4.  Limit use of pain relievers to no more than 2 days out of week to prevent risk of rebound or medication-overuse headache. 5.  Keep headache diary 6.  Follow up with neurosurgery 7.  Discuss irritability with PCP for management. 8.  No driving 9.  Follow up with me in 4 months.  Metta Clines, DO   CC:  Mackie Pai, PA-C

## 2019-02-21 ENCOUNTER — Ambulatory Visit (INDEPENDENT_AMBULATORY_CARE_PROVIDER_SITE_OTHER): Payer: Medicare Other | Admitting: Neurology

## 2019-02-21 ENCOUNTER — Encounter: Payer: Self-pay | Admitting: Neurology

## 2019-02-21 ENCOUNTER — Other Ambulatory Visit: Payer: Self-pay

## 2019-02-21 ENCOUNTER — Other Ambulatory Visit: Payer: Self-pay | Admitting: Neurology

## 2019-02-21 VITALS — BP 123/83 | HR 88 | Temp 98.1°F | Ht 74.0 in | Wt 337.0 lb

## 2019-02-21 DIAGNOSIS — D332 Benign neoplasm of brain, unspecified: Secondary | ICD-10-CM

## 2019-02-21 DIAGNOSIS — G40209 Localization-related (focal) (partial) symptomatic epilepsy and epileptic syndromes with complex partial seizures, not intractable, without status epilepticus: Secondary | ICD-10-CM | POA: Diagnosis not present

## 2019-02-21 DIAGNOSIS — R454 Irritability and anger: Secondary | ICD-10-CM

## 2019-02-21 DIAGNOSIS — G43709 Chronic migraine without aura, not intractable, without status migrainosus: Secondary | ICD-10-CM | POA: Diagnosis not present

## 2019-02-21 MED ORDER — SUMATRIPTAN SUCCINATE 100 MG PO TABS: 100.0000 mg | ORAL_TABLET | Freq: Once | ORAL | 4 refills | Status: DC | PRN

## 2019-02-21 MED ORDER — PROPRANOLOL HCL 40 MG PO TABS: 40.0000 mg | ORAL_TABLET | Freq: Two times a day (BID) | ORAL | 3 refills | Status: DC

## 2019-02-21 MED ORDER — TOPIRAMATE 100 MG PO TABS: 150.0000 mg | ORAL_TABLET | Freq: Two times a day (BID) | ORAL | 3 refills | Status: DC

## 2019-02-21 NOTE — Telephone Encounter (Signed)
Patient mother would like to speak to someone about today appt and the medication

## 2019-02-21 NOTE — Telephone Encounter (Signed)
Patient's imitrex script from office appt today faxed to [facility]. Left voice mail with patient regarding fax. Fax confirmation received.

## 2019-02-21 NOTE — Patient Instructions (Signed)
1.  We will increase topiramate 100mg  tablet.  Take 1 1/2 tablets (150mg  total) twice daily 2.  To help reduce frequency of headaches, start propranolol 40mg  twice daily.  Contact me in 4 weeks with update and we can increase dose if needed. 3.  At earliest onset of migraine, take sumatriptan 100mg .  May repeat dose after 2 hours if needed (maximum 2 tablets in 24 hours) 4.  Limit use of pain relievers to no more than 2 days out of week to prevent risk of rebound or medication-overuse headache. 5.  Keep headache diary 6.  No driving given your history of ongoing black outs 7.  Follow up with Dr. Casimer Bilis 8.  Follow up in 4 months.

## 2019-03-24 ENCOUNTER — Encounter: Payer: Self-pay | Admitting: Neurology

## 2019-03-27 ENCOUNTER — Other Ambulatory Visit: Payer: Self-pay | Admitting: Neurosurgery

## 2019-03-29 ENCOUNTER — Other Ambulatory Visit: Payer: Self-pay | Admitting: Neurosurgery

## 2019-03-29 DIAGNOSIS — D332 Benign neoplasm of brain, unspecified: Secondary | ICD-10-CM

## 2019-04-17 ENCOUNTER — Other Ambulatory Visit: Payer: Medicare Other

## 2019-05-08 ENCOUNTER — Other Ambulatory Visit: Payer: Self-pay

## 2019-05-08 ENCOUNTER — Ambulatory Visit
Admission: RE | Admit: 2019-05-08 | Discharge: 2019-05-08 | Disposition: A | Discharge status: Home / Self Care | Payer: Medicare Other | Source: Ambulatory Visit | Attending: Neurosurgery | Admitting: Neurosurgery

## 2019-05-08 DIAGNOSIS — D332 Benign neoplasm of brain, unspecified: Secondary | ICD-10-CM

## 2019-05-08 MED ORDER — GADOBUTROL 1 MMOL/ML IV SOLN
10.0000 mL | Freq: Once | INTRAVENOUS | Status: AC | PRN
  Administered 2019-05-08: 10 mL via INTRAVENOUS

## 2019-05-15 ENCOUNTER — Other Ambulatory Visit: Payer: Self-pay | Admitting: Radiation Therapy

## 2019-06-12 ENCOUNTER — Encounter: Payer: Self-pay | Admitting: Neurology

## 2019-06-23 ENCOUNTER — Ambulatory Visit: Payer: Medicare Other | Admitting: Neurology

## 2019-06-23 ENCOUNTER — Encounter: Payer: Self-pay | Admitting: Neurology

## 2019-06-23 NOTE — Progress Notes (Addendum)
Virtual Visit via Video Note The purpose of this virtual visit is to provide medical care while limiting exposure to the novel coronavirus.    Consent was obtained for video visit:  Yes.   Answered questions that patient had about telehealth interaction:  Yes.   I discussed the limitations, risks, security and privacy concerns of performing an evaluation and management service by telemedicine. I also discussed with the patient that there may be a patient responsible charge related to this service. The patient expressed understanding and agreed to proceed.  Pt location: Home Physician Location: office Name of referring provider:  Saguier, Percell Miller, PA-C I connected with Pablo A Renfro at patients initiation/request on 06/26/2019 at 11:30 AM EST by video enabled telemedicine application and verified that I am speaking with the correct person using two identifiers. Pt MRN:  [id number] Pt DOB:  1985/10/31 Video Participants:  Kervens A Antigua   History of Present Illness:  Donald Mayer is a 34 year old male who follows up for migraines, black out spells, and DNET.  UPDATE: Current medications: none  He is currently not on any medications. He is having moderate to severe headache every other day.  Headaches last several hours.  If he takes sumatriptan, he goes to sleep and headache is gone when he wakes up.  He thinks he has a seizure maybe once a month.  He says it seems to occur in his sleep because he would wake up confused. Scheduled for repeat MRI of brain by his neurosurgeon in March. He currently takes a shower but is afraid of having a seizure and falling and hitting his head.  He would rather take a bath.  HISTORY: On 04/28/16, he had 2 episodes of syncope. Afterwards, he developed a mild headache. CT of head from 04/30/16 revealed area of low attenuation in the right frontotemporal region. 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 He underwent a frontal craniotomy on 05/04/16 for resection of the tumor. Postoperative MRI revealed postsurgical changes but no residual tumor. 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 EEG at that time was normal. Hewas placedon Keppra as a prophylactic  Migraines started around this time. They are right frontal, throbbing, 9-10/10 intensity and associated with blurred vision in right eye, nausea, photophobia, and phonophobia. There is no preceding aura. They last until he goes to sleep and occurs daily. They are triggered or aggravated by light and sound and are relieved by sleep. He has tried ibuprofen (takes daily) and Fioricet. He is often unable to function.   He had another MRI of brain without contrast on 09/17/16, which revealed low level T2 and FLAIR signal around the region of resection which could represent sequelae of previous surgery, but could not exclude recurrent tumor. He followed up with neurosurgery and is monitoring for now. To further assess syncope, he had a 24 hour holter monitor in June 2018, which was unremarkable.  In October 2018, he started having black-out spells again. Sometimes, it occurs when he is standing on his feet, such as in the shower or walking down the stairs. He feels lightheaded and then passes out. However, he also reports episodes while just sitting or reclining on the couch or bed. He has no preceding warning. He wakes up and notes a loss of time. Family members have not reported any noticeable seizure-like activity if  he is sleeping or unconscious on the couch. It occurs once in a while. EEG from 07/07/17 was normal. He had a repeat MRI of brain with and without contrast on 08/11/17, which was personally reviewed and showed "slightly progressed T2/FLAIR signal abnormality along the superior margin of the right frontal resection cavity,  suspicious for possible locally recurrent disease. No significant mass effect."He followed upwith his neurosurgeon, Dr. Kathyrn Sheriff, who recommended continued monitoring.  Repeat MRI of brain without contrast on 10/24/18 was personally reviewed and showed "minimal progression of T2/FLAIR signal abnormality along the margins of the right frontal resection cavity which may reflect recurrent tumor." Contrast was not administered due to inability to obtain IV access. Plan is to try and resect the recurrent tumor within the next 6 months but would like to get the seizures under control first.  Repeat MRI of brain without contrast on 10/24/18 was personally reviewed and showed "minimal progression of T2/FLAIR signal abnormality along the margins of the right frontal resection cavity which may reflect recurrent tumor." Contrast was not administered due to inability to obtain IV access. Plan is to try and resect the recurrent tumor within the next 6 months but would like to get the seizures under control first.  Past medications:  Depakote (worsened headaches), Keppra 750mg  twice daily (irritability); amitriptyline; propranolol 40mg  twice daily (increased migraines)  Past Medical History: Past Medical History:  Diagnosis Date  . Back injuries   . Seizure [facility])     Medications: Outpatient Encounter Medications as of 06/26/2019  Medication Sig  . propranolol (INDERAL) 40 MG tablet Take 1 tablet (40 mg total) by mouth 2 (two) times daily.  . SUMAtriptan (IMITREX) 100 MG tablet Take 1 tablet (100 mg total) by mouth once as needed for up to 1 dose for migraine. May repeat in 2 hours if headache persists or recurs. No more than 2 doses in 24 hours  . topiramate (TOPAMAX) 100 MG tablet Take 1.5 tablets (150 mg total) by mouth 2 (two) times daily.   No facility-administered encounter medications on file as of 06/26/2019.    Allergies: Allergies  Allergen Reactions  . Multihance [Gadobenate] Nausea And  Vomiting  . Contrast Media [Iodinated Diagnostic Agents]     Family History: Family History  Problem Relation Age of Onset  . Breast cancer Maternal Grandmother   . Lung cancer Maternal Grandfather   . Brain cancer Neg Hx     Social History: Social History   Socioeconomic History  . Marital status: Single    Spouse name: Not on file  . Number of children: Not on file  . Years of education: some colleg  . Highest education level: Not on file  Occupational History  . Occupation: disabled  Tobacco Use  . Smoking status: Never Smoker  . Smokeless tobacco: Never Used  Substance and Sexual Activity  . Alcohol use: Yes    Comment: occasional  . Drug use: No  . Sexual activity: Not on file  Other Topics Concern  . Not on file  Social History Narrative   Lives with mother, in 2 story home, no pets. 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 Difficulty of Paying Living Expenses: Not on file  Food Insecurity:   . Worried About Charity fundraiser in the Last Year: Not on file  . Ran Out of Food in  the Last Year: Not on file  Transportation Needs:   . Lack of Transportation (Medical): Not on file  . Lack of Transportation (Non-Medical): Not on file  Physical Activity:   . Days of Exercise per Week: Not on file  . Minutes of Exercise per Session: Not on file  Stress:   . Feeling of Stress : Not on file  Social Connections:   . Frequency of Communication with Friends and Family: Not on file  . Frequency of Social Gatherings with Friends and Family: Not on file  . Attends Religious Services: Not on file  . Active Member of Clubs or Organizations: Not on file  . Attends Archivist Meetings: Not on file  . Marital Status: Not on file  Intimate Partner Violence:   . Fear of Current or Ex-Partner: Not on file  . Emotionally Abused: Not on  file  . Physically Abused: Not on file  . Sexually Abused: Not on file    Observations/Objective:   Height 6\' 2"  (1.88 m), weight (!) 335 lb (152 kg). No acute distress.  Alert and oriented.  Speech fluent and not dysarthric.  Language intact.  Eyes orthophoric on primary gaze.  Face symmetric.  Assessment and Plan:   1.  DNET 2.  Chronic migraine without aura, without status migrainosus, not intractable 3.  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  Start Aimovig 70mg  every 30 days for migraine prophylaxis 2.  Restart topiramate, titrating to 150mg  twice daily 3.  Refill sumatriptan 100mg  for migraine abortive therapy 4.  Limit use of pain relievers to no more than 2 days out of week to prevent risk of rebound or medication-overuse headache. 5.  Keep headache diary 6.  Will order a shower seat to minimize fall risk.  With history of ongoing seizures, he should not be alone in a bath. 7.  No driving 8.  Follow up in 4 months.  Follow Up Instructions:    -I discussed the assessment and treatment plan with the patient. The patient was provided an opportunity to ask questions and all were answered. The patient agreed with the plan and demonstrated an understanding of the instructions.   The patient was advised to call back or seek an in-person evaluation if the symptoms worsen or if the condition fails to improve as anticipated.    Dudley Major, DO

## 2019-06-26 ENCOUNTER — Telehealth (INDEPENDENT_AMBULATORY_CARE_PROVIDER_SITE_OTHER): Payer: Medicare Other | Admitting: Neurology

## 2019-06-26 ENCOUNTER — Other Ambulatory Visit: Payer: Self-pay

## 2019-06-26 VITALS — Ht 74.0 in | Wt 335.0 lb

## 2019-06-26 DIAGNOSIS — D332 Benign neoplasm of brain, unspecified: Secondary | ICD-10-CM

## 2019-06-26 DIAGNOSIS — R531 Weakness: Secondary | ICD-10-CM

## 2019-06-26 DIAGNOSIS — G40209 Localization-related (focal) (partial) symptomatic epilepsy and epileptic syndromes with complex partial seizures, not intractable, without status epilepticus: Secondary | ICD-10-CM

## 2019-06-26 DIAGNOSIS — G43709 Chronic migraine without aura, not intractable, without status migrainosus: Secondary | ICD-10-CM | POA: Diagnosis not present

## 2019-06-26 MED ORDER — TOPIRAMATE 50 MG PO TABS: ORAL_TABLET | ORAL | 0 refills | Status: DC

## 2019-06-26 MED ORDER — AIMOVIG 70 MG/ML ~~LOC~~ SOAJ: 70.0000 mg | SUBCUTANEOUS | 11 refills | Status: DC

## 2019-06-26 MED ORDER — SUMATRIPTAN SUCCINATE 100 MG PO TABS: ORAL_TABLET | ORAL | 2 refills | Status: DC

## 2019-06-30 NOTE — Progress Notes (Unsigned)
Aimovig 70mg /ML sent to plan for approval key Number [id number].Rx number [id number].Awaiting demermination from the plan. Sent to scan

## 2019-07-03 ENCOUNTER — Telehealth: Payer: Self-pay

## 2019-07-03 NOTE — Progress Notes (Signed)
Approved 3 months 06/30/2019 until 09/29/2019 ID [id number]  [employer]  aimovig 70mg /ml [id number] Ticket [id number]

## 2019-07-03 NOTE — Telephone Encounter (Signed)
aimovig 70mg /ml approved

## 2019-08-08 ENCOUNTER — Other Ambulatory Visit: Payer: Self-pay | Admitting: Neurosurgery

## 2019-08-08 DIAGNOSIS — D332 Benign neoplasm of brain, unspecified: Secondary | ICD-10-CM

## 2019-09-20 ENCOUNTER — Other Ambulatory Visit: Payer: Medicare Other

## 2019-10-18 ENCOUNTER — Other Ambulatory Visit: Payer: Medicare Other

## 2019-10-24 NOTE — Progress Notes (Signed)
Virtual Visit via Video Note The purpose of this virtual visit is to provide medical care while limiting exposure to the novel coronavirus.    Consent was obtained for video visit:  Yes.   Answered questions that patient had about telehealth interaction:  Yes.   I discussed the limitations, risks, security and privacy concerns of performing an evaluation and management service by telemedicine. I also discussed with the patient that there may be a patient responsible charge related to this service. The patient expressed understanding and agreed to proceed.  Pt location: Home Physician Location: office Name of referring provider:  Saguier, Percell Miller, PA-C I connected with Donald Mayer at patients initiation/request on 10/26/2019 at  2:30 PM EDT by video enabled telemedicine application and verified that I am speaking with the correct person using two identifiers. Pt MRN:  [id number] Pt DOB:  1986/01/19 Video Participants:  Donald Mayer   History of Present Illness:  Donald Mayer is a 34 year old male who follows up for migraines, black out spells, and DNET.  UPDATE: Current medications: none  He was never able to pick up the Aimovig and topiramate due to insurance issues.  He currently has Medicare in [place] but Medicaid only in [place], which he is trying to fix.    Only 1 blackout since January Due to overuse of ibuprofen, he had some renal dysfunction and was advised to stop.   Due to insurance problems, he was unable to get the repeat MRI of the brain with neurosurgery. He reports increased falls.   HISTORY: On 04/28/16, he had 2 episodes of syncope. Afterwards, he developed a mild headache. CT of head from 04/30/16 revealed area of low attenuation in the right frontotemporal region. 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 He  underwent a frontal craniotomy on 05/04/16 for resection of the tumor. Postoperative MRI revealed postsurgical changes but no residual tumor. 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 EEG at that time was normal. Hewas placedon Keppra as a prophylactic  Migraines started around this time. They are right frontal, throbbing, 9-10/10 intensity and associated withblurred vision in right eye,nausea, photophobia, and phonophobia. There is no preceding aura. They last until he goes to sleep and occurs daily. They are triggered or aggravated by light and sound and are relieved by sleep. He has tried ibuprofen (takes daily) and Fioricet. He is often unable to function.   He had another MRI of brain without contrast on 09/17/16, which revealed low level T2 and FLAIR signal around the region of resection which could represent sequelae of previous surgery, but could not exclude recurrent tumor. He followed up with neurosurgery and is monitoring for now. To further assess syncope, he had a 24 hour holter monitor in June 2018, which was unremarkable.  In October 2018, he started having black-out spells again. Sometimes, it occurs when he is standing on his feet, such as in the shower or walking down the stairs. He feels lightheaded and then passes out. However, he also reports episodes while just sitting or reclining on the couch or bed. He has no preceding warning. He wakes up and notes a loss of time. Family members have not reported any noticeable seizure-like activity if he is sleeping or unconscious on the couch. It occurs once in a while. EEG from 07/07/17 was normal. He had a repeat  MRI of brain with and without contrast on 08/11/17, which was personally reviewed and showed "slightly progressed T2/FLAIR signal abnormality along the superior margin of the right frontal resection cavity, suspicious for possible locally recurrent disease. No significant mass effect."He followed upwith his  neurosurgeon, Dr. Kathyrn Sheriff, who recommended continued monitoring. Repeat MRI of brain without contrast on 10/24/18 was personally reviewed and showed "minimal progression of T2/FLAIR signal abnormality along the margins of the right frontal resection cavity which may reflect recurrent tumor." Contrast was not administered due to inability to obtain IV access. Plan is to try and resect the recurrent tumor within the next 6 months but would like to get the seizures under control first.  Repeat MRI of brain without contrast on 10/24/18 was personally reviewed and showed "minimal progression of T2/FLAIR signal abnormality along the margins of the right frontal resection cavity which may reflect recurrent tumor." Contrast was not administered due to inability to obtain IV access. Plan is to try and resect the recurrent tumor within the next 6 months but would like to get the seizures under control first.  Past medications: Depakote (worsened headaches), Keppra 750mg  twice daily (irritability); amitriptyline; propranolol 40mg  twice daily (increased migraines)  Past Medical History: Past Medical History:  Diagnosis Date  . Back injuries   . Seizure [facility])     Medications: Outpatient Encounter Medications as of 10/26/2019  Medication Sig  . Erenumab-aooe (AIMOVIG) 70 MG/ML SOAJ Inject 70 mg into the skin every 30 (thirty) days.  . propranolol (INDERAL) 40 MG tablet Take 1 tablet (40 mg total) by mouth 2 (two) times daily.  . SUMAtriptan (IMITREX) 100 MG tablet Take 1 tablet earliest onset of migraine.  May repeat in 2 hours if headache persists or recurs.  Maximum 2 tablets in 24 hours.  . topiramate (TOPAMAX) 50 MG tablet Take 1/2 tablet twice daily for 1 week, then 1 tablet twice daily for 1 week, then 1.5 tablet twice daily for 1 week, then 2 tablets twice daily for 1 week, then 3 tablets twice daily.   No facility-administered encounter medications on file as of 10/26/2019.     Allergies: Allergies  Allergen Reactions  . Multihance [Gadobenate] Nausea And Vomiting  . Contrast Media [Iodinated Diagnostic Agents]     Family History: Family History  Problem Relation Age of Onset  . Breast cancer Maternal Grandmother   . Lung cancer Maternal Grandfather   . Brain cancer Neg Hx     Social History: Social History   Socioeconomic History  . Marital status: Single    Spouse name: Not on file  . Number of children: 0  . Years of education: some colleg  . Highest education level: Not on file  Occupational History  . Occupation: disabled  Tobacco Use  . Smoking status: Never Smoker  . Smokeless tobacco: Never Used  Substance and Sexual Activity  . Alcohol use: Yes    Comment: occasional  . Drug use: No  . Sexual activity: Not on file  Other Topics Concern  . Not on file  Social History Narrative   Lives with mother, in 2 story home, no pets. 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 Difficulty of Paying Living Expenses:   Food Insecurity:   . Worried About Charity fundraiser in the Last Year:   . Arboriculturist in  the Last Year:   Transportation Needs:   . Film/video editor (Medical):   Marland Kitchen Lack of Transportation (Non-Medical):   Physical Activity:   . Days of Exercise per Week:   . Minutes of Exercise per Session:   Stress:   . Feeling of Stress :   Social Connections:   . Frequency of Communication with Friends and Family:   . Frequency of Social Gatherings with Friends and Family:   . Attends Religious Services:   . Active Member of Clubs or Organizations:   . Attends Archivist Meetings:   Marland Kitchen Marital Status:   Intimate Partner Violence:   . Fear of Current or Ex-Partner:   . Emotionally Abused:   Marland Kitchen Physically Abused:   . Sexually Abused:     Observations/Objective:   Height 6\' 2"   (1.88 m), weight (!) 350 lb (158.8 kg). No acute distress.  Alert and oriented.  Speech fluent and not dysarthric.  Language intact.  Eyes orthophoric on primary gaze.  Face symmetric.  Assessment and Plan:   1.  DNET 2.  Chronic migraine without aura, without status migrainosus, not intractable 3.  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 4.  Falls, likely related to DNET.  1.  Restart topiramate titrating to 150mg  twice daily 2.  Refill sumatriptan 3.  He will contact me once he has Medicaid in [place] and I will prescribe Aimovig or Emgality. 4.  Limit use of pain relievers to no more than 2 days out of week to prevent risk of rebound or medication-overuse headache. 5.  Keep headache diary 6.  Advised to follow up with neurosurgery regarding his falls and follow up for DNET. 7.  Follow up in 4 months.  Follow Up Instructions:    -I discussed the assessment and treatment plan with the patient. The patient was provided an opportunity to ask questions and all were answered. The patient agreed with the plan and demonstrated an understanding of the instructions.   The patient was advised to call back or seek an in-person evaluation if the symptoms worsen or if the condition fails to improve as anticipated.    Dudley Major, DO

## 2019-10-26 ENCOUNTER — Telehealth (INDEPENDENT_AMBULATORY_CARE_PROVIDER_SITE_OTHER): Payer: Medicare Other | Admitting: Neurology

## 2019-10-26 ENCOUNTER — Encounter: Payer: Self-pay | Admitting: Neurology

## 2019-10-26 ENCOUNTER — Other Ambulatory Visit: Payer: Self-pay

## 2019-10-26 VITALS — Ht 74.0 in | Wt 350.0 lb

## 2019-10-26 DIAGNOSIS — G43709 Chronic migraine without aura, not intractable, without status migrainosus: Secondary | ICD-10-CM | POA: Diagnosis not present

## 2019-10-26 DIAGNOSIS — W19XXXA Unspecified fall, initial encounter: Secondary | ICD-10-CM

## 2019-10-26 DIAGNOSIS — D332 Benign neoplasm of brain, unspecified: Secondary | ICD-10-CM | POA: Diagnosis not present

## 2019-10-26 DIAGNOSIS — G40209 Localization-related (focal) (partial) symptomatic epilepsy and epileptic syndromes with complex partial seizures, not intractable, without status epilepticus: Secondary | ICD-10-CM | POA: Diagnosis not present

## 2019-10-26 MED ORDER — TOPIRAMATE 50 MG PO TABS: ORAL_TABLET | ORAL | 0 refills | Status: DC

## 2019-10-26 MED ORDER — SUMATRIPTAN SUCCINATE 100 MG PO TABS: ORAL_TABLET | ORAL | 2 refills | Status: DC

## 2019-12-05 ENCOUNTER — Other Ambulatory Visit: Payer: Self-pay | Admitting: Neurosurgery

## 2019-12-05 DIAGNOSIS — D332 Benign neoplasm of brain, unspecified: Secondary | ICD-10-CM

## 2020-01-11 ENCOUNTER — Other Ambulatory Visit: Payer: Medicare Other

## 2020-02-06 ENCOUNTER — Ambulatory Visit
Admission: RE | Admit: 2020-02-06 | Discharge: 2020-02-06 | Disposition: A | Discharge status: Home / Self Care | Payer: Medicare Other | Source: Ambulatory Visit | Attending: Neurosurgery | Admitting: Neurosurgery

## 2020-02-06 ENCOUNTER — Other Ambulatory Visit: Payer: Self-pay

## 2020-02-06 DIAGNOSIS — D332 Benign neoplasm of brain, unspecified: Secondary | ICD-10-CM

## 2020-02-06 MED ORDER — GADOBENATE DIMEGLUMINE 529 MG/ML IV SOLN
20.0000 mL | Freq: Once | INTRAVENOUS | Status: AC | PRN
  Administered 2020-02-06: 20 mL via INTRAVENOUS

## 2020-03-05 ENCOUNTER — Emergency Department (HOSPITAL_COMMUNITY): Payer: Medicare Other

## 2020-03-05 ENCOUNTER — Encounter (HOSPITAL_COMMUNITY): Payer: Self-pay

## 2020-03-05 ENCOUNTER — Other Ambulatory Visit: Payer: Self-pay

## 2020-03-05 ENCOUNTER — Emergency Department (HOSPITAL_COMMUNITY)
Admission: EM | Admit: 2020-03-05 | Discharge: 2020-03-05 | Disposition: A | Discharge status: Home / Self Care | Payer: Medicare Other | Attending: Emergency Medicine | Admitting: Emergency Medicine

## 2020-03-05 DIAGNOSIS — Z5321 Procedure and treatment not carried out due to patient leaving prior to being seen by health care provider: Secondary | ICD-10-CM | POA: Insufficient documentation

## 2020-03-05 DIAGNOSIS — G43909 Migraine, unspecified, not intractable, without status migrainosus: Secondary | ICD-10-CM | POA: Insufficient documentation

## 2020-03-05 NOTE — ED Triage Notes (Signed)
Pt reports migraine x 3 days.  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  Pt says feels like he has pressure on r side of head.  Pt says he knows the tumor has grown back.

## 2020-03-11 NOTE — Progress Notes (Signed)
Virtual Visit via Video Note The purpose of this virtual visit is to provide medical care while limiting exposure to the novel coronavirus.    Consent was obtained for video visit:  Yes.   Answered questions that patient had about telehealth interaction:  Yes.   I discussed the limitations, risks, security and privacy concerns of performing an evaluation and management service by telemedicine. I also discussed with the patient that there may be a patient responsible charge related to this service. The patient expressed understanding and agreed to proceed.  Pt location: Home Physician Location: office Name of referring provider:  Saguier, Percell Miller, PA-C I connected with Chriss A Brandenburg at patients initiation/request on 03/12/2020 at  3:30 PM EDT by video enabled telemedicine application and verified that I am speaking with the correct person using two identifiers. Pt MRN:  [id number] Pt DOB:  Apr 15, 1986 Video Participants:  Amous A Mccune   History of Present Illness:  Donald Mayer is a 34 year old male who follows up for migraines, black out spells, and DNET.  UPDATE: Current medications:topiramate 150mg , sumatriptan 100mg   I restarted him on topiramate in May.  He followed up with neurosurgery.  Reat MRI of brain with and without contrast on 02/06/2020 was personally reviewed and showed stable postoperative changes.  He reports frequent migraines lasting up to 3 to 4 days.  They are daily.  He takes sumatriptan or naproxen about 2 days a week.  He presented to the ED on 03/05/2020 for headache.  CT of head personally reviewed showed no acute intracranial abnormality.   He has not had recent seizures.     HISTORY: On 04/28/16, he had 2 episodes of syncope. Afterwards, he developed a mild headache. CT of head from 04/30/16 revealed area of low attenuation in the right frontotemporal region. 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 He underwent a frontal craniotomy on 05/04/16 for resection of the tumor. Postoperative MRI revealed postsurgical changes but no residual tumor. 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 EEG at that time was normal. Hewas placedon Keppra as a prophylactic  Migraines started around this time. They are right frontal, throbbing, 9-10/10 intensity and associated withblurred vision in right eye,nausea, photophobia, and phonophobia. There is no preceding aura. They last until he goes to sleep and occurs daily. They are triggered or aggravated by light and sound and are relieved by sleep. He has tried ibuprofen (takes daily) and Fioricet. He is often unable to function.   He had another MRI of brain without contrast on 09/17/16, which revealed low level T2 and FLAIR signal around the region of resection which could represent sequelae of previous surgery, but could not exclude recurrent tumor. He followed up with neurosurgery and is monitoring for now. To further assess syncope, he had a 24 hour holter monitor in June 2018, which was unremarkable.  In October 2018, he started having black-out spells again. Sometimes, it occurs when he is standing on his feet, such as in the shower or walking down the stairs. He feels lightheaded and then passes out. However, he also reports episodes while just sitting or reclining on the couch or bed. He has no preceding warning. He wakes up and notes a loss of time. Family members have not reported any noticeable seizure-like activity if he is sleeping or unconscious on the couch. It occurs  once in a while. EEG from 07/07/17 was normal. He had a repeat MRI of brain with and without contrast on 08/11/17, which was personally reviewed and showed "slightly progressed T2/FLAIR signal abnormality along the superior margin of the right frontal resection cavity, suspicious for  possible locally recurrent disease. No significant mass effect."He followed upwith his neurosurgeon, Dr. Kathyrn Sheriff, who recommended continued monitoring. Repeat MRI of brain without contrast on 10/24/18 was personally reviewed and showed "minimal progression of T2/FLAIR signal abnormality along the margins of the right frontal resection cavity which may reflect recurrent tumor." Contrast was not administered due to inability to obtain IV access. Plan is to try and resect the recurrent tumor within the next 6 months but would like to get the seizures under control first.  Repeat MRI of brain without contrast on 10/24/18 was personally reviewed and showed "minimal progression of T2/FLAIR signal abnormality along the margins of the right frontal resection cavity which may reflect recurrent tumor." Contrast was not administered due to inability to obtain IV access. Plan is to try and resect the recurrent tumor within the next 6 months but would like to get the seizures under control first.  Past medications: Depakote (worsened headaches), Keppra 750mg  twice daily(irritability); amitriptyline; propranolol 40mg  twice daily (increased migraines), Aimovig (unable to afford due to insurance)  Past Medical History: Past Medical History:  Diagnosis Date  . Back injuries   . Seizure [facility])     Medications: Outpatient Encounter Medications as of 03/12/2020  Medication Sig Note  . Erenumab-aooe (AIMOVIG) 70 MG/ML SOAJ Inject 70 mg into the skin every 30 (thirty) days. (Patient not taking: Reported on 10/26/2019) 10/26/2019: Unable to get due to insurance issues  . propranolol (INDERAL) 40 MG tablet Take 1 tablet (40 mg total) by mouth 2 (two) times daily. (Patient not taking: Reported on 10/26/2019) 10/26/2019: Unable to get due to insurance issues  . SUMAtriptan (IMITREX) 100 MG tablet Take 1 tablet earliest onset of migraine.  May repeat in 2 hours if headache persists or recurs.  Maximum 2 tablets in 24  hours.   . topiramate (TOPAMAX) 50 MG tablet Take 1/2 tablet twice daily for 1 week, then 1 tablet twice daily for 1 week, then 1.5 tablet twice daily for 1 week, then 2 tablets twice daily for 1 week, then 3 tablets twice daily.    No facility-administered encounter medications on file as of 03/12/2020.    Allergies: Allergies  Allergen Reactions  . Multihance [Gadobenate] Nausea And Vomiting  . Contrast Media [Iodinated Diagnostic Agents]     Family History: Family History  Problem Relation Age of Onset  . Breast cancer Maternal Grandmother   . Lung cancer Maternal Grandfather   . Brain cancer Neg Hx     Social History: Social History   Socioeconomic History  . Marital status: Single    Spouse name: Not on file  . Number of children: 0  . Years of education: some colleg  . Highest education level: Not on file  Occupational History  . Occupation: disabled  Tobacco Use  . Smoking status: Never Smoker  . Smokeless tobacco: Never Used  Vaping Use  . Vaping Use: Never used  Substance and Sexual Activity  . Alcohol use: Yes    Comment: occasional  . Drug use: No  . Sexual activity: Not on file  Other Topics Concern  . Not on file  Social History Narrative   Lives with mother, in 2 story home, no pets. 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   Left handed; some college; little caffeine; yes exercise   Social Determinants of Health   Financial Resource Strain:   . Difficulty of Paying Living Expenses: Not on file  Food Insecurity:   . Worried About Charity fundraiser in the Last Year: Not on file  . Ran Out of Food in the Last Year: Not on file  Transportation Needs:   . Lack of Transportation (Medical): Not on file  . Lack of Transportation (Non-Medical): Not on file  Physical Activity:   . Days of Exercise per Week: Not on file  . Minutes of Exercise per Session: Not on file  Stress:   . Feeling of Stress : Not on file  Social Connections:   .  Frequency of Communication with Friends and Family: Not on file  . Frequency of Social Gatherings with Friends and Family: Not on file  . Attends Religious Services: Not on file  . Active Member of Clubs or Organizations: Not on file  . Attends Archivist Meetings: Not on file  . Marital Status: Not on file  Intimate Partner Violence:   . Fear of Current or Ex-Partner: Not on file  . Emotionally Abused: Not on file  . Physically Abused: Not on file  . Sexually Abused: Not on file    Observations/Objective:   There were no vitals taken for this visit. No acute distress.  Alert and oriented.  Speech fluent and not dysarthric.  Language intact.  .  Assessment and Plan:   1.  DNET 2.  Chronic migraine without aura, without status migrainosus, not intractable 3.  Symptomatic localization-related epilepsy  1.  Due to cognitive deficits, I want to change him from topiramate to lacosamide.  I will have him start lacosamide 50mg  twice daily for a week, then increase to 100mg  twice daily.  Once he is on 100mg  twice daily, I will have him taper off of topiramate to 100mg  at bedtime for a week, then 50mg  at bedtime for a week, then stop. 2.  For migraine prevention, I have recommended Botox.  He has failed multiple preventatives such as Depakote, topiramate, propranolol, amitriptyline and was unable to afford Aimovig.  He will contact me with his decision. 3.  Sumatriptan and/or naproxen for migraine abortive therapy.  Limit use of pain relievers to no more than 2 days out of week to prevent risk of rebound or medication-overuse headache. 4.  Keep headache diary 5.  Follow up in 4 to 6 months.   Follow Up Instructions:    -I discussed the assessment and treatment plan with the patient. The patient was provided an opportunity to ask questions and all were answered. The patient agreed with the plan and demonstrated an understanding of the instructions.   The patient was advised to call  back or seek an in-person evaluation if the symptoms worsen or if the condition fails to improve as anticipated.   Dudley Major, DO

## 2020-03-12 ENCOUNTER — Telehealth (INDEPENDENT_AMBULATORY_CARE_PROVIDER_SITE_OTHER): Payer: Medicare Other | Admitting: Neurology

## 2020-03-12 ENCOUNTER — Encounter: Payer: Self-pay | Admitting: Neurology

## 2020-03-12 ENCOUNTER — Other Ambulatory Visit: Payer: Self-pay

## 2020-03-12 DIAGNOSIS — D332 Benign neoplasm of brain, unspecified: Secondary | ICD-10-CM

## 2020-03-12 DIAGNOSIS — G40209 Localization-related (focal) (partial) symptomatic epilepsy and epileptic syndromes with complex partial seizures, not intractable, without status epilepticus: Secondary | ICD-10-CM

## 2020-03-12 DIAGNOSIS — G43709 Chronic migraine without aura, not intractable, without status migrainosus: Secondary | ICD-10-CM

## 2020-03-12 MED ORDER — SUMATRIPTAN SUCCINATE 100 MG PO TABS: ORAL_TABLET | ORAL | 5 refills | Status: DC

## 2020-03-12 MED ORDER — TOPIRAMATE 50 MG PO TABS: 150.0000 mg | ORAL_TABLET | Freq: Every day | ORAL | 0 refills | Status: DC

## 2020-03-12 MED ORDER — LACOSAMIDE 100 MG PO TABS: ORAL_TABLET | ORAL | 0 refills | Status: DC

## 2020-03-20 ENCOUNTER — Encounter (HOSPITAL_COMMUNITY): Payer: Self-pay | Admitting: Emergency Medicine

## 2020-03-20 ENCOUNTER — Emergency Department (HOSPITAL_COMMUNITY): Payer: Medicare Other

## 2020-03-20 ENCOUNTER — Emergency Department (HOSPITAL_COMMUNITY)
Admission: EM | Admit: 2020-03-20 | Discharge: 2020-03-20 | Disposition: A | Discharge status: Home / Self Care | Payer: Medicare Other | Attending: Emergency Medicine | Admitting: Emergency Medicine

## 2020-03-20 ENCOUNTER — Other Ambulatory Visit: Payer: Self-pay

## 2020-03-20 DIAGNOSIS — U071 COVID-19: Secondary | ICD-10-CM | POA: Insufficient documentation

## 2020-03-20 DIAGNOSIS — R52 Pain, unspecified: Secondary | ICD-10-CM

## 2020-03-20 DIAGNOSIS — R0602 Shortness of breath: Secondary | ICD-10-CM | POA: Diagnosis present

## 2020-03-20 DIAGNOSIS — R531 Weakness: Secondary | ICD-10-CM

## 2020-03-20 DIAGNOSIS — J1282 Pneumonia due to coronavirus disease 2019: Secondary | ICD-10-CM | POA: Insufficient documentation

## 2020-03-20 LAB — COMPREHENSIVE METABOLIC PANEL
ALT: 23 U/L (ref 0–44)
AST: 33 U/L (ref 15–41)
Albumin: 3.2 g/dL — ABNORMAL LOW (ref 3.5–5.0)
Alkaline Phosphatase: 61 U/L (ref 38–126)
Anion gap: 10 (ref 5–15)
BUN: 15 mg/dL (ref 6–20)
CO2: 24 mmol/L (ref 22–32)
Calcium: 8.5 mg/dL — ABNORMAL LOW (ref 8.9–10.3)
Chloride: 103 mmol/L (ref 98–111)
Creatinine, Ser: 1.7 mg/dL — ABNORMAL HIGH (ref 0.61–1.24)
GFR calc non Af Amer: 53 mL/min — ABNORMAL LOW (ref >60)
Glucose, Bld: 128 mg/dL — ABNORMAL HIGH (ref 70–99)
Potassium: 4.2 mmol/L (ref 3.5–5.1)
Sodium: 137 mmol/L (ref 135–145)
Total Bilirubin: 0.5 mg/dL (ref 0.3–1.2)
Total Protein: 7.3 g/dL (ref 6.5–8.1)

## 2020-03-20 LAB — CBC
HCT: 41.5 % (ref 39.0–52.0)
Hemoglobin: 13 g/dL (ref 13.0–17.0)
MCH: 27.6 pg (ref 26.0–34.0)
MCHC: 31.3 g/dL (ref 30.0–36.0)
MCV: 88.1 fL (ref 80.0–100.0)
Platelets: 209 10*3/uL (ref 150–400)
RBC: 4.71 MIL/uL (ref 4.22–5.81)
RDW: 13.9 % (ref 11.5–15.5)
WBC: 6 10*3/uL (ref 4.0–10.5)
nRBC: 0 % (ref 0.0–0.2)

## 2020-03-20 MED ORDER — SODIUM CHLORIDE 0.9 % IV SOLN: INTRAVENOUS | Status: DC | PRN

## 2020-03-20 MED ORDER — ACETAMINOPHEN 500 MG PO TABS
1000.0000 mg | ORAL_TABLET | Freq: Once | ORAL | Status: AC
  Administered 2020-03-20: 1000 mg via ORAL
  Filled 2020-03-20: qty 2

## 2020-03-20 MED ORDER — SODIUM CHLORIDE 0.9 % IV BOLUS
1000.0000 mL | Freq: Once | INTRAVENOUS | Status: AC
  Administered 2020-03-20: 1000 mL via INTRAVENOUS

## 2020-03-20 MED ORDER — EPINEPHRINE 0.3 MG/0.3ML IJ SOAJ: 0.3000 mg | Freq: Once | INTRAMUSCULAR | Status: DC | PRN

## 2020-03-20 MED ORDER — FAMOTIDINE IN NACL 20-0.9 MG/50ML-% IV SOLN: 20.0000 mg | Freq: Once | INTRAVENOUS | Status: DC | PRN

## 2020-03-20 MED ORDER — ALBUTEROL SULFATE HFA 108 (90 BASE) MCG/ACT IN AERS: 2.0000 | INHALATION_SPRAY | Freq: Once | RESPIRATORY_TRACT | Status: DC | PRN

## 2020-03-20 MED ORDER — SODIUM CHLORIDE 0.9 % IV SOLN: 1200.0000 mg | Freq: Once | INTRAVENOUS | Status: DC

## 2020-03-20 MED ORDER — DM-GUAIFENESIN ER 30-600 MG PO TB12
1.0000 | ORAL_TABLET | Freq: Once | ORAL | Status: AC
  Administered 2020-03-20: 1 via ORAL
  Filled 2020-03-20: qty 1

## 2020-03-20 MED ORDER — METHYLPREDNISOLONE SODIUM SUCC 125 MG IJ SOLR: 125.0000 mg | Freq: Once | INTRAMUSCULAR | Status: DC | PRN

## 2020-03-20 MED ORDER — DIPHENHYDRAMINE HCL 50 MG/ML IJ SOLN: 50.0000 mg | Freq: Once | INTRAMUSCULAR | Status: DC | PRN

## 2020-03-20 MED ORDER — SODIUM CHLORIDE 0.9 % IV SOLN
Freq: Once | INTRAVENOUS | Status: AC
  Filled 2020-03-20 (×2): qty 5

## 2020-03-20 NOTE — ED Notes (Signed)
Pt was ambulated on Pulse Oximetry by this RN. Pt maintained Oxygen saturation above 99% during the entire process but did become increasingly SOB with exertion. Pt returned to stretcher.

## 2020-03-20 NOTE — ED Notes (Signed)
No signs of allergic reaction from antibody infusion noted by this RN.

## 2020-03-20 NOTE — ED Notes (Signed)
This RN requested Pharmacy to verify medications and REGN Infusion.

## 2020-03-20 NOTE — ED Notes (Signed)
Patient verbalizes understanding of discharge instructions. Opportunity for questioning and answers were provided. Armband removed by staff, pt discharged from ED in wheelchair to home.   

## 2020-03-20 NOTE — ED Provider Notes (Signed)
[facility] EMERGENCY DEPARTMENT Provider Note   CSN: [id number] Arrival date & time: 03/20/20  1300     History Chief Complaint  Patient presents with  . COVID+  . Weakness    Donald Mayer is a 34 y.o. male.  Donald Mayer is a 34 y.o. male with a history of morbid obesity, back injury, seizures, who presents to the emergency department for evaluation of COVID with worsening shortness of breath and generalized body aches and weakness.  Patient was initially diagnosed with Covid on 9/29 in [place] after he was exposed to a family member, at this time he had only mild cough, but on 10/4 he had worsening shortness of breath and was taken back to the hospital and found to have Covid pneumonia, but did not require admission.  He states that today he has had diffuse body aches and continues to feel very short of breath, primarily with activity.  He reports chest pain primarily after cough.  No abdominal pain, he has been nauseated but has not vomited, has had some occasional diarrhea and reports very poor appetite.  He was prescribed azithromycin, prednisone and albuterol at outside hospital and has been using these for his symptoms, he has also been taking Motrin for body aches and fevers.  He has not had any antibody infusions or other treatments.  Patient did not receive his Covid vaccine.        Past Medical History:  Diagnosis Date  . Back injuries   . Seizure [facility])     Patient Active Problem List   Diagnosis Date Noted  . Palpitations 11/03/2016  . Morbid obesity ([place]) 11/03/2016  . Brain mass 04/30/2016  . Generalized weakness 04/30/2016  . Nonintractable headache     Past Surgical History:  Procedure Laterality Date  . APPLICATION OF CRANIAL NAVIGATION N/A 05/04/2016   Procedure: APPLICATION OF CRANIAL NAVIGATION;  Surgeon: Consuella Lose, MD;  Location: [place];  Service: Neurosurgery;  Laterality: N/A;  . ORIF WRIST FRACTURE Left  12/15/2012   Procedure: OPEN REDUCTION INTERNAL FIXATION (ORIF) WRIST FRACTURE; Left.  Reduction, repair, and reconnstruction of perilunate fracture dislocation;  Surgeon: Roseanne Kaufman, MD;  Location: [facility];  Service: Orthopedics;  Laterality: Left;  . STERIOTACTIC STIMULATOR INSERTION Right 05/04/2016   Procedure: STERIOTACTIC RIGHT FRONTAL CRANIOTOMY with BrainLab;  Surgeon: Consuella Lose, MD;  Location: [facility];  Service: Neurosurgery;  Laterality: Right;  right       Family History  Problem Relation Age of Onset  . Breast cancer Maternal Grandmother   . Lung cancer Maternal Grandfather   . Brain cancer Neg Hx     Social History   Tobacco Use  . Smoking status: Never Smoker  . Smokeless tobacco: Never Used  Vaping Use  . Vaping Use: Never used  Substance Use Topics  . Alcohol use: Yes    Comment: occasional  . Drug use: No    Home Medications Prior to Admission medications   Medication Sig Start Date End Date Taking? Authorizing Provider  dexamethasone (DECADRON) 6 MG tablet Take 6 mg by mouth 2 (two) times daily with a meal.   Yes [provider]  SUMAtriptan (IMITREX) 100 MG tablet Take 1 tablet earliest onset of migraine.  May repeat in 2 hours if headache persists or recurs.  Maximum 2 tablets in 24 hours. 03/12/20  Yes Jaffe, Adam R, DO  topiramate (TOPAMAX) 50 MG tablet Take 3 tablets (150 mg total) by mouth at bedtime. Patient  taking differently: Take 100 mg by mouth at bedtime.  03/12/20  Yes Jaffe, Adam R, DO  Erenumab-aooe (AIMOVIG) 70 MG/ML SOAJ Inject 70 mg into the skin every 30 (thirty) days. Patient not taking: Reported on 10/26/2019 06/26/19   Pieter Partridge, DO  Lacosamide 100 MG TABS Take 1/2 tablet twice daily for 7 days, then increase to 1 tablet twice daily. Patient not taking: Reported on 03/20/2020 03/12/20   Pieter Partridge, DO  LORazepam (ATIVAN) 1 MG tablet SMARTSIG:1-2 Tablet(s) By Mouth Patient not taking: Reported on 03/20/2020 02/06/20    [provider]  propranolol (INDERAL) 40 MG tablet Take 1 tablet (40 mg total) by mouth 2 (two) times daily. Patient not taking: Reported on 10/26/2019 02/21/19   Pieter Partridge, DO    Allergies    Multihance [gadobenate] and Contrast media [iodinated diagnostic agents]  Review of Systems   Review of Systems  Constitutional: Positive for chills, fatigue and fever.  HENT: Positive for congestion.   Respiratory: Positive for cough and shortness of breath.   Cardiovascular: Positive for chest pain. Negative for palpitations and leg swelling.  Gastrointestinal: Positive for diarrhea and nausea. Negative for abdominal pain, constipation and vomiting.  Genitourinary: Negative for dysuria.  Musculoskeletal: Positive for myalgias.  Neurological: Positive for headaches. Negative for dizziness, weakness, light-headedness and numbness.  All other systems reviewed and are negative.   Physical Exam Updated Vital Signs BP 115/65 (BP Location: Left Arm)   Pulse 91   Temp 99.4 F (37.4 C) (Oral)   Resp 18   Ht 6\' 2"  (1.88 m)   Wt (!) 173.7 kg   SpO2 96%   BMI 49.17 kg/m   Physical Exam Vitals and nursing note reviewed.  Constitutional:      General: He is not in acute distress.    Appearance: Normal appearance. He is well-developed. He is obese. He is not ill-appearing or diaphoretic.     Comments: Patient appears as though he does not feel well but is overall nontoxic and in no acute distress  HENT:     Head: Normocephalic and atraumatic.     Mouth/Throat:     Mouth: Mucous membranes are moist.     Pharynx: Oropharynx is clear.  Eyes:     General:        Right eye: No discharge.        Left eye: No discharge.  Neck:     Comments: No rigidity Cardiovascular:     Rate and Rhythm: Normal rate and regular rhythm.     Heart sounds: Normal heart sounds. No murmur heard.  No friction rub. No gallop.   Pulmonary:     Effort: Pulmonary effort is normal. No respiratory distress.       Breath sounds: Normal breath sounds.     Comments: Respirations equal and unlabored, patient able to speak in full sentences, lungs clear to auscultation bilaterally with slightly diminished breath sounds in bilateral bases Abdominal:     General: Bowel sounds are normal. There is no distension.     Palpations: Abdomen is soft. There is no mass.     Tenderness: There is no abdominal tenderness. There is no guarding.     Comments: Abdomen soft, nondistended, nontender to palpation in all quadrants without guarding or peritoneal signs  Musculoskeletal:        General: No deformity.     Cervical back: Neck supple.     Left lower leg: No edema.  Lymphadenopathy:  Cervical: No cervical adenopathy.  Skin:    General: Skin is warm and dry.     Capillary Refill: Capillary refill takes less than 2 seconds.  Neurological:     General: No focal deficit present.     Mental Status: He is alert and oriented to person, place, and time.  Psychiatric:        Mood and Affect: Mood normal.        Behavior: Behavior normal.     ED Results / Procedures / Treatments   Labs (all labs ordered are listed, but only abnormal results are displayed) Labs Reviewed  COMPREHENSIVE METABOLIC PANEL - Abnormal; Notable for the following components:      Result Value   Glucose, Bld 128 (*)    Creatinine, Ser 1.70 (*)    Calcium 8.5 (*)    Albumin 3.2 (*)    GFR calc non Af Amer 53 (*)    All other components within normal limits  CBC    EKG EKG Interpretation  Date/Time:  Wednesday March 20 2020 15:28:04 EDT Ventricular Rate:  94 PR Interval:    QRS Duration: 82 QT Interval:  325 QTC Calculation: 407 R Axis:   77 Text Interpretation: Sinus rhythm Borderline T wave abnormalities No significant change since last tracing Confirmed by Blanchie Dessert ([id number]) on 03/20/2020 3:30:02 PM   Radiology DG Chest Port 1 View  Result Date: 03/20/2020 CLINICAL DATA:  COVID EXAM: PORTABLE CHEST 1 VIEW  COMPARISON:  02/23/2017 FINDINGS: Mild heterogeneous basilar opacities. No pleural effusion. Normal heart size. No pneumothorax IMPRESSION: Mild heterogeneous basilar opacities, suspect for mild pneumonia. Electronically Signed   By: Donavan Foil M.D.   On: 03/20/2020 15:52    Procedures Procedures (including critical care time)  Medications Ordered in ED Medications  0.9 %  sodium chloride infusion (has no administration in time range)  diphenhydrAMINE (BENADRYL) injection 50 mg (has no administration in time range)  famotidine (PEPCID) IVPB 20 mg premix (has no administration in time range)  methylPREDNISolone sodium succinate (SOLU-MEDROL) 125 mg/2 mL injection 125 mg (has no administration in time range)  albuterol (VENTOLIN HFA) 108 (90 Base) MCG/ACT inhaler 2 puff (has no administration in time range)  EPINEPHrine (EPI-PEN) injection 0.3 mg (has no administration in time range)  sodium chloride 0.9 % bolus 1,000 mL (0 mLs Intravenous Stopped 03/20/20 2017)  dextromethorphan-guaiFENesin (MUCINEX DM) 30-600 MG per 12 hr tablet 1 tablet (1 tablet Oral Given 03/20/20 1835)  acetaminophen (TYLENOL) tablet 1,000 mg (1,000 mg Oral Given 03/20/20 1835)  casirivimab (REGN 10933) 600 mg, imdevimab (REGN 10987) 600 mg in sodium chloride 0.9 % 110 mL IVPB ( Intravenous Stopped 03/20/20 1902)    ED Course  I have reviewed the triage vital signs and the nursing notes.  Pertinent labs & imaging results that were available during my care of the patient were reviewed by me and considered in my medical decision making (see chart for details).    MDM Rules/Calculators/A&P                         34 year old male presents with known COVID-19 infection with symptoms that began on 9/29, he has been receiving his care at an outside hospital in [place], return to the hospital on 10/4 due to worsening shortness of breath, fevers, generalized body aches and weakness and was diagnosed with Covid  pneumonia but did not have oxygen requirement and was discharged home with Decadron, azithromycin and  albuterol.  He returns today due to worsening symptoms.  Patient states he is feeling very poorly with diffuse body aches throughout, feels weak, has no appetite and feels short of breath.  Despite the symptoms patient with O2 sats greater than 99% with ambulation.  Will check basic lab work, EKG and chest x-ray.  Patient is on day 8 of symptoms and has not yet had monoclonal antibody infusion I have offered this to the patient today and discussed the risk and benefits with this and its approval for emergency use and patient is agreeable and would like to receive this infusion today.  Lab work shows no leukocytosis, normal hemoglobin, slight bump in creatinine of 1.7, likely due to dehydration, glucose of 128 but no other significant electrolyte derangements, will give IV fluid bolus.  EKG is unremarkable.  Chest x-ray shows very mild signs of pneumonia.  Patient tolerated antibody infusion well, vitals remained stable afterwards with no oxygen requirement.  Discussed at length supportive treatment for symptoms at home and strict return precautions and at this time patient is stable for discharge home.  Donald Mayer was evaluated in Emergency Department on 03/20/2020 for the symptoms described in the history of present illness. He was evaluated in the context of the global COVID-19 pandemic, which necessitated consideration that the patient might be at risk for infection with the SARS-CoV-2 virus that causes COVID-19. Institutional protocols and algorithms that pertain to the evaluation of patients at risk for COVID-19 are in a state of rapid change based on information released by regulatory bodies including the CDC and federal and state organizations. These policies and algorithms were followed during the patient's care in the ED.  Final Clinical Impression(s) / ED Diagnoses Final diagnoses:    Pneumonia due to COVID-19 virus  Weakness  Generalized body aches    Rx / DC Orders ED Discharge Orders    None       Janet Berlin 03/20/20 2022    Blanchie Dessert, MD 03/20/20 2352

## 2020-03-20 NOTE — Discharge Instructions (Addendum)
You were given Covid antibody infusion today to try and help prevent worsening symptoms.  Continue to treat your symptoms supportively with Tylenol 1000 mg every 6 hours, your kidney function was a bit elevated today likely due to dehydration, so please avoid using ibuprofen/Motrin/Advil for the next few days.  You need to have your kidney function rechecked in a week with your primary care doctor.  Continue to monitor your oxygen sats at home and if these are persistently below 92% you should return to the hospital.  Your chest x-ray did show very mild Covid pneumonia today.  Make sure you are staying well-hydrated.  Return for new or worsening symptoms.

## 2020-03-20 NOTE — ED Triage Notes (Signed)
Patient reports that he was tested positive for COVID19 on 9/29. Pt states that he has been getting treatment in [place], [place] and was diagnosed with COVID pneumonia on 10/4. Pt states he still feels SOB and weak. Pt also states he is still getting fevers.

## 2020-03-21 ENCOUNTER — Telehealth: Payer: Self-pay | Admitting: Family Medicine

## 2020-03-21 NOTE — Telephone Encounter (Signed)
Pt was called per referral from [facility]. lvm

## 2020-06-28 ENCOUNTER — Other Ambulatory Visit: Payer: Self-pay | Admitting: Neurology

## 2020-06-28 ENCOUNTER — Telehealth: Payer: Self-pay | Admitting: Neurology

## 2020-06-28 NOTE — Telephone Encounter (Signed)
Patient needs refill of Amitriptyline sent to the [facility] on [address] in [place], [place]. The pharmacy told him that there were no refills left on the prescription.

## 2020-06-28 NOTE — Telephone Encounter (Signed)
Note sent to the provider.

## 2020-08-05 NOTE — Progress Notes (Signed)
Virtual Visit via Video Note The purpose of this virtual visit is to provide medical care while limiting exposure to the novel coronavirus.    Consent was obtained for video visit:  Yes.   Answered questions that patient had about telehealth interaction:  Yes.   I discussed the limitations, risks, security and privacy concerns of performing an evaluation and management service by telemedicine. I also discussed with the patient that there may be a patient responsible charge related to this service. The patient expressed understanding and agreed to proceed.  Pt location: Home Physician Location: office Name of referring provider:  Francis Gaines, FNP I connected with Donald Mayer at patients initiation/request on 08/06/2020 at  8:50 AM EST by video enabled telemedicine application and verified that I am speaking with the correct person using two identifiers. Pt MRN:  [id number] Pt DOB:  1985/08/06 Video Participants:  Donald Mayer  Assessment and Plan:   1.  DNET 2.  Symptomatic localization-related epilepsy 3.  Chronic migraine without aura, without status migrainosus, not intractable  1.  Seizure prophylaxis: Start carbamazepine 200mg  twice daily and titrate to 400mg  twice daily.  I would ideally like him on lacosamide due to side effect profile.  Will have him fill out SSA form   2.  Migraine prophylaxis:  Will start Botox.  Continue amitriptyline 75mg  at bedtime 3.  Migraine rescue:  Sumatriptan and/or naproxen 3.  Limit use of pain relievers to no more than 2 days out of week to prevent risk of rebound or medication-overuse headache. 4.  Keep headache diary 5.  Repeat MRI of brain with and without contrast. 6.  Follow up 6 months.  History of Present Illness:  Donald Mayer is a 35year old male who follows up for migraines, black out spells, and DNET.  UPDATE: Current medications: amitriptyline 75mg  at bedtime, sumatriptan 100mg   Due to cognitive changes,  I switched him from topiramate to lacosmaide in September.  However, he has not been taking due to inability to afford it.  If he takes a shower, he sometimes feels like he is going to black out.  I also proposed starting Botox for migraine.  Migraines are severe, 2-3 days a week, takes sumatriptan and goes to sleep.  Wakes up 2 1/2 to 3 hours later and headache resolved.  Hasn't been able to follow up with neurosurgery because he reportedly owes money.  Last MRI was in August.   HISTORY: On 04/28/16, he had 2 episodes of syncope. Afterwards, he developed a mild headache. CT of head from 04/30/16 revealed area of low attenuation in the right frontotemporal region. 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 He underwent a frontal craniotomy on 05/04/16 for resection of the tumor. Postoperative MRI revealed postsurgical changes but no residual tumor. 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 EEG at that time was normal. Hewas placedon Keppra as a prophylactic  Migraines started around this time. They are right frontal, throbbing, 9-10/10 intensity and associated withblurred vision in right eye,nausea, photophobia, and phonophobia. There is no preceding aura. They last until he goes to sleep and occurs daily. They are triggered or aggravated by light and sound and are relieved by sleep. He has tried ibuprofen (takes daily) and Fioricet. He is often unable to function.   He had another MRI of brain without contrast on 09/17/16, which  revealed low level T2 and FLAIR signal around the region of resection which could represent sequelae of previous surgery, but could not exclude recurrent tumor. He followed up with neurosurgery and is monitoring for now. To further assess syncope, he had a 24 hour holter monitor in June 2018, which was unremarkable.  In October 2018,  he started having black-out spells again. Sometimes, it occurs when he is standing on his feet, such as in the shower or walking down the stairs. He feels lightheaded and then passes out. However, he also reports episodes while just sitting or reclining on the couch or bed. He has no preceding warning. He wakes up and notes a loss of time. Family members have not reported any noticeable seizure-like activity if he is sleeping or unconscious on the couch. It occurs once in a while. EEG from 07/07/17 was normal. He had a repeat MRI of brain with and without contrast on 08/11/17, which showed "slightly progressed T2/FLAIR signal abnormality along the superior margin of the right frontal resection cavity, suspicious for possible locally recurrent disease. No significant mass effect."He followed upwith his neurosurgeon, Dr. Kathyrn Sheriff, who recommended continued monitoring. Repeat MRI of brain without contrast on 10/24/18 showed "minimal progression of T2/FLAIR signal abnormality along the margins of the right frontal resection cavity which may reflect recurrent tumor." Contrast was not administered due to inability to obtain IV access. Plan is to try and resect the recurrent tumor within the next 6 months but would like to get the seizures under control first.  Reat MRI of brain with and without contrast on 02/06/2020 showed stable postoperative changes.  Past medications: Depakote (worsened headaches), Keppra 750mg  twice daily(irritability); amitriptyline; propranolol 40mg  twice daily (increased migraines), Aimovig (unable to afford due to insurance), topiramate 150mg  at bedtime (cognitive changes)   Past Medical History: Past Medical History:  Diagnosis Date  . Back injuries   . Seizure [facility])     Medications: Outpatient Encounter Medications as of 08/06/2020  Medication Sig Note  . amitriptyline (ELAVIL) 75 MG tablet TAKE 1 TABLET BY MOUTH AT BEDTIME   . dexamethasone (DECADRON) 6 MG tablet  Take 6 mg by mouth 2 (two) times daily with a meal. 03/20/2020: Havent had evening dose   . Erenumab-aooe (AIMOVIG) 70 MG/ML SOAJ Inject 70 mg into the skin every 30 (thirty) days. (Patient not taking: Reported on 10/26/2019) 10/26/2019: Unable to get due to insurance issues  . Lacosamide 100 MG TABS Take 1/2 tablet twice daily for 7 days, then increase to 1 tablet twice daily. (Patient not taking: Reported on 03/20/2020)   . LORazepam (ATIVAN) 1 MG tablet SMARTSIG:1-2 Tablet(s) By Mouth (Patient not taking: Reported on 03/20/2020)   . propranolol (INDERAL) 40 MG tablet Take 1 tablet (40 mg total) by mouth 2 (two) times daily. (Patient not taking: Reported on 10/26/2019) 10/26/2019: Unable to get due to insurance issues  . SUMAtriptan (IMITREX) 100 MG tablet Take 1 tablet earliest onset of migraine.  May repeat in 2 hours if headache persists or recurs.  Maximum 2 tablets in 24 hours.   . topiramate (TOPAMAX) 50 MG tablet Take 3 tablets (150 mg total) by mouth at bedtime. (Patient taking differently: Take 100 mg by mouth at bedtime. )    No facility-administered encounter medications on file as of 08/06/2020.    Allergies: Allergies  Allergen Reactions  . Multihance [Gadobenate] Nausea And Vomiting    **Contrast**  . Contrast Media [Iodinated Diagnostic Agents]     Family History: Family History  Problem Relation Age of Onset  . Breast cancer Maternal Grandmother   . Lung cancer Maternal Grandfather   . Brain cancer Neg Hx     Observations/Objective:   Height 6\' 2"  (1.88 m), weight (!) 390 lb (176.9 kg). No acute distress.  Alert and oriented.  Speech fluent and not dysarthric.  Language intact.    Follow Up Instructions:    -I discussed the assessment and treatment plan with the patient. The patient was provided an opportunity to ask questions and all were answered. The patient agreed with the plan and demonstrated an understanding of the instructions.   The patient was advised to call  back or seek an in-person evaluation if the symptoms worsen or if the condition fails to improve as anticipated.    Dudley Major, DO

## 2020-08-06 ENCOUNTER — Telehealth (INDEPENDENT_AMBULATORY_CARE_PROVIDER_SITE_OTHER): Payer: Medicare Other | Admitting: Neurology

## 2020-08-06 ENCOUNTER — Encounter: Payer: Self-pay | Admitting: Neurology

## 2020-08-06 ENCOUNTER — Other Ambulatory Visit: Payer: Self-pay

## 2020-08-06 VITALS — Ht 74.0 in | Wt 390.0 lb

## 2020-08-06 DIAGNOSIS — G43709 Chronic migraine without aura, not intractable, without status migrainosus: Secondary | ICD-10-CM | POA: Diagnosis not present

## 2020-08-06 DIAGNOSIS — G35 Multiple sclerosis: Secondary | ICD-10-CM | POA: Diagnosis not present

## 2020-08-06 DIAGNOSIS — D332 Benign neoplasm of brain, unspecified: Secondary | ICD-10-CM | POA: Diagnosis not present

## 2020-08-06 DIAGNOSIS — Z79899 Other long term (current) drug therapy: Secondary | ICD-10-CM

## 2020-08-06 DIAGNOSIS — G40209 Localization-related (focal) (partial) symptomatic epilepsy and epileptic syndromes with complex partial seizures, not intractable, without status epilepticus: Secondary | ICD-10-CM

## 2020-08-06 MED ORDER — CARBAMAZEPINE 200 MG PO TABS: ORAL_TABLET | ORAL | 0 refills | Status: DC

## 2020-08-06 NOTE — Addendum Note (Signed)
Addended by: Venetia Night on: 08/06/2020 11:45 AM   Modules accepted: Orders

## 2020-08-06 NOTE — Patient Instructions (Signed)
Start carbamazepine  botox

## 2020-08-08 ENCOUNTER — Telehealth: Payer: Self-pay

## 2020-08-08 NOTE — Telephone Encounter (Signed)
Patient advised, shena to mail off papers to patient.

## 2020-08-08 NOTE — Telephone Encounter (Signed)
LMOVM, Please give the office a call back. We need to go over the application forms for SSA.

## 2020-08-27 ENCOUNTER — Other Ambulatory Visit (INDEPENDENT_AMBULATORY_CARE_PROVIDER_SITE_OTHER): Payer: Medicare Other

## 2020-08-27 ENCOUNTER — Other Ambulatory Visit: Payer: Self-pay

## 2020-08-27 DIAGNOSIS — Z79899 Other long term (current) drug therapy: Secondary | ICD-10-CM

## 2020-08-27 DIAGNOSIS — D332 Benign neoplasm of brain, unspecified: Secondary | ICD-10-CM | POA: Diagnosis not present

## 2020-08-27 LAB — COMPREHENSIVE METABOLIC PANEL
ALT: 19 U/L (ref 0–53)
AST: 21 U/L (ref 0–37)
Albumin: 4 g/dL (ref 3.5–5.2)
Alkaline Phosphatase: 93 U/L (ref 39–117)
BUN: 13 mg/dL (ref 6–23)
CO2: 28 mEq/L (ref 19–32)
Calcium: 9.6 mg/dL (ref 8.4–10.5)
Chloride: 99 mEq/L (ref 96–112)
Creatinine, Ser: 1.46 mg/dL (ref 0.40–1.50)
GFR: 63.32 mL/min (ref >60.00)
Glucose, Bld: 92 mg/dL (ref 70–99)
Potassium: 4 mEq/L (ref 3.5–5.1)
Sodium: 135 mEq/L (ref 135–145)
Total Bilirubin: 0.5 mg/dL (ref 0.2–1.2)
Total Protein: 8.2 g/dL (ref 6.0–8.3)

## 2020-08-27 LAB — CBC
HCT: 41.4 % (ref 39.0–52.0)
Hemoglobin: 14 g/dL (ref 13.0–17.0)
MCHC: 33.7 g/dL (ref 30.0–36.0)
MCV: 84.7 fl (ref 78.0–100.0)
Platelets: 220 10*3/uL (ref 150.0–400.0)
RBC: 4.89 Mil/uL (ref 4.22–5.81)
RDW: 15 % (ref 11.5–15.5)
WBC: 8.6 10*3/uL (ref 4.0–10.5)

## 2020-09-02 ENCOUNTER — Telehealth: Payer: Self-pay | Admitting: Neurology

## 2020-09-02 ENCOUNTER — Other Ambulatory Visit: Payer: Medicare Other

## 2020-09-02 MED ORDER — DIAZEPAM 5 MG PO TABS: ORAL_TABLET | ORAL | 0 refills | Status: DC

## 2020-09-02 NOTE — Telephone Encounter (Signed)
Rx for Valium 5mg  sent.  Please be sure pt has someone to drive him. Thanks.

## 2020-09-02 NOTE — Telephone Encounter (Signed)
Can an Rx be sent in?

## 2020-09-02 NOTE — Telephone Encounter (Signed)
Patient has an MRI today at 3:20 PM. He said he thought he had a "standing order" for a medication to help calm him for the test but there is not one.  Patient requesting help with this as soon as possible since the test is today.  [facility] [address] [facility] [address] [facility] in [place]

## 2020-09-02 NOTE — Telephone Encounter (Signed)
I tried to get intouch with patient, no answer.

## 2020-09-08 NOTE — Telephone Encounter (Signed)
It looks like the MRI has now been scheduled for 4/13.  Dr. Posey Pronto sent a prescription for diazepam last week.  He should pick that up and take for the upcoming MRI (must have a driver to and from the MRI as the diazepam causes drowsiness)

## 2020-09-11 NOTE — Telephone Encounter (Signed)
Has this been completed?  Sending to clinical staff for review: Okay to sign/close encounter or is further follow up needed? 

## 2020-09-25 ENCOUNTER — Ambulatory Visit
Admission: RE | Admit: 2020-09-25 | Discharge: 2020-09-25 | Disposition: A | Discharge status: Home / Self Care | Payer: Medicare Other | Source: Ambulatory Visit | Attending: Neurology | Admitting: Neurology

## 2020-09-25 ENCOUNTER — Other Ambulatory Visit: Payer: Self-pay

## 2020-09-25 DIAGNOSIS — D332 Benign neoplasm of brain, unspecified: Secondary | ICD-10-CM

## 2020-09-25 MED ORDER — GADOBUTROL 1 MMOL/ML IV SOLN
10.0000 mL | Freq: Once | INTRAVENOUS | Status: AC | PRN
  Administered 2020-09-25: 10 mL via INTRAVENOUS

## 2020-10-04 ENCOUNTER — Other Ambulatory Visit: Payer: Self-pay | Admitting: Radiation Therapy

## 2020-10-16 ENCOUNTER — Telehealth: Payer: Self-pay | Admitting: Neurology

## 2020-10-16 NOTE — Telephone Encounter (Signed)
Returned PT call. NO answer. LMOVM.

## 2020-10-16 NOTE — Telephone Encounter (Signed)
New message    Patient did not disclose any information - will discuss when the nurse call back

## 2020-12-06 ENCOUNTER — Telehealth: Payer: Self-pay | Admitting: Neurology

## 2020-12-06 NOTE — Telephone Encounter (Signed)
He said he needs a letter stating he is now allowed to stay alone. [phone]

## 2020-12-10 NOTE — Telephone Encounter (Signed)
Patient returned call and has been added to the wait list. He is on the schedule for 02/10/21.

## 2020-12-10 NOTE — Telephone Encounter (Signed)
Attempted to call patient for scheduling but his voice mail box is full and I could not leave a message.

## 2020-12-23 NOTE — Progress Notes (Signed)
Virtual Visit via Video Note The purpose of this virtual visit is to provide medical care while limiting exposure to the novel coronavirus.    Consent was obtained for video visit:  Yes.   Answered questions that patient had about telehealth interaction:  Yes.   I discussed the limitations, risks, security and privacy concerns of performing an evaluation and management service by telemedicine. I also discussed with the patient that there may be a patient responsible charge related to this service. The patient expressed understanding and agreed to proceed.  Pt location: Home Physician Location: office Name of referring provider:  Francis Gaines, FNP I connected with Donald Mayer at patients initiation/request on 12/24/2020 at  8:50 AM EDT by video enabled telemedicine application and verified that I am speaking with the correct person using two identifiers. Pt MRN:  [id number] Pt DOB:  05-26-1986 Video Participants:  Donald Mayer  Assessment and Plan:   1.  DNET 2.  Symptomatic localization-related epilepsy 3.  Chronic migraine without aura, without status migrainosus, not intractable - over 3 consecutive months of headache over 15 days per month, failed amitriptyline,    1.  Seizure prophylaxis: carbamazepine 400mg  twice daily.  Check CBC, CMP and carbamazepine level 2.  Migraine prophylaxis:  Will resubmit paperwork to start Botox.  Continue amitriptyline 75mg  at bedtime 3.  Migraine rescue:  Sumatriptan and/or naproxen 4.  Check routine EEG.  If unremarkable, 72 hour ambulatory EEG with hope to capture one of his confusional episodes. 5.  No driving 6.  Will prepare letter for landlord stating that he cannot be left alone in his apartment. 7.  Limit use of pain relievers to no more than 2 days out of week to prevent risk of rebound or medication-overuse headache. 8.  Keep headache diary 5.  Repeat MRI of brain as per neurosurgery with neurosurgery follow up 6.  Follow  up 6 months.  History of Present Illness:  Donald Mayer is a 35 year old male who follows up for migraines, black out spells, and DNET.   UPDATE: Current medications: amitriptyline 75mg  at bedtime, sumatriptan 100mg , carbamazepine 400mg  BID  MRI of brain with and without contrast on 09/25/2020 personally reviewed showed slight increase in T2/FLAIR hyperintensity along the posterior/superior aspect of the resection cavity without abnormal enhancement.  Recommended following up again with neurosurgery.  He did follow up with neurosurgery and is going to have a repeat MRI next month.     For seizure prophylaxis, I started him on carbamazepine.  No seizures that he is aware of.  However, still has has sudden confusion and forget what he is doing or what he has been talking about.  It occurs once or twice a week.  I also wanted to start Botox for chronic migraine.  He filled out paperwork but nothing has been scheduled.       HISTORY: On 04/28/16, he had 2 episodes of syncope.  Afterwards, he developed a mild headache.  CT of head from 04/30/16 revealed area of low attenuation in the right frontotemporal region.  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  He underwent a frontal craniotomy on 05/04/16 for resection of the tumor.  Postoperative MRI revealed postsurgical changes but no residual tumor.  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  EEG at that time was normal.  He was placed  on Keppra as a prophylactic   Migraines started around this time.  They are right frontal, throbbing, 9-10/10 intensity and associated with blurred vision in right eye, nausea, photophobia, and phonophobia.  There is no preceding aura.  They last until he goes to sleep and occurs daily.  They are triggered or aggravated by light and sound and are relieved by sleep.  He has tried ibuprofen (takes  daily) and Fioricet.  He is often unable to function.     He had another MRI of brain without contrast on 09/17/16, which revealed low level T2 and FLAIR signal around the region of resection which could represent sequelae of previous surgery, but could not exclude recurrent tumor.  He followed up with neurosurgery and is monitoring for now.  To further assess syncope, he had a 24 hour holter monitor in June 2018, which was unremarkable.   In October 2018, he started having black-out spells again.  Sometimes, it occurs when he is standing on his feet, such as in the shower or walking down the stairs.  He feels lightheaded and then passes out.  However, he also reports episodes while just sitting or reclining on the couch or bed.  He has no preceding warning.  He wakes up and notes a loss of time.  Family members have not reported any noticeable seizure-like activity if he is sleeping or unconscious on the couch.  It occurs once in a while.  EEG from 07/07/17 was normal.  He had a repeat MRI of brain with and without contrast on 08/11/17, which showed "slightly progressed T2/FLAIR signal abnormality along the superior margin of the right frontal resection cavity, suspicious for possible locally recurrent disease.  No significant mass effect."  He followed up with his neurosurgeon, Dr. Kathyrn Sheriff, who recommended continued monitoring.  Repeat MRI of brain without contrast on 10/24/18 showed "minimal progression of T2/FLAIR signal abnormality along the margins of the right frontal resection cavity which may reflect recurrent tumor."  Contrast was not administered due to inability to obtain IV access.  Plan is to try and resect the recurrent tumor within the next 6 months but would like to get the seizures under control first.  Reat MRI of brain with and without contrast on 02/06/2020 showed stable postoperative changes.   Past medications:  Depakote (worsened headaches), Keppra 750mg  twice daily (irritability);  amitriptyline; propranolol 40mg  twice daily (increased migraines), Aimovig (unable to afford due to insurance), topiramate 150mg  at bedtime (cognitive changes)  Past Medical History: Past Medical History:  Diagnosis Date   Back injuries    Seizure ([facility])     Medications: Outpatient Encounter Medications as of 12/24/2020  Medication Sig Note   amitriptyline (ELAVIL) 75 MG tablet TAKE 1 TABLET BY MOUTH AT BEDTIME    carbamazepine (TEGRETOL) 200 MG tablet Take 1 tablet twice daily for one week, then 1 tablet in morning and 2 tablets at bedtime for one week, then 2 tablets twice daily.    dexamethasone (DECADRON) 6 MG tablet Take 6 mg by mouth 2 (two) times daily with a meal. (Patient not taking: Reported on 08/06/2020) 03/20/2020: Havent had evening dose    diazepam (VALIUM) 5 MG tablet Take 1 tab 30-min prior to MRI.  OK to repeat at facility, if needed.  Do not drive.    Erenumab-aooe (AIMOVIG) 70 MG/ML SOAJ Inject 70 mg into the skin every 30 (thirty) days. (Patient not taking: No sig reported) 10/26/2019: Unable to get due to insurance issues   Lacosamide  100 MG TABS Take 1/2 tablet twice daily for 7 days, then increase to 1 tablet twice daily. (Patient not taking: No sig reported)    LORazepam (ATIVAN) 1 MG tablet SMARTSIG:1-2 Tablet(s) By Mouth (Patient not taking: No sig reported)    propranolol (INDERAL) 40 MG tablet Take 1 tablet (40 mg total) by mouth 2 (two) times daily. (Patient not taking: No sig reported) 10/26/2019: Unable to get due to insurance issues   SUMAtriptan (IMITREX) 100 MG tablet Take 1 tablet earliest onset of migraine.  May repeat in 2 hours if headache persists or recurs.  Maximum 2 tablets in 24 hours.    topiramate (TOPAMAX) 50 MG tablet Take 3 tablets (150 mg total) by mouth at bedtime. (Patient not taking: Reported on 08/06/2020)    No facility-administered encounter medications on file as of 12/24/2020.    Allergies: Allergies  Allergen Reactions   Multihance  [Gadobenate] Nausea And Vomiting    **Contrast**   Contrast Media [Iodinated Diagnostic Agents]     Family History: Family History  Problem Relation Age of Onset   Breast cancer Maternal Grandmother    Lung cancer Maternal Grandfather    Brain cancer Neg Hx     Observations/Objective:   Height 6\' 2"  (1.88 m), weight (!) 395 lb (179.2 kg). No acute distress.  Alert and oriented.  Speech fluent and not dysarthric.  Language intact.  Eyes orthophoric on primary gaze.  Face symmetric.   Follow Up Instructions:    -I discussed the assessment and treatment plan with the patient. The patient was provided an opportunity to ask questions and all were answered. The patient agreed with the plan and demonstrated an understanding of the instructions.   The patient was advised to call back or seek an in-person evaluation if the symptoms worsen or if the condition fails to improve as anticipated.  Dudley Major, DO

## 2020-12-24 ENCOUNTER — Telehealth (INDEPENDENT_AMBULATORY_CARE_PROVIDER_SITE_OTHER): Payer: Medicare Other | Admitting: Neurology

## 2020-12-24 ENCOUNTER — Encounter: Payer: Self-pay | Admitting: Neurology

## 2020-12-24 ENCOUNTER — Other Ambulatory Visit: Payer: Self-pay

## 2020-12-24 VITALS — Ht 74.0 in | Wt 395.0 lb

## 2020-12-24 DIAGNOSIS — R569 Unspecified convulsions: Secondary | ICD-10-CM | POA: Diagnosis not present

## 2020-12-24 DIAGNOSIS — Z79899 Other long term (current) drug therapy: Secondary | ICD-10-CM | POA: Diagnosis not present

## 2020-12-24 NOTE — Patient Instructions (Signed)
Continue carbamazepine 400mg  twice daily and amitriptyline 75mg  at bedtime Will check routine EEG.  If unremarkable, 72 hour ambulatory EEG Check CBC, CMP and carbamazepine level Will prepare letter stating that you should not be left alone in your apartment. No driving Follow up 6 months.

## 2020-12-31 ENCOUNTER — Encounter: Payer: Self-pay | Admitting: Neurology

## 2020-12-31 NOTE — Telephone Encounter (Signed)
Printed

## 2020-12-31 NOTE — Telephone Encounter (Signed)
Patient called and said he is needing the letter stating he cannot stay alone. He is needing it for court this Friday, 01/03/21.  He is trying to find a fax number to provide upon call back.

## 2021-01-01 ENCOUNTER — Telehealth: Payer: Self-pay | Admitting: Neurology

## 2021-01-01 NOTE — Progress Notes (Signed)
I contacted [facility] whom sent me to [employer] [phone] botox 20mg  does not required authorization reference number [id number].

## 2021-01-01 NOTE — Telephone Encounter (Signed)
Dalia Heading W  (10:03 AM)  Pt called in and would like the paper work faxed to [phone] ATTN: Aletta Edouard

## 2021-01-01 NOTE — Telephone Encounter (Signed)
Tried to call patient but VM is full so could not leave message.  Letter is ready for pick up.

## 2021-01-01 NOTE — Telephone Encounter (Signed)
Faxed as requested. Fax was successful 01/01/2021 10:56 am

## 2021-01-01 NOTE — Telephone Encounter (Signed)
Pt called in and would like the paper work faxed to [phone] ATTN: Aletta Edouard

## 2021-01-06 ENCOUNTER — Other Ambulatory Visit: Payer: Medicare Other

## 2021-01-09 ENCOUNTER — Other Ambulatory Visit: Payer: Self-pay | Admitting: Neurosurgery

## 2021-01-09 DIAGNOSIS — D332 Benign neoplasm of brain, unspecified: Secondary | ICD-10-CM

## 2021-01-15 ENCOUNTER — Other Ambulatory Visit: Payer: Self-pay

## 2021-01-15 NOTE — Telephone Encounter (Signed)
Declined

## 2021-01-22 ENCOUNTER — Other Ambulatory Visit: Payer: Medicare Other

## 2021-01-30 ENCOUNTER — Other Ambulatory Visit: Payer: Medicare Other

## 2021-02-03 ENCOUNTER — Other Ambulatory Visit: Payer: Medicare Other

## 2021-02-03 ENCOUNTER — Other Ambulatory Visit: Payer: Self-pay | Admitting: Neurology

## 2021-02-03 NOTE — Telephone Encounter (Signed)
Pt called in stating he is allergic to the dye they will use in his MRI today and he has not gotten the medication that he requested on 01/15/21. His MRI is at 5:00 PM today.

## 2021-02-04 NOTE — Telephone Encounter (Signed)
Pt called and informed that the Dr who ordered the MRI would be the one to order his Valium for the MRI

## 2021-02-10 ENCOUNTER — Ambulatory Visit: Payer: Medicare Other | Admitting: Neurology

## 2021-02-13 ENCOUNTER — Ambulatory Visit (INDEPENDENT_AMBULATORY_CARE_PROVIDER_SITE_OTHER): Payer: Medicaid - Out of State | Admitting: Neurology

## 2021-02-13 ENCOUNTER — Other Ambulatory Visit: Payer: Self-pay

## 2021-02-13 DIAGNOSIS — R569 Unspecified convulsions: Secondary | ICD-10-CM

## 2021-02-14 NOTE — Procedures (Signed)
ELECTROENCEPHALOGRAM REPORT  Date of Study: 02/13/2021  Patient's Name: Donald Mayer MRN: [id number] Date of Birth: Sep 25, 1985   Clinical History: 35 year old male with right frontal DNET presents with confusional spells.  Medications: ELAVIL 75 MG tablet TEGRETOL 200 MG tablet DECADRON 6 MG tablet VALIUM 5 MG tablet IMITREX 100 MG tablet  Technical Summary: A multichannel digital EEG recording measured by the international 10-20 system with electrodes applied with paste and impedances below 5000 ohms performed in our laboratory with EKG monitoring in an awake and asleep patient.  Photic stimulation was performed.  The digital EEG was referentially recorded, reformatted, and digitally filtered in a variety of bipolar and referential montages for optimal display.    Description: The patient is awake and asleep during the recording.  During maximal wakefulness, there is a symmetric, medium voltage 11 Hz posterior dominant rhythm that attenuates with eye opening.  The record is symmetric.  During drowsiness and sleep, there is an increase in theta slowing of the background.  Vertex waves and symmetric sleep spindles were seen.  Photic stimulation did not elicit any abnormalities.  There were no epileptiform discharges or electrographic seizures seen.    EKG lead was unremarkable.  Impression: This awake and asleep EEG is normal.    Clinical Correlation: A normal EEG does not exclude a clinical diagnosis of epilepsy.  If further clinical questions remain, prolonged EEG may be helpful.  Clinical correlation is advised.   Metta Clines, DO

## 2021-02-25 ENCOUNTER — Ambulatory Visit
Admission: RE | Admit: 2021-02-25 | Discharge: 2021-02-25 | Disposition: A | Discharge status: Home / Self Care | Payer: Medicare Other | Source: Ambulatory Visit | Attending: Neurosurgery | Admitting: Neurosurgery

## 2021-02-25 DIAGNOSIS — D332 Benign neoplasm of brain, unspecified: Secondary | ICD-10-CM

## 2021-02-25 MED ORDER — GADOBUTROL 1 MMOL/ML IV SOLN: 10.0000 mL | Freq: Once | INTRAVENOUS | Status: DC | PRN

## 2021-03-11 ENCOUNTER — Ambulatory Visit
Admission: RE | Admit: 2021-03-11 | Discharge: 2021-03-11 | Disposition: A | Discharge status: Home / Self Care | Payer: Medicare Other | Source: Ambulatory Visit | Attending: Neurosurgery | Admitting: Neurosurgery

## 2021-03-11 ENCOUNTER — Other Ambulatory Visit: Payer: Self-pay

## 2021-03-11 MED ORDER — GADOBUTROL 1 MMOL/ML IV SOLN
10.0000 mL | Freq: Once | INTRAVENOUS | Status: AC | PRN
  Administered 2021-03-11: 10 mL via INTRAVENOUS

## 2021-04-07 ENCOUNTER — Telehealth: Payer: Self-pay | Admitting: Neurology

## 2021-04-07 NOTE — Telephone Encounter (Signed)
Pt said he was having severe migraines, sleepy and fatigue, eye get droopy where his tumor is. This has been going on 3 weeks now. His eye started this past Saturday.

## 2021-04-07 NOTE — Telephone Encounter (Signed)
How frequent or the headaches (on average, how many days a week/month are they occurring)?   How long do the headaches last?   Verify what preventative medication and dose you are taking (e.g. topiramate, propranolol, amitriptyline, Emgality, etc)   Verify which rescue medication you are taking (triptan, Advil, Excedrin, Aleve, Ubrelvy, etc)   How often are you taking pain relievers/analgesics/rescue mediction?    Called patient and left a message for a call back.

## 2021-04-08 NOTE — Telephone Encounter (Signed)
How frequent or the headaches (on average, how many days a week/month are they occurring)?  Everyday, More intense. Lazy and more nasuea How long do the headaches last?  Not Sure patient tries to sleep through them. He will wake up with them.  Verify what preventative medication and dose you are taking (e.g. topiramate, propranolol, amitriptyline, Emgality, etc)  NONE Verify which rescue medication you are taking (triptan, Advil, Excedrin, Aleve, Ubrelvy, etc)  NONE How often are you taking pain relievers/analgesics/rescue mediction?    For two days. Called patient and left a message for a call back.      Note   Per pt today he is feeling a little better. Pain location Right side where his tumor is.

## 2021-04-09 ENCOUNTER — Telehealth: Payer: Self-pay

## 2021-04-09 MED ORDER — SUMATRIPTAN SUCCINATE 100 MG PO TABS: ORAL_TABLET | ORAL | 5 refills | Status: DC

## 2021-04-09 NOTE — Telephone Encounter (Signed)
New message   Your information has been sent to [facility].  Donald Mayer [name] - PA Case ID: [id number] Need help? Call us at [phone] Status Sent to Plan today Drug Botox 200UNIT solution Form [facility] Medicare Part D Electronic Prior Authorization Form (2017 NCPDP)

## 2021-04-09 NOTE — Telephone Encounter (Signed)
Patient advised of Dr.Jaffe, 1. I want to resubmit/appeal for Botox - he has had over 15 headache days a month for more than 3 consecutive months and has failed propranolol, Depakote, amitriptyline and topiramate.  Is he no longer on amitriptyline?  2. We can refill sumatriptan if he hasn't been taking it (that was what he is supposed to be taking).  Limit use of pain relievers to no more than 2 days out of week to prevent risk of rebound or medication-overuse headache.   Sumatriptan refilled.  Jari Sportsman when you get a chance can you submit /Appeal for Botox?

## 2021-04-09 NOTE — Telephone Encounter (Signed)
F/u   Donald Mayer Key: [id number] - PA Case ID: [id number] Need help? Call us at [phone] Status Sent to Plan today Drug Botox 200UNIT solution Form [facility] Medicare Part D Electronic Prior Authorization Form (2017 NCPDP)

## 2021-05-15 NOTE — Progress Notes (Signed)
Virtual Visit via Video Note The purpose of this virtual visit is to provide medical care while limiting exposure to the novel coronavirus.    Consent was obtained for video visit:  Yes.   Answered questions that patient had about telehealth interaction:  Yes.   I discussed the limitations, risks, security and privacy concerns of performing an evaluation and management service by telemedicine. I also discussed with the patient that there may be a patient responsible charge related to this service. The patient expressed understanding and agreed to proceed.  Pt location: Home Physician Location: office Name of referring provider:  Francis Gaines, FNP I connected with Rune A Laidlaw at patients initiation/request on 05/16/2021 at  8:30 AM EST by video enabled telemedicine application and verified that I am speaking with the correct person using two identifiers. Pt MRN:  [id number] Pt DOB:  1985-08-11 Video Participants:  Tyrik A Bettendorf  Assessment and Plan:   1.  DNET 2.  Symptomatic localization-related epilepsy 3.  Chronic migraine without aura, without status migrainosus, not intractable - over 3 consecutive months of headache over 15 days per month, failed amitriptyline,    1.  Seizure prophylaxis: carbamazepine 400mg  twice daily.  Check CBC, CMP and carbamazepine level 2.  Migraine prophylaxis:  To optimize headache control, increase amitriptyline to 100mg  at bedtime 3.  Migraine rescue:  Sumatriptan and/or naproxen 4.  Check 72 hour ambulatory EEG with hope to capture one of his confusional episodes. 5.  No driving 6.  Limit use of pain relievers to no more than 2 days out of week to prevent risk of rebound or medication-overuse headache. 8.  Follow up 8 months.  History of Present Illness:  Donald Mayer is a 35 year old male who follows up for migraines, black out spells, and DNET.   UPDATE: Current medications: amitriptyline 75mg  at bedtime, sumatriptan 100mg ,  carbamazepine 400mg  BID  Not taking carbamazepine not sure -    He followed up with neurosurgery in August.  MRI of brain with and without contrast on 03/11/2021 personally reviewed showed mild extension of T2 hyperintensity along the posterior and medial resection cavity since August 2021 but no associated mass effect, suspicious diffusion changes or enhancement.  Stable.  Plan is to recheck in 6 months.    Last visit, he endorsed sudden confusion and forget what he is doing or what he has been talking about.  It occurs once or twice a week.  Routine EEG on 9/12022 was normal.  72 hour ambulatory EEG was recommended.  He said he didn't get a call to schedule.  Insurance still denied Botox, even though he met criteria for chronic migraine and had failed multiple preventatives such as Depakote, propranolol, topiramate and amitriptyline.  Migraines are improved.  They are severe, now once a week, lasts a couple of hours with sumatriptan.    He did follow up with neurosurgery and is going to have a repeat MRI next month.     For seizure prophylaxis, I started him on carbamazepine.  No seizures that he is aware of.  However, still has has sudden confusion and forget what he is doing or what he has been talking about.  It occurs once or twice a week.  I also wanted to start Botox for chronic migraine.  He filled out paperwork but nothing has been scheduled.       HISTORY: On 04/28/16, he had 2 episodes of syncope.  Afterwards, he developed a mild headache.  CT of  head from 04/30/16 revealed area of low attenuation in the right frontotemporal region.  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  He underwent a frontal craniotomy on 05/04/16 for resection of the tumor.  Postoperative MRI revealed postsurgical changes but no residual tumor.  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   EEG at that time was normal.  He was placed on Keppra as a prophylactic   Migraines started around this time.  They are right frontal, throbbing, 9-10/10 intensity and associated with blurred vision in right eye, nausea, photophobia, and phonophobia.  There is no preceding aura.  They last until he goes to sleep and occurs daily.  They are triggered or aggravated by light and sound and are relieved by sleep.  He has tried ibuprofen (takes daily) and Fioricet.  He is often unable to function.     He had another MRI of brain without contrast on 09/17/16, which revealed low level T2 and FLAIR signal around the region of resection which could represent sequelae of previous surgery, but could not exclude recurrent tumor.  He followed up with neurosurgery and is monitoring for now.  To further assess syncope, he had a 24 hour holter monitor in June 2018, which was unremarkable.   In October 2018, he started having black-out spells again.  Sometimes, it occurs when he is standing on his feet, such as in the shower or walking down the stairs.  He feels lightheaded and then passes out.  However, he also reports episodes while just sitting or reclining on the couch or bed.  He has no preceding warning.  He wakes up and notes a loss of time.  Family members have not reported any noticeable seizure-like activity if he is sleeping or unconscious on the couch.  It occurs once in a while.  EEG from 07/07/17 was normal.  He had a repeat MRI of brain with and without contrast on 08/11/17, which showed "slightly progressed T2/FLAIR signal abnormality along the superior margin of the right frontal resection cavity, suspicious for possible locally recurrent disease.  No significant mass effect."  He followed up with his neurosurgeon, Dr. Kathyrn Sheriff, who recommended continued monitoring.  Repeat MRI of brain without contrast on 10/24/18 showed "minimal progression of T2/FLAIR signal abnormality along the margins of the right frontal  resection cavity which may reflect recurrent tumor."  Contrast was not administered due to inability to obtain IV access.  Plan is to try and resect the recurrent tumor within the next 6 months but would like to get the seizures under control first.  Repeat MRI of brain with and without contrast on 02/06/2020 showed stable postoperative changes. MRI of brain with and without contrast on 09/25/2020 showed slight increase in T2/FLAIR hyperintensity along the posterior/superior aspect of   Past medications:  Depakote (worsened headaches), Keppra 750mg  twice daily (irritability); amitriptyline; propranolol 40mg  twice daily (increased migraines), Aimovig (unable to afford due to insurance), topiramate 150mg  at bedtime (cognitive changes)  Past Medical History: Past Medical History:  Diagnosis Date   Back injuries    Seizure ([place])     Medications: Outpatient Encounter Medications as of 05/16/2021  Medication Sig Note   amitriptyline (ELAVIL) 75 MG tablet TAKE 1 TABLET BY MOUTH AT BEDTIME    carbamazepine (TEGRETOL) 200 MG tablet Take 1 tablet twice daily for one week, then 1 tablet in  morning and 2 tablets at bedtime for one week, then 2 tablets twice daily.    dexamethasone (DECADRON) 6 MG tablet Take 6 mg by mouth 2 (two) times daily with a meal. (Patient not taking: Reported on 12/24/2020) 03/20/2020: Havent had evening dose    diazepam (VALIUM) 5 MG tablet Take 1 tab 30-min prior to MRI.  OK to repeat at facility, if needed.  Do not drive. (Patient not taking: Reported on 12/24/2020)    Erenumab-aooe (AIMOVIG) 70 MG/ML SOAJ Inject 70 mg into the skin every 30 (thirty) days. (Patient not taking: No sig reported) 10/26/2019: Unable to get due to insurance issues   Lacosamide 100 MG TABS Take 1/2 tablet twice daily for 7 days, then increase to 1 tablet twice daily. (Patient not taking: No sig reported)    LORazepam (ATIVAN) 1 MG tablet SMARTSIG:1-2 Tablet(s) By Mouth (Patient not taking: No sig reported)     propranolol (INDERAL) 40 MG tablet Take 1 tablet (40 mg total) by mouth 2 (two) times daily. (Patient not taking: No sig reported) 10/26/2019: Unable to get due to insurance issues   SUMAtriptan (IMITREX) 100 MG tablet Take 1 tablet earliest onset of migraine.  May repeat in 2 hours if headache persists or recurs.  Maximum 2 tablets in 24 hours.    topiramate (TOPAMAX) 50 MG tablet Take 3 tablets (150 mg total) by mouth at bedtime. (Patient not taking: No sig reported)    No facility-administered encounter medications on file as of 05/16/2021.    Allergies: Allergies  Allergen Reactions   Multihance [Gadobenate] Nausea And Vomiting    **Contrast**   Contrast Media [Iodinated Diagnostic Agents]     Family History: Family History  Problem Relation Age of Onset   Breast cancer Maternal Grandmother    Lung cancer Maternal Grandfather    Brain cancer Neg Hx     Observations/Objective:   Height _0  (1.88 m), weight (!) 410 lb (186 kg). No acute distress.  Alert and oriented.  Speech fluent and not dysarthric.  Language intact.     Follow Up Instructions:    -I discussed the assessment and treatment plan with the patient. The patient was provided an opportunity to ask questions and all were answered. The patient agreed with the plan and demonstrated an understanding of the instructions.   The patient was advised to call back or seek an in-person evaluation if the symptoms worsen or if the condition fails to improve as anticipated.  Dudley Major, DO

## 2021-05-16 ENCOUNTER — Other Ambulatory Visit: Payer: Self-pay

## 2021-05-16 ENCOUNTER — Telehealth (INDEPENDENT_AMBULATORY_CARE_PROVIDER_SITE_OTHER): Payer: Medicare Other | Admitting: Neurology

## 2021-05-16 ENCOUNTER — Encounter: Payer: Self-pay | Admitting: Neurology

## 2021-05-16 VITALS — Ht 74.0 in | Wt >= 6400 oz

## 2021-05-16 DIAGNOSIS — D332 Benign neoplasm of brain, unspecified: Secondary | ICD-10-CM

## 2021-05-16 DIAGNOSIS — Z79899 Other long term (current) drug therapy: Secondary | ICD-10-CM | POA: Diagnosis not present

## 2021-05-16 DIAGNOSIS — G43009 Migraine without aura, not intractable, without status migrainosus: Secondary | ICD-10-CM | POA: Diagnosis not present

## 2021-05-16 DIAGNOSIS — G40209 Localization-related (focal) (partial) symptomatic epilepsy and epileptic syndromes with complex partial seizures, not intractable, without status epilepticus: Secondary | ICD-10-CM | POA: Diagnosis not present

## 2021-05-16 MED ORDER — AMITRIPTYLINE HCL 100 MG PO TABS: 100.0000 mg | ORAL_TABLET | Freq: Every day | ORAL | 5 refills | Status: DC

## 2021-05-16 NOTE — Addendum Note (Signed)
Addended by: Venetia Night on: 05/16/2021 09:05 AM   Modules accepted: Orders

## 2021-05-16 NOTE — Patient Instructions (Signed)
Increase amitriptyline to 100mg  at bedtime Continue carbamazepine 400mg  twice daily Sumatriptan for migraine as needed Limit use of pain relievers to no more than 2 days out of week to prevent risk of rebound or medication-overuse headache. 72 hour ambulatory EEG.  Get labs done when you come in Follow up 8 months.

## 2021-05-26 ENCOUNTER — Telehealth: Payer: Self-pay | Admitting: *Deleted

## 2021-05-26 NOTE — Telephone Encounter (Signed)
Unable to leave a voicemail for setting up 72 hour ambulatory EEG because his voice mailbox is full.

## 2021-07-01 ENCOUNTER — Telehealth: Payer: Self-pay | Admitting: Neurology

## 2021-07-01 NOTE — Telephone Encounter (Signed)
Pt does want to have his 3 hour EEG Manuela Schwartz was informed she is going to call him and get it scheduled

## 2021-07-01 NOTE — Telephone Encounter (Signed)
Patient called to get his 72hr ambulatory eeg sch. He doesn't want to have it done here.

## 2021-07-04 ENCOUNTER — Ambulatory Visit: Payer: Medicare Other | Admitting: Neurology

## 2021-08-01 ENCOUNTER — Other Ambulatory Visit: Payer: Self-pay

## 2021-08-01 ENCOUNTER — Ambulatory Visit (INDEPENDENT_AMBULATORY_CARE_PROVIDER_SITE_OTHER): Payer: Medicare Other | Admitting: Neurology

## 2021-08-01 DIAGNOSIS — G40209 Localization-related (focal) (partial) symptomatic epilepsy and epileptic syndromes with complex partial seizures, not intractable, without status epilepticus: Secondary | ICD-10-CM

## 2021-08-06 NOTE — Procedures (Addendum)
ELECTROENCEPHALOGRAM REPORT  Dates of Recording: 08/01/2021 at 11:21 AM to 08/04/2021 at 11:57 AM  Patient's Name: Donald Mayer MRN: [id number] Date of Birth: May 31, 1986  Procedure: 67-hour and 50 minute ambulatory EEG  History: 36 year old male with right frontal lobe DNET with migraines and episodes of loss of awareness  Medications: Amitriptyline, carbamazepine, sumatriptan  Technical Summary: This is a 67-hour and 50 minute multichannel digital EEG recording measured by the international 10-20 system with electrodes applied with paste and impedances below 5000 ohms performed as portable with EKG monitoring.  The digital EEG was referentially recorded, reformatted, and digitally filtered in a variety of bipolar and referential montages for optimal display.    DESCRIPTION OF RECORDING: During maximal wakefulness, the background activity consisted of a symmetric 9-10Hz  posterior dominant rhythm which was reactive to eye opening.  There were no epileptiform discharges or focal slowing seen in wakefulness.  During the recording, the patient progresses through wakefulness, drowsiness, and Stage 2 sleep.  Again, there were no epileptiform discharges seen.  Events:  On 08/03/2021 at 9:59 AM, patient reported episode of "memory loss".  Video was not operating and therefore unavailable.  There were no electrographic seizures seen.  EKG lead was unremarkable.  IMPRESSION: This 67-hour and 50 minute ambulatory EEG study is normal.    CLINICAL CORRELATION: one habitual spell was captured on EEG with no electrographic correlate.  This is indicative of a non-epileptic event.     Metta Clines, DO

## 2021-08-25 ENCOUNTER — Other Ambulatory Visit: Payer: Self-pay | Admitting: Neurosurgery

## 2021-08-25 DIAGNOSIS — D332 Benign neoplasm of brain, unspecified: Secondary | ICD-10-CM

## 2021-09-04 ENCOUNTER — Other Ambulatory Visit (HOSPITAL_COMMUNITY): Payer: Self-pay | Admitting: Neurosurgery

## 2021-09-04 DIAGNOSIS — D332 Benign neoplasm of brain, unspecified: Secondary | ICD-10-CM

## 2021-09-24 ENCOUNTER — Other Ambulatory Visit: Payer: Self-pay | Admitting: Neurosurgery

## 2021-09-24 DIAGNOSIS — D332 Benign neoplasm of brain, unspecified: Secondary | ICD-10-CM

## 2022-02-03 NOTE — Progress Notes (Deleted)
NEUROLOGY FOLLOW UP OFFICE NOTE  Donald Mayer [id number]  Assessment/Plan:   1.  DNET 2.  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  Chronic migraine without aura, without status migrainosus, not intractable - over 3 consecutive months of headache over 15 days per month, failed amitriptyline,    1.  Seizure prophylaxis: carbamazepine '400mg'$  twice daily.  Check CBC, CMP and carbamazepine level 2.  Migraine prophylaxis:  amitriptyline to '100mg'$  at bedtime 3.  Migraine rescue:  Sumatriptan and/or naproxen 4.  No driving 5.  Limit use of pain relievers to no more than 2 days out of week to prevent risk of rebound or medication-overuse headache. 6. ***    Subjective:  Donald Mayer is a 36 year old male who follows up for migraines, black out spells, and DNET.   UPDATE: Current medications: amitriptyline '100mg'$  at bedtime, sumatriptan '100mg'$ , carbamazepine '400mg'$  BID  Last visit in December, continued to have confusional spells.  Labs, including carbamazepine level, were ordered but never performed.  He had a 3 day ambulatory EEG in February which was normal.  He had one reported episode of "memory loss" but without electrographic correlate.     To optimize migraine control, amitriptyline was increased to '100mg'$  at bedtime.  ***  HISTORY: On 04/28/16, he had 2 episodes of syncope.  Afterwards, he developed a mild headache.  CT of head from 04/30/16 revealed area of low attenuation in the right frontotemporal region.  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  He underwent a frontal craniotomy on 05/04/16 for resection of the tumor.  Postoperative MRI revealed postsurgical changes but no residual tumor.  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  EEG at that time was normal.  He was placed on Keppra as a prophylactic   Migraines started  around this time.  They are right frontal, throbbing, 9-10/10 intensity and associated with blurred vision in right eye, nausea, photophobia, and phonophobia.  There is no preceding aura.  They last until he goes to sleep and occurs daily.  They are triggered or aggravated by light and sound and are relieved by sleep.  He has tried ibuprofen (takes daily) and Fioricet.  He is often unable to function.     He had another MRI of brain without contrast on 09/17/16, which revealed low level T2 and FLAIR signal around the region of resection which could represent sequelae of previous surgery, but could not exclude recurrent tumor.  He followed up with neurosurgery and is monitoring for now.  To further assess syncope, he had a 24 hour holter monitor in June 2018, which was unremarkable.   In October 2018, he started having black-out spells again.  Sometimes, it occurs when he is standing on his feet, such as in the shower or walking down the stairs.  He feels lightheaded and then passes out.  However, he also reports episodes while just sitting or reclining on the couch or bed.  He has no preceding warning.  He wakes up and notes a loss of time.  Family members have not reported any noticeable seizure-like activity if he is sleeping or unconscious on the couch.  It occurs once in a while.  EEG from 07/07/17 was normal.   He began having increased frequency of confusional episodes where he would forget what he is doing or what he  has been talking about.  Routine EEG on 9/12022 was normal.  Repeat MRIs showed progression of the DNET but wanted to get seizures under better control before undergoing resection.    Past medications:  Depakote (worsened headaches), Keppra '750mg'$  twice daily (irritability); amitriptyline; propranolol '40mg'$  twice daily (increased migraines), Aimovig (unable to afford due to insurance), topiramate '150mg'$  at bedtime (cognitive changes).  Insurance denied Botox.  PAST MEDICAL HISTORY: Past  Medical History:  Diagnosis Date   Back injuries    Seizure [facility])     MEDICATIONS: Current Outpatient Medications on File Prior to Visit  Medication Sig Dispense Refill   amitriptyline (ELAVIL) 100 MG tablet Take 1 tablet (100 mg total) by mouth at bedtime. 30 tablet 5   carbamazepine (TEGRETOL) 200 MG tablet Take 1 tablet twice daily for one week, then 1 tablet in morning and 2 tablets at bedtime for one week, then 2 tablets twice daily. (Patient not taking: Reported on 05/16/2021) 120 tablet 0   diazepam (VALIUM) 5 MG tablet Take 1 tab 30-min prior to MRI.  OK to repeat at facility, if needed.  Do not drive. (Patient not taking: Reported on 12/24/2020) 2 tablet 0   Lacosamide 100 MG TABS Take 1/2 tablet twice daily for 7 days, then increase to 1 tablet twice daily. (Patient not taking: Reported on 03/20/2020) 60 tablet 0   LORazepam (ATIVAN) 1 MG tablet SMARTSIG:1-2 Tablet(s) By Mouth (Patient not taking: No sig reported)     SUMAtriptan (IMITREX) 100 MG tablet Take 1 tablet earliest onset of migraine.  May repeat in 2 hours if headache persists or recurs.  Maximum 2 tablets in 24 hours. 10 tablet 5   No current facility-administered medications on file prior to visit.    ALLERGIES: Allergies  Allergen Reactions   Multihance [Gadobenate] Nausea And Vomiting    **Contrast**   Contrast Media [Iodinated Contrast Media]     FAMILY HISTORY: Family History  Problem Relation Age of Onset   Breast cancer Maternal Grandmother    Lung cancer Maternal Grandfather    Brain cancer Neg Hx       Objective:  *** General: No acute distress.  Patient appears ***-groomed.   Head:  Normocephalic/atraumatic Eyes:  Fundi examined but not visualized Neck: supple, no paraspinal tenderness, full range of motion Heart:  Regular rate and rhythm Lungs:  Clear to auscultation bilaterally Back: No paraspinal tenderness Neurological Exam: alert and oriented to person, place, and time.  Speech fluent and  not dysarthric, language intact.  CN II-XII intact. Bulk and tone normal, muscle strength 5/5 throughout.  Sensation to light touch intact.  Deep tendon reflexes 2+ throughout, toes downgoing.  Finger to nose testing intact.  Gait normal, Romberg negative.   Metta Clines, DO  CC: ***

## 2022-02-04 ENCOUNTER — Ambulatory Visit: Payer: Medicare Other | Admitting: Neurology

## 2022-02-17 NOTE — Progress Notes (Unsigned)
NEUROLOGY FOLLOW UP OFFICE NOTE  Donald Mayer Herring [id number]  Assessment/Plan:   1.  DNET 2.  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  Migraine without aura, without status migrainosus, not intractable    1.  Seizure prophylaxis: restart carbamazepine titrating to '400mg'$  twice daily.  Check CBC, CMP.  Advised to contact us for refills. 2.  Migraine prophylaxis:  Hold off on restarting amitriptyline at this time.  If doing well in 4 weeks, we can restart it. 3.  Migraine rescue:  Sumatriptan and/or naproxen 4.  No driving 5.  Limit use of pain relievers to no more than 2 days out of week to prevent risk of rebound or medication-overuse headache. 6. Advised to contact neurosurgery's office regarding orders for medication to address contrast allergy as he needs repeat MRI of brain 7.  Follow up with me in 6 months.    Subjective:  Donald Mayer is a 36 year old male who follows up for migraines, black out spells, and DNET.   UPDATE: Current medications: amitriptyline '100mg'$  at bedtime, sumatriptan '100mg'$ , carbamazepine '400mg'$  BID  Last visit in December, continued to have confusional spells.  Labs, including carbamazepine level, were ordered but never performed.  He had a 3 day ambulatory EEG in February which was normal.  He had one reported episode of "memory loss" but without electrographic correlate.     To optimize migraine control, amitriptyline was increased to '100mg'$  at bedtime.    He hasn't been taking carbamazepine or amitriptyline for awhile because he ran out of refills.  He also is past due for brain MRI.  He has a contrast allergy but cannot get an MRI until orders for medication to prevent a reaction are entered.  He has not heard from anyone.    Reports migraines are improved - 1 to 2 a week, not severe.  Denies spells.  HISTORY: On 04/28/16, he had 2 episodes of syncope.  Afterwards, he developed a mild headache.  CT of head from 04/30/16  revealed area of low attenuation in the right frontotemporal region.  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  He underwent a frontal craniotomy on 05/04/16 for resection of the tumor.  Postoperative MRI revealed postsurgical changes but no residual tumor.  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  EEG at that time was normal.  He was placed on Keppra as a prophylactic   Migraines started around this time.  They are right frontal, throbbing, 9-10/10 intensity and associated with blurred vision in right eye, nausea, photophobia, and phonophobia.  There is no preceding aura.  They last until he goes to sleep and occurs daily.  They are triggered or aggravated by light and sound and are relieved by sleep.  He has tried ibuprofen (takes daily) and Fioricet.  He is often unable to function.     He had another MRI of brain without contrast on 09/17/16, which revealed low level T2 and FLAIR signal around the region of resection which could represent sequelae of previous surgery, but could not exclude recurrent tumor.  He followed up with neurosurgery and is monitoring for now.  To further assess syncope, he had a 24 hour holter monitor in June 2018, which was unremarkable.   In October 2018, he started having black-out spells again.  Sometimes, it occurs when he is standing on  his feet, such as in the shower or walking down the stairs.  He feels lightheaded and then passes out.  However, he also reports episodes while just sitting or reclining on the couch or bed.  He has no preceding warning.  He wakes up and notes a loss of time.  Family members have not reported any noticeable seizure-like activity if he is sleeping or unconscious on the couch.  It occurs once in a while.  EEG from 07/07/17 was normal.   He began having increased frequency of confusional episodes where he would forget what  he is doing or what he has been talking about.  Routine EEG on 9/12022 was normal.  Repeat MRIs showed progression of the DNET but wanted to get seizures under better control before undergoing resection.    Past medications:  Depakote (worsened headaches), Keppra '750mg'$  twice daily (irritability); amitriptyline; propranolol '40mg'$  twice daily (increased migraines), Aimovig (unable to afford due to insurance), topiramate '150mg'$  at bedtime (cognitive changes).  Insurance denied Botox.  PAST MEDICAL HISTORY: Past Medical History:  Diagnosis Date   Back injuries    Seizure [facility])     MEDICATIONS: Current Outpatient Medications on File Prior to Visit  Medication Sig Dispense Refill   amitriptyline (ELAVIL) 100 MG tablet Take 1 tablet (100 mg total) by mouth at bedtime. 30 tablet 5   carbamazepine (TEGRETOL) 200 MG tablet Take 1 tablet twice daily for one week, then 1 tablet in morning and 2 tablets at bedtime for one week, then 2 tablets twice daily. (Patient not taking: Reported on 05/16/2021) 120 tablet 0   diazepam (VALIUM) 5 MG tablet Take 1 tab 30-min prior to MRI.  OK to repeat at facility, if needed.  Do not drive. (Patient not taking: Reported on 12/24/2020) 2 tablet 0   Lacosamide 100 MG TABS Take 1/2 tablet twice daily for 7 days, then increase to 1 tablet twice daily. (Patient not taking: Reported on 03/20/2020) 60 tablet 0   LORazepam (ATIVAN) 1 MG tablet SMARTSIG:1-2 Tablet(s) By Mouth (Patient not taking: No sig reported)     SUMAtriptan (IMITREX) 100 MG tablet Take 1 tablet earliest onset of migraine.  May repeat in 2 hours if headache persists or recurs.  Maximum 2 tablets in 24 hours. 10 tablet 5   No current facility-administered medications on file prior to visit.    ALLERGIES: Allergies  Allergen Reactions   Multihance [Gadobenate] Nausea And Vomiting    **Contrast**   Contrast Media [Iodinated Contrast Media]     FAMILY HISTORY: Family History  Problem Relation Age of Onset    Breast cancer Maternal Grandmother    Lung cancer Maternal Grandfather    Brain cancer Neg Hx       Objective:  Blood pressure 118/82, pulse 82, height '6\' 2"'$  (1.88 m), weight (!) 419 lb (190.1 kg), SpO2 94 %. General: No acute distress.  Patient appears well-groomed.   Head:  Normocephalic/atraumatic Eyes:  Fundi examined but not visualized Neck: supple, no paraspinal tenderness, full range of motion Heart:  Regular rate and rhythm Neurological Exam: alert and oriented to person, place, and time.  Speech fluent and not dysarthric, language intact.  CN II-XII intact. Bulk and tone normal, muscle strength 5/5 throughout.  Sensation to light touch intact.  Deep tendon reflexes 2+ throughout, toes downgoing.  Finger to nose testing intact.  Gait normal, Romberg negative.   Metta Clines, DO  CC: Francis Gaines, FNP

## 2022-02-18 ENCOUNTER — Ambulatory Visit: Payer: Medicare Other | Admitting: Neurology

## 2022-02-18 ENCOUNTER — Other Ambulatory Visit (INDEPENDENT_AMBULATORY_CARE_PROVIDER_SITE_OTHER): Payer: Medicare Other

## 2022-02-18 ENCOUNTER — Encounter: Payer: Self-pay | Admitting: Neurology

## 2022-02-18 VITALS — BP 118/82 | HR 82 | Ht 74.0 in | Wt >= 6400 oz

## 2022-02-18 DIAGNOSIS — G40209 Localization-related (focal) (partial) symptomatic epilepsy and epileptic syndromes with complex partial seizures, not intractable, without status epilepticus: Secondary | ICD-10-CM | POA: Diagnosis not present

## 2022-02-18 DIAGNOSIS — D332 Benign neoplasm of brain, unspecified: Secondary | ICD-10-CM

## 2022-02-18 DIAGNOSIS — G43009 Migraine without aura, not intractable, without status migrainosus: Secondary | ICD-10-CM

## 2022-02-18 DIAGNOSIS — Z79899 Other long term (current) drug therapy: Secondary | ICD-10-CM | POA: Diagnosis not present

## 2022-02-18 LAB — COMPREHENSIVE METABOLIC PANEL
ALT: 21 U/L (ref 0–53)
AST: 24 U/L (ref 0–37)
Albumin: 3.8 g/dL (ref 3.5–5.2)
Alkaline Phosphatase: 106 U/L (ref 39–117)
BUN: 14 mg/dL (ref 6–23)
CO2: 27 mEq/L (ref 19–32)
Calcium: 9.3 mg/dL (ref 8.4–10.5)
Chloride: 100 mEq/L (ref 96–112)
Creatinine, Ser: 1.21 mg/dL (ref 0.40–1.50)
GFR: 78.5 mL/min (ref >60.00)
Glucose, Bld: 103 mg/dL — ABNORMAL HIGH (ref 70–99)
Potassium: 4.4 mEq/L (ref 3.5–5.1)
Sodium: 134 mEq/L — ABNORMAL LOW (ref 135–145)
Total Bilirubin: 0.3 mg/dL (ref 0.2–1.2)
Total Protein: 8.2 g/dL (ref 6.0–8.3)

## 2022-02-18 LAB — CBC
HCT: 41.3 % (ref 39.0–52.0)
Hemoglobin: 13.4 g/dL (ref 13.0–17.0)
MCHC: 32.5 g/dL (ref 30.0–36.0)
MCV: 83.2 fl (ref 78.0–100.0)
Platelets: 274 10*3/uL (ref 150.0–400.0)
RBC: 4.96 Mil/uL (ref 4.22–5.81)
RDW: 15.1 % (ref 11.5–15.5)
WBC: 6.3 10*3/uL (ref 4.0–10.5)

## 2022-02-18 MED ORDER — CARBAMAZEPINE 200 MG PO TABS: ORAL_TABLET | ORAL | 0 refills | Status: DC

## 2022-02-18 MED ORDER — SUMATRIPTAN SUCCINATE 100 MG PO TABS: ORAL_TABLET | ORAL | 5 refills | Status: AC

## 2022-02-18 NOTE — Patient Instructions (Addendum)
Restart carbamazepine increasing dose to 2 pills twice daily.  Check CBC and CMP Contact us when you need a refill and we can restart amitriptyline at that time as well Refilled sumatriptan to take when you get a migraine Contact Dr. Cleotilde Neer office Follow up 6 months.

## 2022-02-25 ENCOUNTER — Other Ambulatory Visit (HOSPITAL_COMMUNITY): Payer: Self-pay | Admitting: Neurosurgery

## 2022-02-25 DIAGNOSIS — D332 Benign neoplasm of brain, unspecified: Secondary | ICD-10-CM

## 2022-03-17 ENCOUNTER — Ambulatory Visit (HOSPITAL_COMMUNITY): Payer: Medicare Other

## 2022-03-27 ENCOUNTER — Ambulatory Visit (HOSPITAL_COMMUNITY)
Admission: RE | Admit: 2022-03-27 | Discharge: 2022-03-27 | Disposition: A | Discharge status: Home / Self Care | Payer: Medicare Other | Source: Ambulatory Visit | Attending: Neurosurgery | Admitting: Neurosurgery

## 2022-03-27 ENCOUNTER — Encounter (HOSPITAL_COMMUNITY): Payer: Self-pay

## 2022-03-27 DIAGNOSIS — D332 Benign neoplasm of brain, unspecified: Secondary | ICD-10-CM

## 2022-04-10 ENCOUNTER — Other Ambulatory Visit: Payer: Self-pay | Admitting: Neurology

## 2022-08-18 NOTE — Progress Notes (Unsigned)
NEUROLOGY FOLLOW UP OFFICE NOTE  Terius A Glo Herring IQ:[id number]  Assessment/Plan:   1.  Dysembryoplastic Neuroepithelial Tumor (DNET) 2.  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  Migraine without aura, without status migrainosus, not intractable    1.  Seizure prophylaxis: Restart carbamazepine, titrating to '400mg'$  twice daily.  Advised to contact us for refills 2.  Migraine prophylaxis:  not indicated 3.  Migraine rescue:  Sumatriptan and/or naproxen 4.  advised to follow up with neurosurgery to discuss alternative locations to get MRI 5.  Follow up with me in 6 months.     Subjective:  Kapil Whittenberg is a 37 year old male who follows up for migraines, black out spells, and DNET.   UPDATE: Current medications: sumatriptan.  No longer on carbamazepine because he didn't contact us for refills.  However, he denies seizures.  Headaches are manageable off amitriptyline.  Gets a mild headache once or twice a week.  Responds to sumatriptan.  May experience brief lightheadedness such as when standing in the shower looking up or if he is working out.  He no longer is working out now because of this.  He has gained weight and is unable to fit in the MRI machines.  Therefore, he has not had a repeat MRI.  HISTORY: On 04/28/16, he had 2 episodes of syncope.  Afterwards, he developed a mild headache.  CT of head from 04/30/16 revealed area of low attenuation in the right frontotemporal region.  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  He underwent a frontal craniotomy on 05/04/16 for resection of the tumor.  Postoperative MRI revealed postsurgical changes but no residual tumor.  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  EEG at that time was normal.  He was placed on Keppra as a prophylactic   Migraines started around this time.  They are right  frontal, throbbing, 9-10/10 intensity and associated with blurred vision in right eye, nausea, photophobia, and phonophobia.  There is no preceding aura.  They last until he goes to sleep and occurs daily.  They are triggered or aggravated by light and sound and are relieved by sleep.  He has tried ibuprofen (takes daily) and Fioricet.  He is often unable to function.     He had another MRI of brain without contrast on 09/17/16, which revealed low level T2 and FLAIR signal around the region of resection which could represent sequelae of previous surgery, but could not exclude recurrent tumor.  He followed up with neurosurgery and is monitoring for now.  To further assess syncope, he had a 24 hour holter monitor in June 2018, which was unremarkable.   In October 2018, he started having black-out spells again.  Sometimes, it occurs when he is standing on his feet, such as in the shower or walking down the stairs.  He feels lightheaded and then passes out.  However, he also reports episodes while just sitting or reclining on the couch or bed.  He has no preceding warning.  He wakes up and notes a loss of time.  Family members have not reported any noticeable seizure-like activity if he is sleeping or unconscious on the couch.  It occurs once in a while.  EEG from 07/07/17 was normal.   He began having increased frequency of confusional episodes where he would forget what he is doing  or what he has been talking about.  Routine EEG on 9/12022 was normal.  Repeat MRIs showed progression of the DNET but wanted to get seizures under better control before undergoing resection.  67 hour and 50 minute ambulatory EEG from 2/17-2/20/2023 was normal with one habitual spell captured without electrographic correlate.    Past medications:  Depakote (worsened headaches), Keppra '750mg'$  twice daily (irritability); amitriptyline; propranolol '40mg'$  twice daily (increased migraines), Aimovig (unable to afford due to insurance),  topiramate '150mg'$  at bedtime (cognitive changes).  Insurance denied Botox.  PAST MEDICAL HISTORY: Past Medical History:  Diagnosis Date   Back injuries    Seizure [facility])     MEDICATIONS: Current Outpatient Medications on File Prior to Visit  Medication Sig Dispense Refill   amitriptyline (ELAVIL) 100 MG tablet Take 1 tablet (100 mg total) by mouth at bedtime. 30 tablet 5   carbamazepine (TEGRETOL) 200 MG tablet Take 1 tablet twice daily for one week, then 1 tablet in morning and 2 tablets at bedtime for one week, then 2 tablets twice daily. (Patient not taking: Reported on 05/16/2021) 120 tablet 0   diazepam (VALIUM) 5 MG tablet Take 1 tab 30-min prior to MRI.  OK to repeat at facility, if needed.  Do not drive. (Patient not taking: Reported on 12/24/2020) 2 tablet 0   Lacosamide 100 MG TABS Take 1/2 tablet twice daily for 7 days, then increase to 1 tablet twice daily. (Patient not taking: Reported on 03/20/2020) 60 tablet 0   LORazepam (ATIVAN) 1 MG tablet SMARTSIG:1-2 Tablet(s) By Mouth (Patient not taking: No sig reported)     SUMAtriptan (IMITREX) 100 MG tablet Take 1 tablet earliest onset of migraine.  May repeat in 2 hours if headache persists or recurs.  Maximum 2 tablets in 24 hours. 10 tablet 5   No current facility-administered medications on file prior to visit.    ALLERGIES: Allergies  Allergen Reactions   Multihance [Gadobenate] Nausea And Vomiting    **Contrast**   Contrast Media [Iodinated Contrast Media]     FAMILY HISTORY: Family History  Problem Relation Age of Onset   Breast cancer Maternal Grandmother    Lung cancer Maternal Grandfather    Brain cancer Neg Hx       Objective:  Blood pressure 134/67, pulse 86, height '6\' 2"'$  (1.88 m), weight (!) 431 lb 12.8 oz (195.9 kg), SpO2 96 %. General: No acute distress.  Patient appears well-groomed.   Head:  Normocephalic/atraumatic Eyes:  Fundi examined but not visualized Neck: supple, no paraspinal tenderness, full  range of motion Heart:  Regular rate and rhythm Neurological Exam: alert and oriented to person, place, and time.  Speech fluent and not dysarthric, language intact.  CN II-XII intact. Bulk and tone normal, muscle strength 5/5 throughout.  Sensation to light touch intact.  Deep tendon reflexes 2+ throughout.  Finger to nose testing intact.  Gait normal, Romberg negative.   Metta Clines, DO  CC: Francis Gaines, FNP

## 2022-08-19 ENCOUNTER — Encounter: Payer: Self-pay | Admitting: Neurology

## 2022-08-19 ENCOUNTER — Ambulatory Visit (INDEPENDENT_AMBULATORY_CARE_PROVIDER_SITE_OTHER): Payer: Medicare Other | Admitting: Neurology

## 2022-08-19 VITALS — BP 134/67 | HR 86 | Ht 74.0 in | Wt >= 6400 oz

## 2022-08-19 DIAGNOSIS — D332 Benign neoplasm of brain, unspecified: Secondary | ICD-10-CM | POA: Diagnosis not present

## 2022-08-19 DIAGNOSIS — G43009 Migraine without aura, not intractable, without status migrainosus: Secondary | ICD-10-CM | POA: Diagnosis not present

## 2022-08-19 DIAGNOSIS — G40209 Localization-related (focal) (partial) symptomatic epilepsy and epileptic syndromes with complex partial seizures, not intractable, without status epilepticus: Secondary | ICD-10-CM

## 2022-08-19 MED ORDER — CARBAMAZEPINE 200 MG PO TABS: ORAL_TABLET | ORAL | 0 refills | Status: DC

## 2022-08-19 NOTE — Patient Instructions (Addendum)
Restart carbamazepine as directed.  Contact me for refill Sumatriptan Contact Dr. Berdie Ogren office about looking for an alternative place to get an MRI

## 2022-09-02 ENCOUNTER — Other Ambulatory Visit: Payer: Self-pay | Admitting: Neurology

## 2022-12-07 ENCOUNTER — Telehealth: Payer: Self-pay | Admitting: Neurology

## 2022-12-07 NOTE — Telephone Encounter (Signed)
Pt is calling stating that he is having a hard time getting a MRI done he has fluid on his brain and they are not trying to get him in and would like to see out office can assist with this matter.

## 2022-12-08 NOTE — Telephone Encounter (Signed)
He needs to discuss this with neurosurgery.  They are ordering and monitoring the MRIs.   Per patient he is scheduled now.

## 2022-12-22 ENCOUNTER — Telehealth: Payer: Self-pay

## 2022-12-22 NOTE — Telephone Encounter (Signed)
Patient called to advise Dr.Jaffe he had his MRI done at [facility] today.

## 2022-12-25 ENCOUNTER — Telehealth: Payer: Self-pay

## 2022-12-25 ENCOUNTER — Telehealth: Payer: Self-pay | Admitting: Neurology

## 2022-12-25 NOTE — Telephone Encounter (Signed)
Pt is calling in stating that he would like to see what his MRI results are that he had the early part of this month.  Pt state that a neurosurgeon has scheduled him to have surgery and he wants to know exactly what the results are and why he is being scheduled for surgery.

## 2022-12-25 NOTE — Telephone Encounter (Signed)
Advised patient to call the Neurosurgery and asked to be seen and go over results and discuss why the surgery is being done.

## 2022-12-25 NOTE — Telephone Encounter (Signed)
Called [facility] and requested images to be pushed into [facility]. Representative states a 20 minutes turn around time for the images as images had to be pushed from PACS to [facility]

## 2023-01-06 ENCOUNTER — Other Ambulatory Visit: Payer: Self-pay | Admitting: Radiation Therapy

## 2023-01-06 ENCOUNTER — Inpatient Hospital Stay
Admission: RE | Admit: 2023-01-06 | Discharge: 2023-01-06 | Disposition: A | Discharge status: Home / Self Care | Payer: Self-pay | Source: Ambulatory Visit | Attending: Internal Medicine | Admitting: Internal Medicine

## 2023-01-06 DIAGNOSIS — D496 Neoplasm of unspecified behavior of brain: Secondary | ICD-10-CM

## 2023-01-18 ENCOUNTER — Telehealth: Payer: Self-pay | Admitting: Neurology

## 2023-01-18 DIAGNOSIS — R2681 Unsteadiness on feet: Secondary | ICD-10-CM

## 2023-01-18 NOTE — Telephone Encounter (Signed)
Pt is calling to see if Dr. Everlena Cooper would prescribe him a shower chair since he is very off balance and do not want to hurt himself while taking a shower.

## 2023-01-19 ENCOUNTER — Encounter: Payer: Self-pay | Admitting: Neurology

## 2023-01-19 NOTE — Telephone Encounter (Signed)
Patient would like to have it sent to [facility] [phone].   Patient called [facility] and was advised, They said the have this portal. ([email or web address])website. You have to upload script, clinical chart notes, and patient demographics and insurance

## 2023-01-20 NOTE — Telephone Encounter (Signed)
Patient would like a call back concerning a shower chair

## 2023-01-20 NOTE — Telephone Encounter (Addendum)
Pt is calling in stating that the facility is needing supporting documents for the shower chair.  Pt stated that he would like to have a call back.

## 2023-01-20 NOTE — Telephone Encounter (Signed)
Shower chair order faxed to [facility].

## 2023-01-26 NOTE — Telephone Encounter (Signed)
LMOVM for patient, This is something that will need to be discussed for documentation purposes at his visit next month

## 2023-02-18 NOTE — Progress Notes (Signed)
Virtual Visit via Video Note  Consent was obtained for video visit:  Yes.   Answered questions that patient had about telehealth interaction:  Yes.   I discussed the limitations, risks, security and privacy concerns of performing an evaluation and management service by telemedicine. I also discussed with the patient that there may be a patient responsible charge related to this service. The patient expressed understanding and agreed to proceed.  Pt location: Home Physician Location: office Name of referring provider:  Normajean Glasgow, FNP I connected with Donald Mayer at patients initiation/request on 02/19/2023 at  8:50 AM EDT by video enabled telemedicine application and verified that I am speaking with the correct person using two identifiers. Pt MRN:  [id number] Pt DOB:  1986/03/06 Video Participants:  Donald Mayer   Assessment/Plan:    1.  Dysembryoplastic Neuroepithelial Tumor (DNET) 2.  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  Migraine without aura, without status migrainosus, not intractable    1.  Seizure prophylaxis: Carbamazepine 400mg  twice daily.  Check CBC and CMP 2.  Migraine prophylaxis:  not indicated  3.  Migraine rescue:  Sumatriptan 100mg  and/or naproxen  4.  Plan for tumor resection per neurosurgery 5.  Will order shower chair due to dizziness/visual impairment when standing in the shower.  He already had one fall. 6.  Follow up with me in 6 months.      Subjective:  Donald Mayer is a 37 year old male who follows up for migraines, black out spells, and DNET.   UPDATE: Current medications: carbamazepine 400mg  BID; sumatriptan 100mg ; naproxen.  No seizures. Migraines stable.  6-7 migraines a month.  Aborts quickly with sumatriptan.     Repeat MRI of brain with and without contrast on 12/22/2022 personally reviewed revealed tumor recurrence in and around the resection cavity in the inferolateral right frontal lobe.  The area of signal abnormality now measures approximately 5.5 cm AP x 3.8 cm transverse x 4.4 cm craniocaudal. The tumor recurrence is primarily within the resection cavity, resulting in minimal mass effect. Mild enhancement along the inferolateral margin of the resection site and recurrent tumor, primarily curvilinear, although mildly increased in thickness compared to prior. This is indeterminate, as the original tumor was nonenhancing. This may be non-neoplastic post-surgical enhancement.    He is going to need surgery again but waiting until Nov-Dec, after his baby girl is born  He frequently gets dizzy with visual impairment when he is standing in the shower.  He already had one fall where he had to go to the ED.  He is requesting a shower chair.    HISTORY: On 04/28/16, he had 2 episodes of syncope.  Afterwards, he developed a mild headache.  CT of head from 04/30/16 revealed area of low attenuation in the right frontotemporal region.  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  He underwent a frontal craniotomy on 05/04/16 for resection of the tumor.  Postoperative MRI revealed postsurgical changes but no residual tumor.  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  EEG at that time was normal.  He was placed on Keppra as a prophylactic   Migraines started around this time.  They are right frontal, throbbing, 9-10/10 intensity and associated with blurred vision in right eye, nausea, photophobia, and phonophobia.  There is no preceding aura.  They last until  he goes to sleep and occurs daily.  They are triggered or aggravated by light and sound and are relieved by sleep.  He has tried ibuprofen (takes daily) and Fioricet.  He is often unable to function.     He had another MRI of brain without contrast on 09/17/16, which revealed low level T2 and FLAIR signal around the region of  resection which could represent sequelae of previous surgery, but could not exclude recurrent tumor.  He followed up with neurosurgery and is monitoring for now.  To further assess syncope, he had a 24 hour holter monitor in June 2018, which was unremarkable.   In October 2018, he started having black-out spells again.  Sometimes, it occurs when he is standing on his feet, such as in the shower or walking down the stairs.  He feels lightheaded and then passes out.  However, he also reports episodes while just sitting or reclining on the couch or bed.  He has no preceding warning.  He wakes up and notes a loss of time.  Family members have not reported any noticeable seizure-like activity if he is sleeping or unconscious on the couch.  It occurs once in a while.  EEG from 07/07/17 was normal.   He began having increased frequency of confusional episodes where he would forget what he is doing or what he has been talking about.  Routine EEG on 9/12022 was normal.  Repeat MRIs showed progression of the DNET but wanted to get seizures under better control before undergoing resection.  67 hour and 50 minute ambulatory EEG from 2/17-2/20/2023 was normal with one habitual spell captured without electrographic correlate.     Past medications:  Depakote (worsened headaches), Keppra 750mg  twice daily (irritability); amitriptyline; propranolol 40mg  twice daily (increased migraines), Aimovig (unable to afford due to insurance), topiramate 150mg  at bedtime (cognitive changes).  Insurance denied Botox.  Past Medical History: Past Medical History:  Diagnosis Date   Back injuries    Seizure [facility])     Medications: Outpatient Encounter Medications as of 02/19/2023  Medication Sig   carbamazepine (TEGRETOL) 200 MG tablet Take 1 tablet twice daily for one week, then 1 tablet in morning and 2 tablets at bedtime for one week, then 2 tablets twice daily.   metFORMIN (GLUCOPHAGE) 500 MG tablet Take 500 mg by mouth 2 (two)  times daily.   SUMAtriptan (IMITREX) 100 MG tablet Take 1 tablet earliest onset of migraine.  May repeat in 2 hours if headache persists or recurs.  Maximum 2 tablets in 24 hours.   Vitamin D, Ergocalciferol, (DRISDOL) 1.25 MG (50000 UNIT) CAPS capsule Take 50,000 Units by mouth every 7 (seven) days.   No facility-administered encounter medications on file as of 02/19/2023.    Allergies: Allergies  Allergen Reactions   Multihance [Gadobenate] Nausea And Vomiting    **Contrast**   Contrast Media [Iodinated Contrast Media]     Family History: Family History  Problem Relation Age of Onset   Breast cancer Maternal Grandmother    Lung cancer Maternal Grandfather    Brain cancer Neg Hx     Observations/Objective:    No acute distress.  Alert and oriented.  Speech fluent and not dysarthric.  Language intact.     Follow Up Instructions:    -I discussed the assessment and treatment plan with the patient. The patient was provided an opportunity to ask questions and all were answered. The patient agreed with the plan and demonstrated an understanding of the instructions.  The patient was advised to call back or seek an in-person evaluation if the symptoms worsen or if the condition fails to improve as anticipated.   Cira Servant, DO

## 2023-02-19 ENCOUNTER — Telehealth (INDEPENDENT_AMBULATORY_CARE_PROVIDER_SITE_OTHER): Payer: Medicare Other | Admitting: Neurology

## 2023-02-19 ENCOUNTER — Encounter: Payer: Self-pay | Admitting: Neurology

## 2023-02-19 ENCOUNTER — Telehealth: Payer: Self-pay

## 2023-02-19 DIAGNOSIS — D332 Benign neoplasm of brain, unspecified: Secondary | ICD-10-CM

## 2023-02-19 DIAGNOSIS — Z79899 Other long term (current) drug therapy: Secondary | ICD-10-CM

## 2023-02-19 DIAGNOSIS — G43009 Migraine without aura, not intractable, without status migrainosus: Secondary | ICD-10-CM

## 2023-02-19 DIAGNOSIS — G40209 Localization-related (focal) (partial) symptomatic epilepsy and epileptic syndromes with complex partial seizures, not intractable, without status epilepticus: Secondary | ICD-10-CM | POA: Diagnosis not present

## 2023-02-19 MED ORDER — CARBAMAZEPINE 200 MG PO TABS: 400.0000 mg | ORAL_TABLET | Freq: Two times a day (BID) | ORAL | 5 refills | Status: DC

## 2023-02-19 NOTE — Telephone Encounter (Signed)
Per Dr.Jaffe Labs needed for medication management: CBC and CMP.  Patient advised he will go to his PCP to have labs drawn.    Faxed lab orders to PCP [phone]

## 2023-02-19 NOTE — Patient Instructions (Signed)
Carbamazepine 400mg  twice daily.  Check labs Sumatriptan as needed.

## 2023-04-06 ENCOUNTER — Other Ambulatory Visit: Payer: Self-pay | Admitting: Neurosurgery

## 2023-04-20 ENCOUNTER — Other Ambulatory Visit: Payer: Self-pay | Admitting: Neurosurgery

## 2023-05-18 ENCOUNTER — Inpatient Hospital Stay (HOSPITAL_COMMUNITY): Admit: 2023-05-18 | Payer: Medicare Other | Admitting: Neurosurgery

## 2023-05-18 SURGERY — CRANIOTOMY TUMOR EXCISION
Anesthesia: General | Laterality: Right

## 2023-07-02 NOTE — Progress Notes (Unsigned)
NEUROLOGY FOLLOW UP OFFICE NOTE  Donald Mayer [id number]  Assessment/Plan:   1.  Dysembryoplastic Neuroepithelial Tumor (DNET) 2.  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  Migraine without aura, without status migrainosus, not intractable    1.  Seizure prophylaxis: Carbamazepine 400mg  twice daily.  Check BMP. 2.  Migraine prophylaxis:  not indicated  3.  Migraine rescue:  Sumatriptan 100mg  and/or naproxen  4.  Plan for tumor resection per neurosurgery 5.  Follow up with me in 6 months.   Subjective:  Donald Mayer is a 38 year old male who follows up for migraines, black out spells, and DNET.   UPDATE: Current medications: carbamazepine 400mg  BID; sumatriptan 100mg ; naproxen.  No seizures. No recent headaches. He had a baby girl.  Doing well.    Followed up with neurosurgery in November.  As he was doing well, it was decided to hold off on surgery.  He has a follow up MRI and if there is any evidence of growth, then they will reconsider surgery.  HISTORY: On 04/28/16, he had 2 episodes of syncope.  Afterwards, he developed a mild headache.  CT of head from 04/30/16 revealed area of low attenuation in the right frontotemporal region.  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  He underwent a frontal craniotomy on 05/04/16 for resection of the tumor.  Postoperative MRI revealed postsurgical changes but no residual tumor.  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  EEG at that time was normal.  He was placed on Keppra as a prophylactic   Migraines started around this time.  They are right frontal, throbbing, 9-10/10 intensity and associated with blurred vision in right eye, nausea, photophobia, and phonophobia.  There is no preceding aura.  They last until he goes to sleep and occurs daily.  They are triggered or aggravated  by light and sound and are relieved by sleep.  He has tried ibuprofen (takes daily) and Fioricet.  He is often unable to function.     He had another MRI of brain without contrast on 09/17/16, which revealed low level T2 and FLAIR signal around the region of resection which could represent sequelae of previous surgery, but could not exclude recurrent tumor.  He followed up with neurosurgery and is monitoring for now.  To further assess syncope, he had a 24 hour holter monitor in June 2018, which was unremarkable.   In October 2018, he started having black-out spells again.  Sometimes, it occurs when he is standing on his feet, such as in the shower or walking down the stairs.  He feels lightheaded and then passes out.  However, he also reports episodes while just sitting or reclining on the couch or bed.  He has no preceding warning.  He wakes up and notes a loss of time.  Family members have not reported any noticeable seizure-like activity if he is sleeping or unconscious on the couch.  It occurs once in a while.  EEG from 07/07/17 was normal.   He began having increased frequency of confusional episodes where he would forget what he is doing or what he has been talking about.  Routine EEG on 9/12022 was normal.  Repeat MRIs showed progression of the DNET but wanted to get seizures under better control before undergoing resection.  67 hour and 50 minute ambulatory EEG from  2/17-2/20/2023 was normal with one habitual spell captured without electrographic correlate.    Repeat MRI of brain with and without contrast on 12/22/2022 demonstrated tumor recurrence:  tumor recurrence in and around the resection cavity in the inferolateral right frontal lobe. The area of signal abnormality now measures approximately 5.5 cm AP x 3.8 cm transverse x 4.4 cm craniocaudal. The tumor recurrence is primarily within the resection cavity, resulting in minimal mass effect. Mild enhancement along the inferolateral margin of the  resection site and recurrent tumor, primarily curvilinear, although mildly increased in thickness compared to prior. This is indeterminate, as the original tumor was nonenhancing. This may be non-neoplastic post-surgical enhancement.    Past medications:  Depakote (worsened headaches), Keppra 750mg  twice daily (irritability); amitriptyline; propranolol 40mg  twice daily (increased migraines), Aimovig (unable to afford due to insurance), topiramate 150mg  at bedtime (cognitive changes).  Insurance denied Botox.  PAST MEDICAL HISTORY: Past Medical History:  Diagnosis Date   Back injuries    Seizure [facility])     MEDICATIONS: Current Outpatient Medications on File Prior to Visit  Medication Sig Dispense Refill   carbamazepine (TEGRETOL) 200 MG tablet Take 2 tablets (400 mg total) by mouth in the morning and at bedtime. 120 tablet 5   metFORMIN (GLUCOPHAGE) 500 MG tablet Take 500 mg by mouth 2 (two) times daily.     SUMAtriptan (IMITREX) 100 MG tablet Take 1 tablet earliest onset of migraine.  May repeat in 2 hours if headache persists or recurs.  Maximum 2 tablets in 24 hours. 10 tablet 5   No current facility-administered medications on file prior to visit.    ALLERGIES: Allergies  Allergen Reactions   Multihance [Gadobenate] Nausea And Vomiting    **Contrast**   Contrast Media [Iodinated Contrast Media]     FAMILY HISTORY: Family History  Problem Relation Age of Onset   Breast cancer Maternal Grandmother    Lung cancer Maternal Grandfather    Brain cancer Neg Hx       Objective:  Blood pressure 138/67, pulse 75, height 6\' 2"  (1.88 m), weight (!) 445 lb (201.9 kg), SpO2 97%. General: No acute distress.  Patient appears well-groomed.   Head:  Normocephalic/atraumatic Eyes:  Fundi examined but not visualized Neck: supple, no paraspinal tenderness, full range of motion Heart:  Regular rate and rhythm Lungs:  Clear to auscultation bilaterally Back: No paraspinal  tenderness Neurological Exam: alert and oriented.  Speech fluent and not dysarthric, language intact.  CN II-XII intact. Bulk and tone normal, muscle strength 5/5 throughout.  Sensation to light touch intact.  Deep tendon reflexes 2+ throughout, toes downgoing.  Finger to nose testing intact.  Gait normal, Romberg negative.   Shon Millet, DO  CC: Rometta Emery, MD

## 2023-07-05 ENCOUNTER — Encounter: Payer: Self-pay | Admitting: Neurology

## 2023-07-05 ENCOUNTER — Ambulatory Visit (INDEPENDENT_AMBULATORY_CARE_PROVIDER_SITE_OTHER): Payer: Medicare Other | Admitting: Neurology

## 2023-07-05 ENCOUNTER — Other Ambulatory Visit: Payer: Self-pay

## 2023-07-05 ENCOUNTER — Other Ambulatory Visit: Payer: Medicare Other

## 2023-07-05 VITALS — BP 138/67 | HR 75 | Ht 74.0 in | Wt >= 6400 oz

## 2023-07-05 DIAGNOSIS — D332 Benign neoplasm of brain, unspecified: Secondary | ICD-10-CM

## 2023-07-05 DIAGNOSIS — G43009 Migraine without aura, not intractable, without status migrainosus: Secondary | ICD-10-CM | POA: Diagnosis not present

## 2023-07-05 DIAGNOSIS — G40209 Localization-related (focal) (partial) symptomatic epilepsy and epileptic syndromes with complex partial seizures, not intractable, without status epilepticus: Secondary | ICD-10-CM | POA: Diagnosis not present

## 2023-07-05 MED ORDER — CARBAMAZEPINE 200 MG PO TABS: 400.0000 mg | ORAL_TABLET | Freq: Two times a day (BID) | ORAL | 5 refills | Status: DC

## 2023-07-05 NOTE — Patient Instructions (Signed)
Continue carbamazepine 400mg  twice daily.  Check BMP Sumatriptan as needed for migraine

## 2023-07-05 NOTE — Addendum Note (Signed)
Addended by: Leida Lauth on: 07/05/2023 10:33 AM   Modules accepted: Orders

## 2023-07-06 LAB — BASIC METABOLIC PANEL
BUN: 13 mg/dL (ref 7–25)
CO2: 28 mmol/L (ref 20–32)
Calcium: 9 mg/dL (ref 8.6–10.3)
Chloride: 103 mmol/L (ref 98–110)
Creat: 1.2 mg/dL (ref 0.60–1.26)
Glucose, Bld: 99 mg/dL (ref 65–99)
Potassium: 4.3 mmol/L (ref 3.5–5.3)
Sodium: 138 mmol/L (ref 135–146)

## 2023-07-06 NOTE — Progress Notes (Signed)
LMOVM for patient to call the office back.

## 2023-08-19 ENCOUNTER — Ambulatory Visit: Payer: Medicare Other | Admitting: Neurology

## 2023-12-31 NOTE — Progress Notes (Signed)
 NEUROLOGY FOLLOW UP OFFICE NOTE  Donald Mayer [id number]  Assessment/Plan:   1.  Dysembryoplastic Neuroepithelial Tumor (DNET) 2.  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  Migraine without aura, without status migrainosus, not intractable    1.  Seizure prophylaxis: Carbamazepine  400mg  twice daily.  Check BMP. 2.  Migraine prophylaxis:  not indicated  3.  Migraine rescue:  Sumatriptan  100mg  4.  DNET managed by neurosurgery and neuro-oncology 5.  Follow up with me in 8 months.   Subjective:  Donald Mayer is a 38 year old male who follows up for migraines, black out spells, and DNET.   UPDATE: Current medications: carbamazepine  400mg  BID; sumatriptan  100mg ; naproxen .  No seizures. No recent headaches. He cancelled his last MRI but still needs to reschedule.    07/05/2023 LABS:  BMP with Na 138, K 4.3, CL 103, CO2 28, Ca 9, glucose 99, BUN 13, Cr 1.20  HISTORY: On 04/28/16, he had 2 episodes of syncope.  Afterwards, he developed a mild headache.  CT of head from 04/30/16 revealed area of low attenuation in the right frontotemporal region.  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  He underwent a frontal craniotomy on 05/04/16 for resection of the tumor.  Postoperative MRI revealed postsurgical changes but no residual tumor.  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  EEG at that time was normal.  He was placed on Keppra  as a prophylactic   Migraines started around this time.  They are right frontal, throbbing, 9-10/10 intensity and associated with blurred vision in right eye, nausea, photophobia, and phonophobia.  There is no preceding aura.  They last until he goes to sleep and occurs daily.  They are triggered or aggravated by light and sound and are relieved by sleep.  He has tried ibuprofen  (takes daily) and Fioricet .   He is often unable to function.     He had another MRI of brain without contrast on 09/17/16, which revealed low level T2 and FLAIR signal around the region of resection which could represent sequelae of previous surgery, but could not exclude recurrent tumor.  He followed up with neurosurgery and is monitoring for now.  To further assess syncope, he had a 24 hour holter monitor in June 2018, which was unremarkable.   In October 2018, he started having black-out spells again.  Sometimes, it occurs when he is standing on his feet, such as in the shower or walking down the stairs.  He feels lightheaded and then passes out.  However, he also reports episodes while just sitting or reclining on the couch or bed.  He has no preceding warning.  He wakes up and notes a loss of time.  Family members have not reported any noticeable seizure-like activity if he is sleeping or unconscious on the couch.  It occurs once in a while.  EEG from 07/07/17 was normal.   He began having increased frequency of confusional episodes where he would forget what he is doing or what he has been talking about.  Routine EEG on 9/12022 was normal.  Repeat MRIs showed progression of the DNET but wanted to get seizures under better control before undergoing resection.  67 hour and 50 minute ambulatory EEG from 2/17-2/20/2023 was normal with one habitual spell captured without electrographic correlate.    Repeat MRI of brain with and  without contrast on 12/22/2022 demonstrated tumor recurrence:  tumor recurrence in and around the resection cavity in the inferolateral right frontal lobe. The area of signal abnormality now measures approximately 5.5 cm AP x 3.8 cm transverse x 4.4 cm craniocaudal. The tumor recurrence is primarily within the resection cavity, resulting in minimal mass effect. Mild enhancement along the inferolateral margin of the resection site and recurrent tumor, primarily curvilinear, although mildly increased in thickness  compared to prior. This is indeterminate, as the original tumor was nonenhancing. This may be non-neoplastic post-surgical enhancement.    Followed up with neurosurgery in November 2024.  As he was doing well, it was decided to hold off on surgery.   Past medications:  Depakote  (worsened headaches), Keppra  750mg  twice daily (irritability); amitriptyline ; propranolol  40mg  twice daily (increased migraines), Aimovig  (unable to afford due to insurance), topiramate  150mg  at bedtime (cognitive changes).  Insurance denied Botox.  PAST MEDICAL HISTORY: Past Medical History:  Diagnosis Date   Back injuries    Seizure [facility])     MEDICATIONS: Current Outpatient Medications on File Prior to Visit  Medication Sig Dispense Refill   carbamazepine  (TEGRETOL ) 200 MG tablet Take 2 tablets (400 mg total) by mouth in the morning and at bedtime. 120 tablet 5   metFORMIN (GLUCOPHAGE) 500 MG tablet Take 500 mg by mouth 2 (two) times daily.     SUMAtriptan  (IMITREX ) 100 MG tablet Take 1 tablet earliest onset of migraine.  May repeat in 2 hours if headache persists or recurs.  Maximum 2 tablets in 24 hours. 10 tablet 5   No current facility-administered medications on file prior to visit.    ALLERGIES: Allergies  Allergen Reactions   Multihance  [Gadobenate] Nausea And Vomiting    **Contrast**   Contrast Media [Iodinated Contrast Media]     FAMILY HISTORY: Family History  Problem Relation Age of Onset   Breast cancer Maternal Grandmother    Lung cancer Maternal Grandfather    Brain cancer Neg Hx       Objective:  Blood pressure 125/79, pulse 75, height 6' 2 (1.88 m), weight (!) 457 lb (207.3 kg), SpO2 96%. General: No acute distress.  Patient appears well-groomed.   Head:  Normocephalic/atraumatic Neck:  Supple.  No paraspinal tenderness.  Full range of motion. Heart:  Regular rate and rhythm. Neuro:  Alert and oriented.  Speech fluent and not dysarthric.  Language intact.  CN II-XII intact.   Bulk and tone normal.  Muscle strength 5/5 throughout.  Sensation to light touch intact.  Deep tendon reflexes 2+ throughout, toes downgoing.  Gait normal.  Romberg negative.    Juliene Dunnings, DO  CC: Ozell Polite, MD

## 2024-01-03 ENCOUNTER — Ambulatory Visit: Payer: Medicare Other | Admitting: Neurology

## 2024-01-03 ENCOUNTER — Encounter: Payer: Self-pay | Admitting: Neurology

## 2024-01-03 VITALS — BP 125/79 | HR 75 | Ht 74.0 in | Wt >= 6400 oz

## 2024-01-03 DIAGNOSIS — D332 Benign neoplasm of brain, unspecified: Secondary | ICD-10-CM

## 2024-01-03 DIAGNOSIS — G40209 Localization-related (focal) (partial) symptomatic epilepsy and epileptic syndromes with complex partial seizures, not intractable, without status epilepticus: Secondary | ICD-10-CM | POA: Diagnosis not present

## 2024-01-03 DIAGNOSIS — G43009 Migraine without aura, not intractable, without status migrainosus: Secondary | ICD-10-CM

## 2024-01-03 MED ORDER — CARBAMAZEPINE 200 MG PO TABS: 400.0000 mg | ORAL_TABLET | Freq: Two times a day (BID) | ORAL | 11 refills | Status: AC

## 2024-01-03 NOTE — Patient Instructions (Signed)
 Continue carbamazepine  200mg  - 2 pills in morning and 2 pills at night Sumatriptan  100mg 

## 2024-05-08 ENCOUNTER — Other Ambulatory Visit: Payer: Self-pay | Admitting: Neurosurgery

## 2024-05-08 DIAGNOSIS — D332 Benign neoplasm of brain, unspecified: Secondary | ICD-10-CM

## 2024-09-04 ENCOUNTER — Ambulatory Visit: Admitting: Neurology
